# Patient Record
Sex: Male | Born: 1970 | Race: White | Hispanic: No | Marital: Single | Smoking: Current every day smoker
Health system: Southern US, Academic
[De-identification: ages and names within clinical notes are randomized; demographics above are authoritative.]

## PROBLEM LIST (undated history)

## (undated) DIAGNOSIS — I864 Gastric varices: Secondary | ICD-10-CM

## (undated) DIAGNOSIS — I219 Acute myocardial infarction, unspecified: Secondary | ICD-10-CM

## (undated) DIAGNOSIS — K709 Alcoholic liver disease, unspecified: Secondary | ICD-10-CM

## (undated) DIAGNOSIS — K449 Diaphragmatic hernia without obstruction or gangrene: Secondary | ICD-10-CM

## (undated) DIAGNOSIS — F1910 Other psychoactive substance abuse, uncomplicated: Secondary | ICD-10-CM

## (undated) DIAGNOSIS — K7460 Unspecified cirrhosis of liver: Secondary | ICD-10-CM

## (undated) DIAGNOSIS — K279 Peptic ulcer, site unspecified, unspecified as acute or chronic, without hemorrhage or perforation: Secondary | ICD-10-CM

## (undated) DIAGNOSIS — I10 Essential (primary) hypertension: Secondary | ICD-10-CM

## (undated) DIAGNOSIS — I8500 Esophageal varices without bleeding: Secondary | ICD-10-CM

## (undated) HISTORY — PX: SINUS SURGERY: SHX187

## (undated) HISTORY — PX: HX GALL BLADDER SURGERY/CHOLE: SHX55

## (undated) HISTORY — PX: NOSE SURGERY: SHX723

---

## 1995-08-12 ENCOUNTER — Other Ambulatory Visit (HOSPITAL_COMMUNITY): Payer: Self-pay

## 2002-12-16 ENCOUNTER — Emergency Department (HOSPITAL_COMMUNITY): Payer: Self-pay

## 2015-10-21 ENCOUNTER — Encounter (HOSPITAL_COMMUNITY): Payer: Self-pay

## 2015-10-21 NOTE — Ancillary Notes (Addendum)
[facility] Record for: Steven Parrish, Steven Parrish  Created: 10/21/2015 11:14:46 AM  MRN: [id number]  DOB: [birth year]  SSN: [id number]  Sex: Male  Height: 5 Feet 11 Inches - Weight: 199  Maiden Name:   Address:   [address]  [place]: [place], [place] [address]  E-Mail:   Day Phone: [phone] Night Phone:  Other Phone: [phone]  Phone comments:   Call Back time:   Authorized contact: no one  Intake Date: 10/21/2015 11:14:46 AM  PCP: Marcellus Scott   RefMD: Limmie Patricia, Ladonna   Primary Insurance: Medicaid - [place] [employer]  Secondary Insurance:   Insurance Comments: **Offer Mychart**     -------Referral Assignment------  Where was the original referral directed?: Spine Center  Was a specific reviewer requested?: Unassigned Referral  How was the reviewer selected?:   Who requested the specific reviewer?:   How did you hear of our spine program?:   Is this a second opinion?:      -------Web Challenge Info----------  [employer]:   City of Birth:      ---------- 1st Review ----------     Review Date: 10/30/2015 9:35:39 AM  Review Completed by: Anice Paganini  Impression: Neck Pain  Disposition: Other Treatment or Testing  Appt w/ Colleague:   Colleague Name:   Appt How Soon:   Pre Treatment Type: MRI  Pre Treatment Type Details: MRI Type: Cervical   Pre Treatment Other Test:   Appt Type:   Instructions: 45 yo male who notes midback-to-arm pain for 15 years.  MRi thoracic demonstrates old compression fracture consistent with his prolonged duration of symptoms.  This is non-surgical.  However with pain radiating into the arm I wonder if he actually has cervical radiculopathy radiating from periscapular region into the arm - MRI cervical would be useful.  I can re-review after the study.     ---------- 2nd Review ----------     Review Date: 11/07/2015 6:53:30 AM  Review Completed by: Anice Paganini  Impression: Neck Pain  Disposition: Appointment/Treatment with Colleague  Appt w/ Colleague: First Available  Appointment  Colleague Name: Pain Management  Appt How Soon:   Pre Treatment Type:   Pre Treatment Type Details:   Pre Treatment Other Test:   Appt Type:   Instructions: MRI cervical has no surgical pathology.  I recommend he consider injections through the pain clinic for his longstanding midback pain     ---------- Symptoms ----------     Chief complaint: 1/2 pack to 1 pack cigarettes a dayneck pain  Diagnosis from Other MD:      Symptoms: Pain,Numbness and/or tingling,Muscular weakness  Other Symptom Description:   Pain Location: Neck,Mid back,Arm  Other Pain Location Description:   Where is your pain the worst?:   Pain Type: Sharp/Stabbing  Pain Rating: 10  Does the pain radiate to other parts of your body? Yes   Radiate Where: From neck to left arm,Other  Other Description: "patient feels like pain radiates from mid back to left arm"  Does it radiate to the fingers?   Does it radiate below the elbow?   Which specific part of your arm?   Which fingers?   Which part of your arm does pain go to?   How does it radiate to the arm?   Does it radiate to the toes?   Does it radiate below the knee?   Which specific part of your leg?   Which toes?   Which part of your leg does  pain go to?   How does it radiate to the leg?   Additional pain information with no activity pain is about a 6with activity pain is 10neck pain; headaches; left arm pain, thoracic pain - pain is all about the samearm pain is aching pain,  neck and thoracic pain is sharp/stabbing pain     Location of Numbness/tingling: Arm  Other Description:  Does numbness/tingling radiate to other parts of your body?:Yes  Where does the numbess/tingling radiate?:From neck to left arm  Other Description:  Does the numbness/tingling radiate below your elbow?:Yes  Which specific part of your arm?:Left Wrist  Which fingers:  Which pat of your arm does the numbness/tingling go to?:  How does the numbness/tingling radiate to your arm?:  Does the numbness/tingling  radiate below your knee?:  Which specific part of your leg?:  Which toes:  Which part of your leg does the numbness/tingling go to?:  How does the numbness/tingling radiate to your leg?:  Additional Numbness/tingling information:"feel like numbness/tingling radiates all over left arm"  Other Description:      Location of Weakness: Right hand,Left hand  Other Description:   Additional Weakness Information: trouble with gripping items with bil handsno trouble with buttons, tying shoes, opening bottles or turning knobs     The symptoms have been present for: more than 1 year  The symptoms began: 15 years now  Was there a specific event that caused your symptoms?: Aggravation of a previous injury  Additional Narrative Description:   Are you able to perform your daily activities with these symptoms?:   Since what date have you been unable to perform your daily routine?:   The symptoms improve when you: Never improve  Other activities that improve your symptoms:   The symptoms worsen when you: Never worsen  Other activities that worsen your symptoms:      ---------- Work History ----------     Are you able to work?: Does not apply  Reasons for not working: Unemployed  Other Reason for not working:   Do you have Work Restrictions:   If applicable, maximum lifting restriction:   How long have you been unable to work:   Have you ever filed a W/C claim related to a neck or back injury?: no   Occupation:   Other Occupation Information:      ---------- Bowel/Bladder/Incontinence Issues ----------     Since the onset of symptoms, have you experienced any new problems urinating or having bowel movements?: No  Description:   Other Description:   How long have you had these bowel/bladder problems?:      ---------- Allergies ----------     Do you have any medication allergies? Yes  Allergies: Sulfa  Other Details:   Allergic to Latex?: NKA  Allergic to intravenous contrast dyes?: NKA  Allergic to steroids?: NKA     ----------  Treatment/Testing ----------      Taking prescription medication for this problem?: Yes  Are you using any now?: Yes  Medication: Lortab; MD Name: Marcellus Scott; Date Prescribed: ; Dosage: 10 mg; Times/Day: hasn't started just dropped off RX today  Have you received a Medrol dose pack for this problem?: Yes  When did you last take the dosepack?: last year  Were your symptoms improved?: No  Would you describe your relief as:   How long did you experience that amount of relief?:   Other Medrol dosepack information:      --------------- PT ---------------  PT: No  When Received:   Where Received:   Other where received information:   Visits:   Types:   Other Types:   Improved:      If improved, describe level of relief:   If improved, how long did you experience relief:   Other Physical Therapy Treatment Information:      ---------- Chiropractic Services ----------     Chiro: No  When:   Who:   Visits:   Types:   Other Types:   Were Symptoms Improved:   If improved, describe level of relief:   If improved, how long did you experience relief:   Other Chiropractic Treatment Information:      --------------- ESI ---------------     ESI: No  When:   Who:   Visits:   Types:   Improved:   If improved, describe level of relief:   If improved, how long did you experience relief:   Other Injection Treatment Information:      ---------- Diagnostic Tests ----------     MRI [facility] On:10/30/15 [facility]  Area Scanned: Cervical  Do you have any of the following in case an MRI is ordered?:   Other MRI factors:      ---------- Past Medical History ----------     What other doctors/providers have treated you for these spine issues? Marcellus ScottBowling DO, Ladonna Specialty: Family Medicine Date: 10/2015    Ever diagnosed with Spine deformity?:   Prior neck or back surgery (1)?: No  When:   Who:   Area of neck/spine operated on:   Level:   Were symptoms improved?:   If improved, describe level of relief:  If improved, how  long did you experience relief?:   Prior neck or back surgery (2)?:   When:   Who:   Area of neck/spine operated on:   Level:   Were symptoms improved?:   If improved, describe level of relief:  If improved, how long did you experience relief?:   Prior neck or back surgery (3)?:   When:   Who:   Area of neck/spine operated on:   Level:   Were symptoms improved?:   If improved, describe level of relief:  If improved, how long did you experience relief?:   Do you have any of the following assistive devices? None  How long have you required these assistive devices?   Currently being treated for any other medical condition?: Yes  Conditions: Hypertension  Other Conditions:   Type of neurologic disorder:   Cancer Type:   Other Treatment/Medication: Norvasc  What blood thinners are you currently taking?: None  Which physicians are treating you for your other medical conditions?: Bowling DO,Ladonna  Do you smoke?: Yes  Are you pre-menopausal or post-menopausal?:   If recommended, are you willing to consider surgery?: Yes  What is your goal in seeking treatment?: to get pain relief and better quality of life  Other pertinent information/ general comments/ goals for treatment: office note and report on file/ DBraggno hx of cano other studies     ---------- Care Coordinator Information ----------     Care Coordinator: RS  Activity Log: 10/30/2015 10:51:11 AM Misty Stanley[Lisa Herod]:  Attempted to contact patient to discuss MD initial impression and recommendations.  No answer and no machine. Received message "Voicemail not set up yet."  Marcelina MorelLisa Herod, RN9/20/2017 1:16:47 PM Misty Stanley[Lisa Herod]: Phone call to patient. Reviewed patient's medical history.  Patient states no change in symptoms.  Discussed MD initial  impression and recommendations:  MRI cervical would be useful.  I (Dr. Rod Holler) can re-review after the study.   Provided education on 45 yo male who notes midback-to-arm pain for 15 years.  MRI thoracic demonstrates old compression  fracture consistent with his prolonged duration of symptoms.  This is non-surgical.  However with pain radiating into the arm I wonder if he actually has cervical radiculopathy radiating from periscapular region into the arm.  Patient chose to accept recommendations.  States he just completed cervical MRI today at [facility].  Will request images and set for re-review once received.  L. Herod RN9/28/2017 8:57:41 AM Misty Stanley Herod]: Phone call to patient. Reviewed patient's medical history.  Patient states no change in symptoms.  Discussed MD initial impression and recommendations:  I (Dr. Rod Holler) recommend he consider injections through the pain clinic for his longstanding midback pain.  Provided education on MRI cervical has no surgical pathology.  Patient chose to decline recommendations.  States he is not doing any spine injections.   Explained that a letter will be faxed to the PCP/referring physician regarding this conversation. Patient verbalized understanding. Marcelina Morel, RN     Letter/Test Coordinator: Letters  Letter/Test Coordinator Log: 10/21/2015 12:16:53 PM Elnita Maxwell Chidester]: Completed intake.  Transferred patient to Eunice Blase in  pre-reg to update insurance information. CChidester. 11/01/2015 10:53:46 AM [Dawn Bragg]: Looks like images were uploaded to [employer] but no report. I called and spoke with Alexander Bergeron at [facility] and they stated MRI report was not ready yet. Will try again Monday. DBragg  11/07/2015 9:32:21 AM Alvis Lemmings Bragg]: Declined appointment letter faxed to Dr. Suzette Battiest. DBragg     Intake Specialist Comments:      Clinic Staff Comments:      Last Edited byJerilynn Birkenhead on 11/14/2015 2:09:02 PM  Last Review by: Anice Paganini on

## 2020-04-25 ENCOUNTER — Other Ambulatory Visit (HOSPITAL_COMMUNITY): Payer: Self-pay

## 2020-04-25 LAB — EXTERNAL COVID-19 MOLECULAR RESULT: External 2019-n-CoV/SARS-CoV-2: POSITIVE — AB

## 2020-08-17 IMAGING — MR MRI KNEE LT W/O CONTRAST
4 series · 26 of 40 positions shown · IV contrast (gadolinium)
Comparison: None available.

﻿EXAM:  [id number]   MRI KNEE LT W/O CONTRAST
INDICATION: Pain and instability.
TECHNIQUE: Multiplanar multisequential MRI of the left knee joint was performed without gadolinium contrast.

[Series 5: PD fat-sat · axial · left · 4.0mm · 0.37mm/px · z∈[-114,+16]mm · 11 of 30 slices shown (1 of 2)]
[im 1/30]
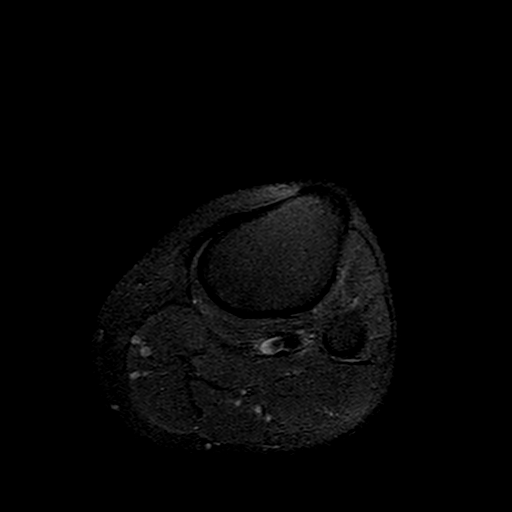
[im 3/30]
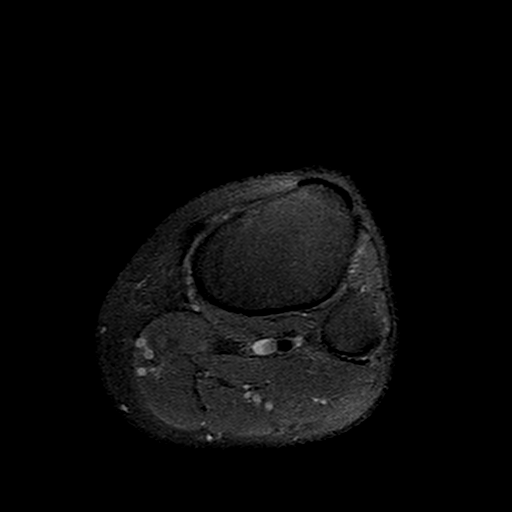
[im 6/30]
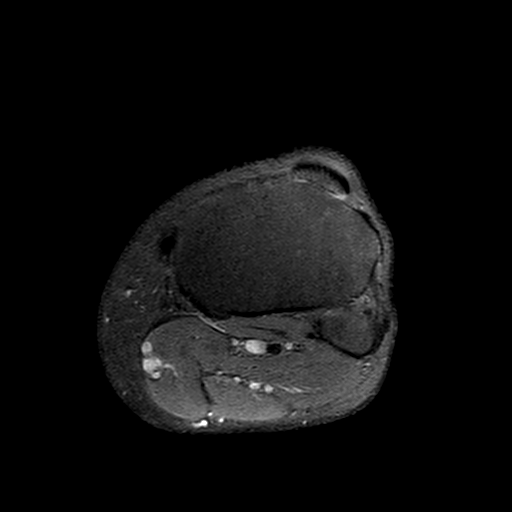
[im 9/30]
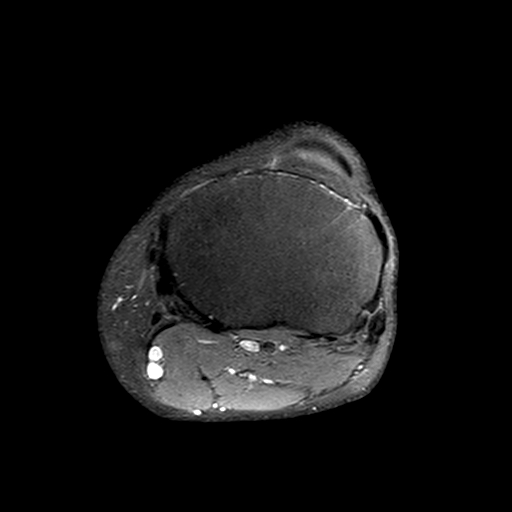
[im 12/30]
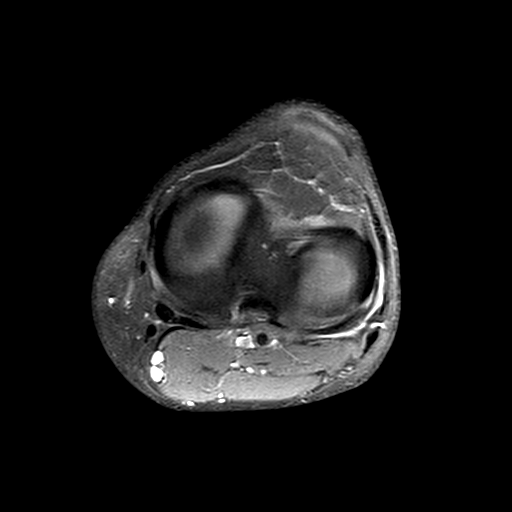
[im 15/30]
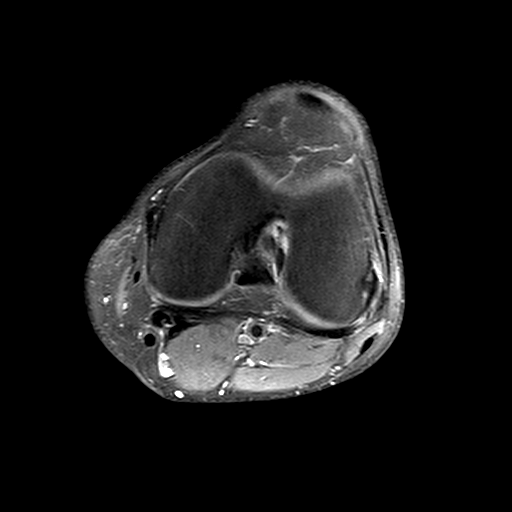
[im 18/30]
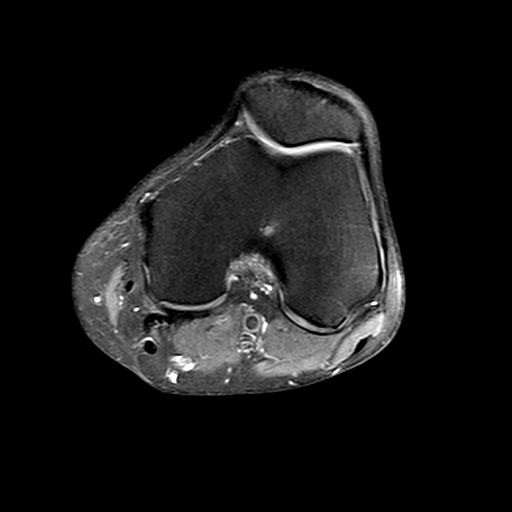
[im 21/30]
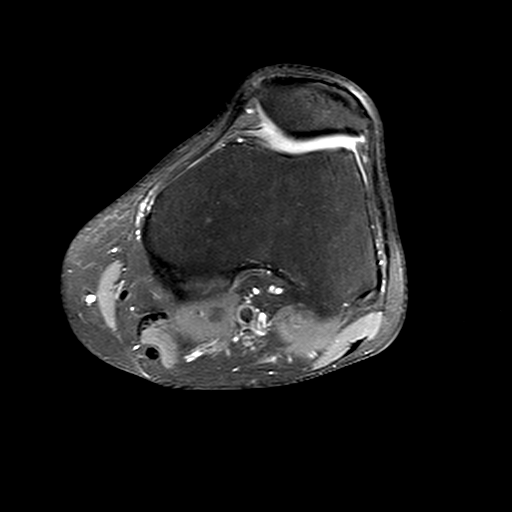
[im 24/30]
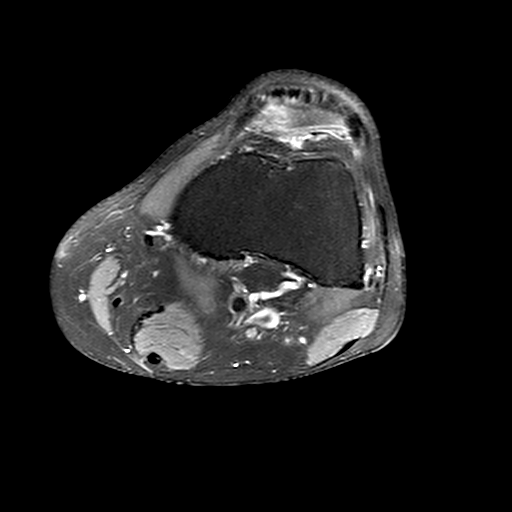
[im 27/30]
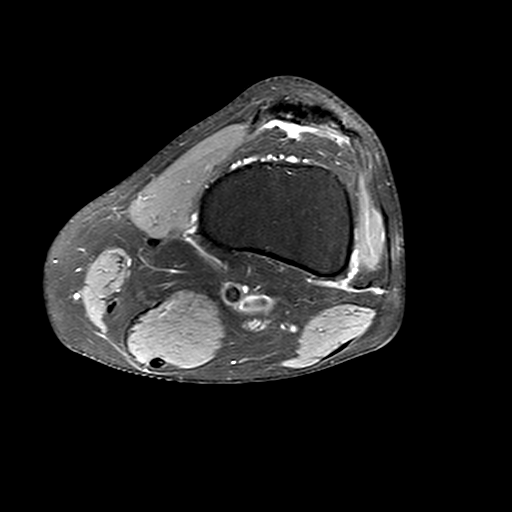
[im 30/30]
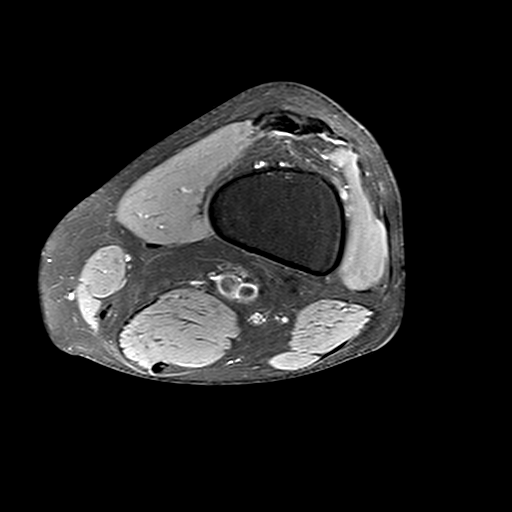

[Series 6: PD fat-sat · sagittal · left · 3.0mm · 0.50mm/px · 8 of 30 slices shown (2 of 2)]
[im 1/30]
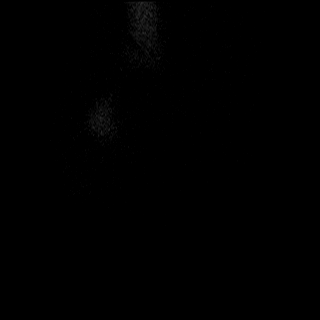
[im 4/30]
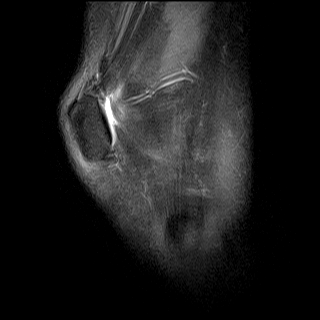
[im 10/30]
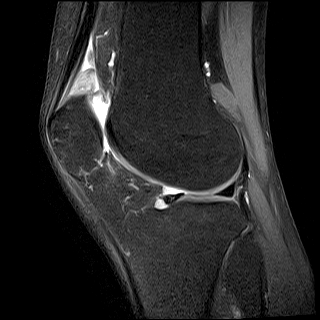
[im 13/30]
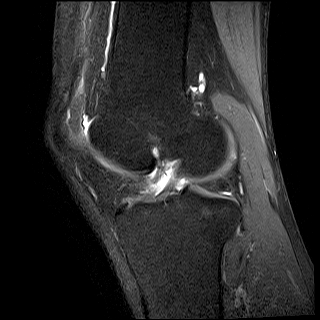
[im 17/30]
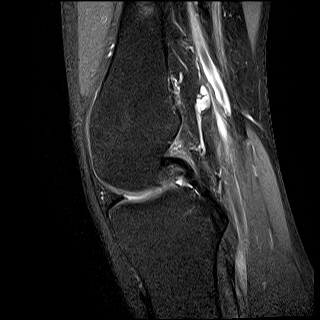
[im 20/30]
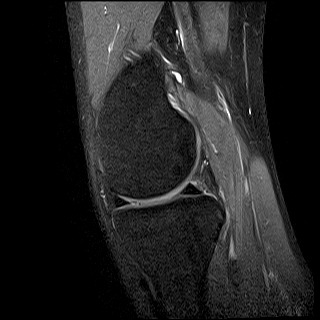
[im 26/30]
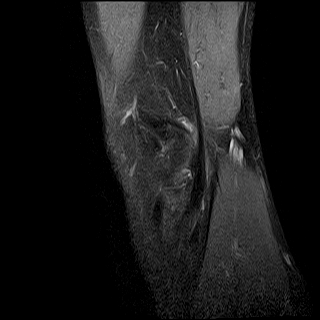
[im 30/30]
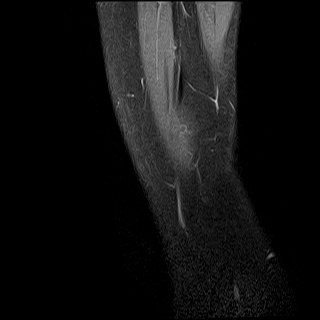

[Series 7: T1 · sagittal · left · 3.0mm · 0.31mm/px · 4 of 30 slices shown]
[im 1/30]
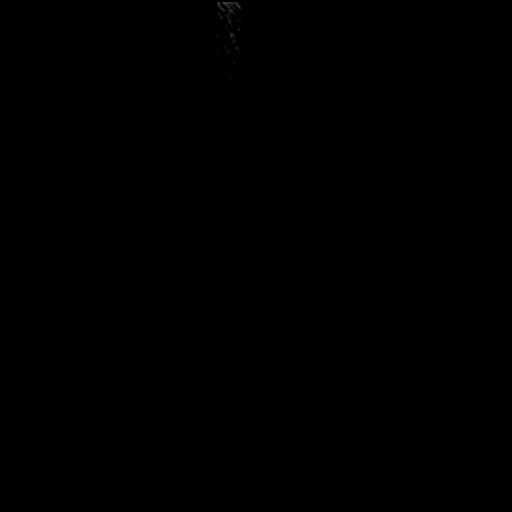
[im 4/30]
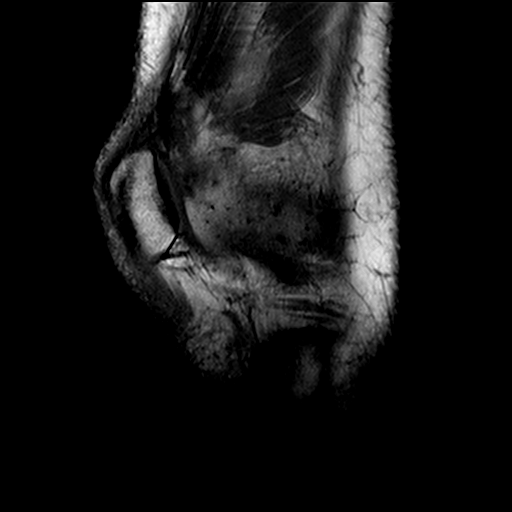
[im 17/30]
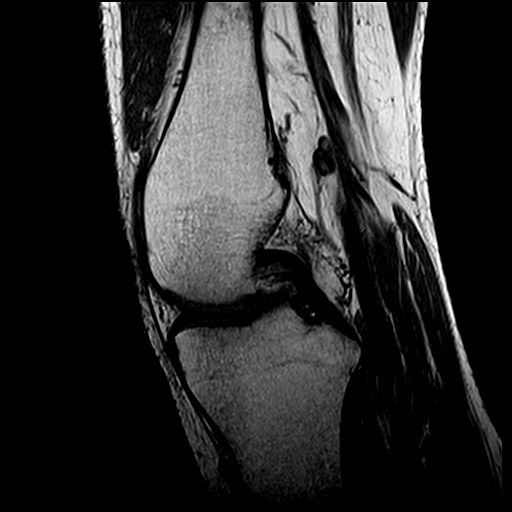
[im 26/30]
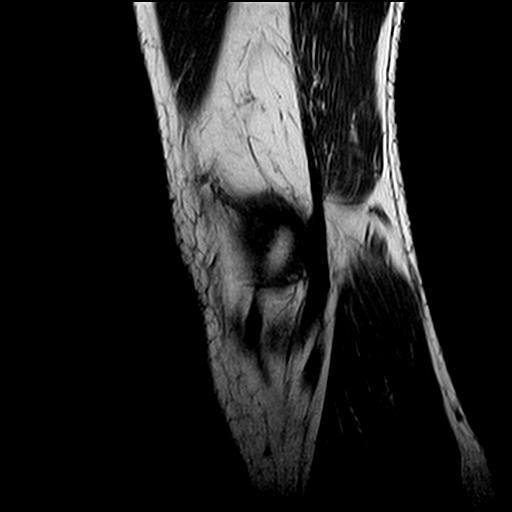

[Series 8: STIR · coronal · left · 3.0mm · 0.50mm/px · 3 of 27 slices shown]
[im 4/27]
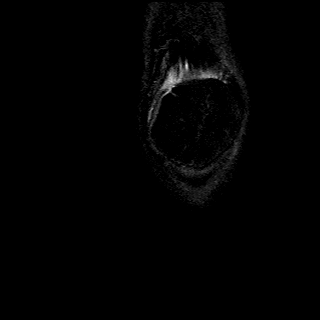
[im 14/27]
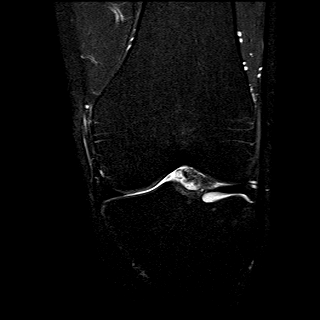
[im 23/27]
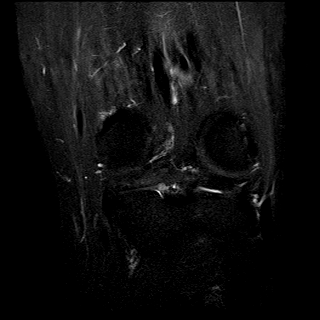

[26 of 40 positions shown; findings below may reference images not displayed]

FINDINGS: There is suggestion of a subtle horizontal tear involving the posterior horn of the medial meniscus. Lateral meniscus, cruciate and collateral ligaments are intact, within normal limits in morphology and signal intensity. Hyaline cartilage of the tibiofemoral and patellofemoral articulations is well maintained. Mild edema is noted within the anterior suprapatellar fat pad. Extensor mechanism is intact. Capsular attachments appear unremarkable. Bone marrow signal intensity is normal. There is no suprapatellar effusion or Baker's cyst.
IMPRESSION: 1. Suggestion of a subtle horizontal tear involving the posterior horn of the medial meniscus. 

2. Mild anterior fat pad edema concerning for fat pad impingement syndrome.

## 2020-08-17 IMAGING — MR MRI KNEE RT W/O CONTRAST
4 of 5 series · 21 of 40 positions shown · IV contrast (gadolinium)
Comparison: None available.

﻿EXAM:  [id number]   MRI KNEE RT W/O CONTRAST
INDICATION: Pain and instability.
TECHNIQUE: Multiplanar multisequential MRI of the right knee joint was performed without gadolinium contrast.

[Series 5: PD fat-sat · axial · right · 4.0mm · 0.37mm/px · z∈[-113,+17]mm · 8 of 30 slices shown (1 of 3)]
[im 1/30]
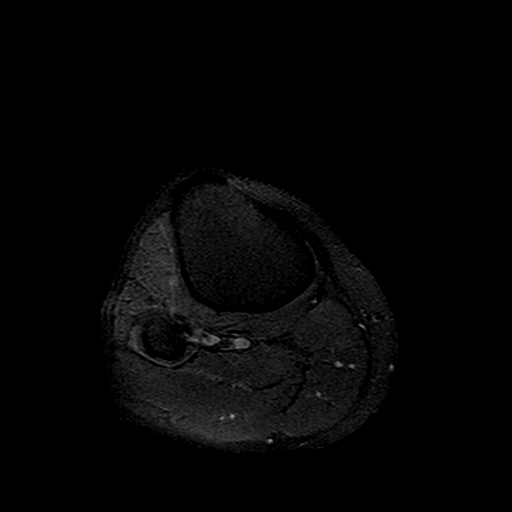
[im 5/30]
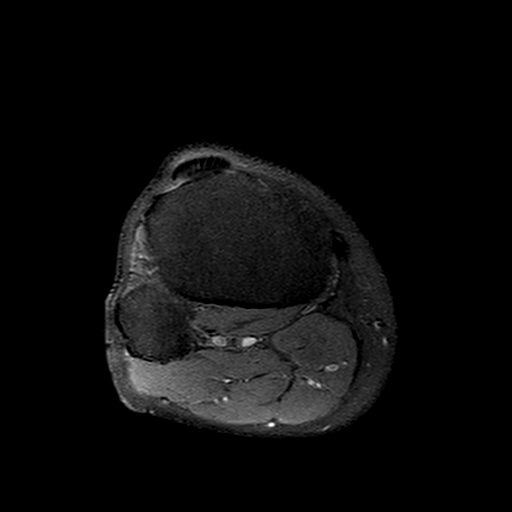
[im 9/30]
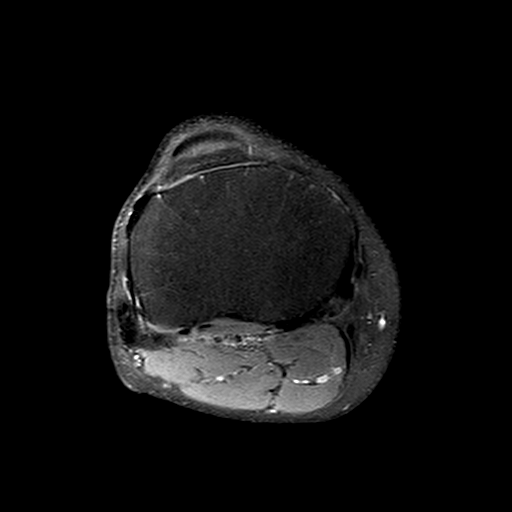
[im 13/30]
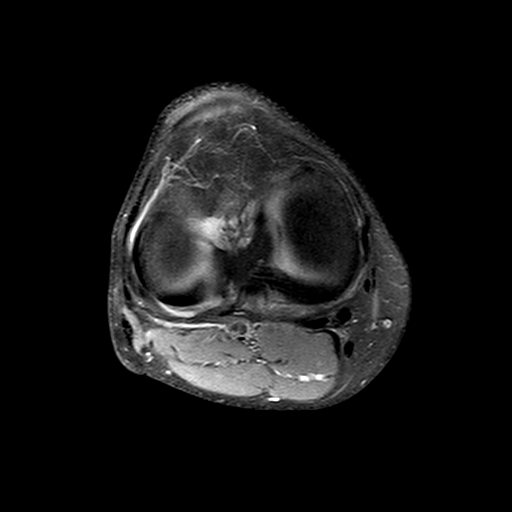
[im 17/30]
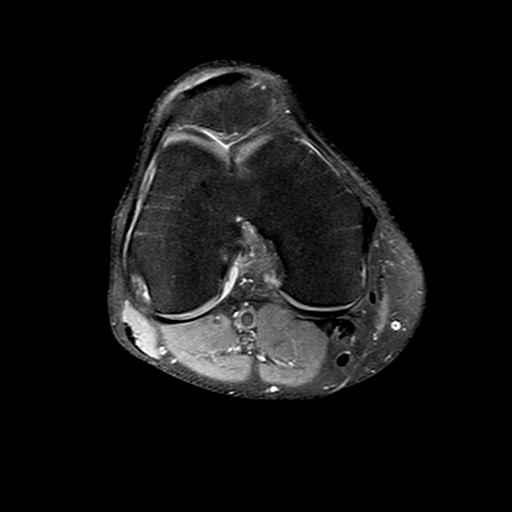
[im 21/30]
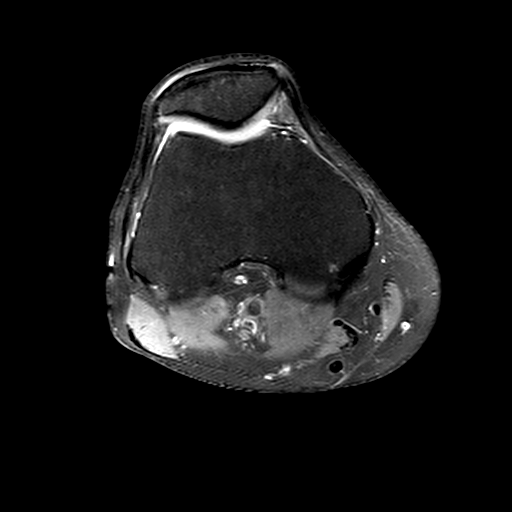
[im 25/30]
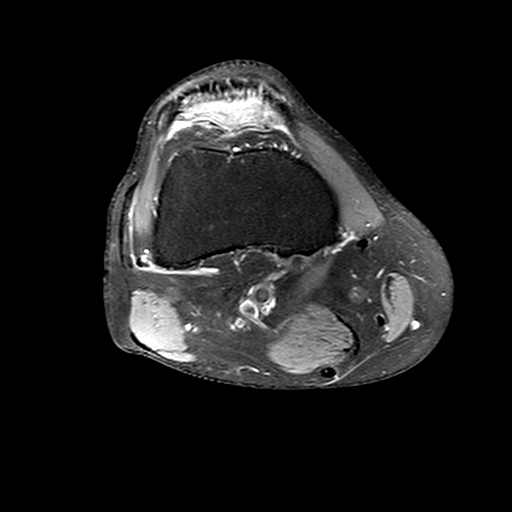
[im 30/30]
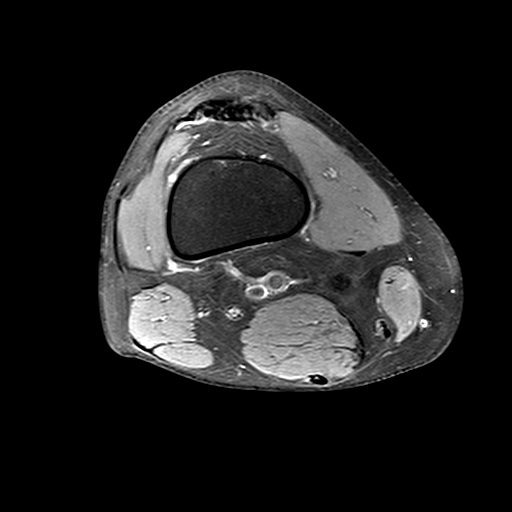

[Series 6: PD fat-sat · sagittal · right · 3.0mm · 0.31mm/px · 7 of 30 slices shown (2 of 3)]
[im 1/30]
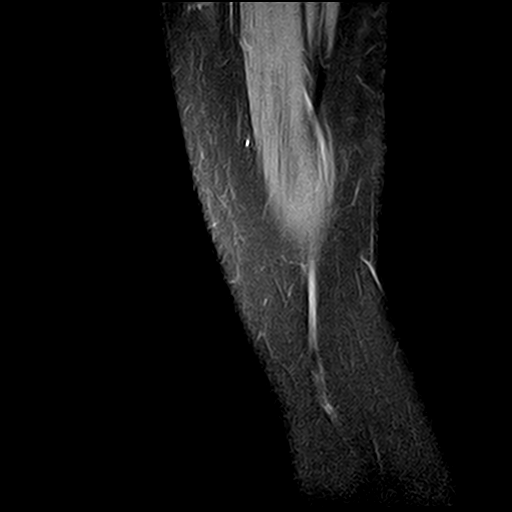
[im 5/30]
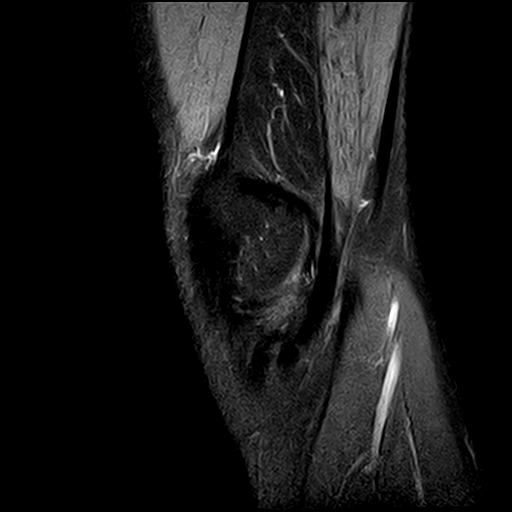
[im 9/30]
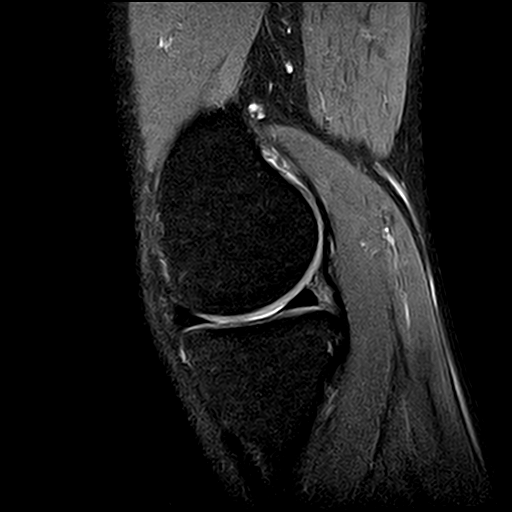
[im 13/30]
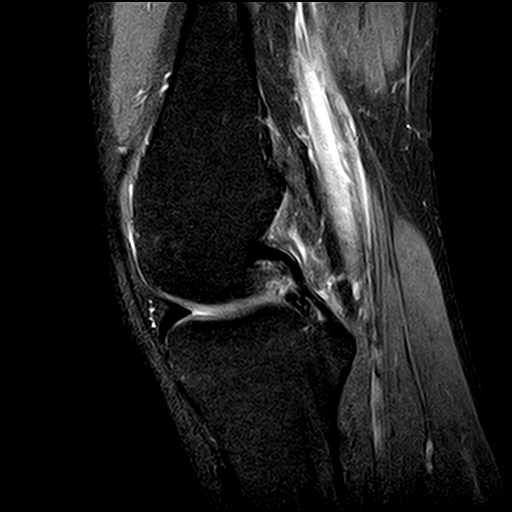
[im 17/30]
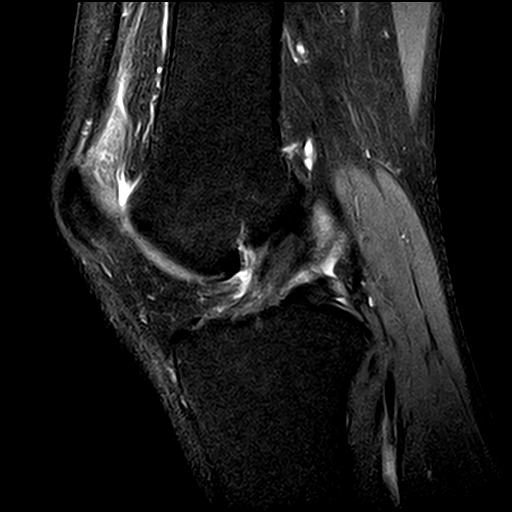
[im 21/30]
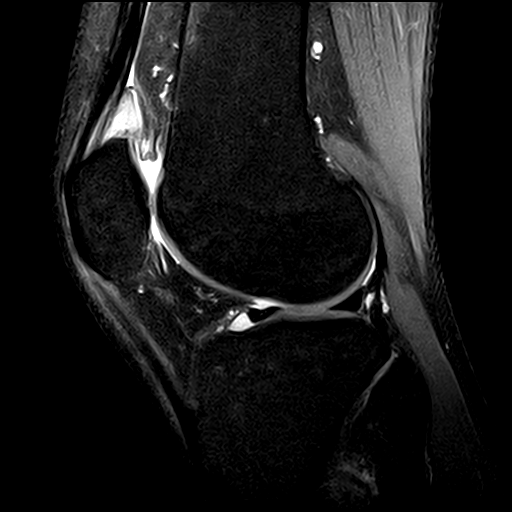
[im 25/30]
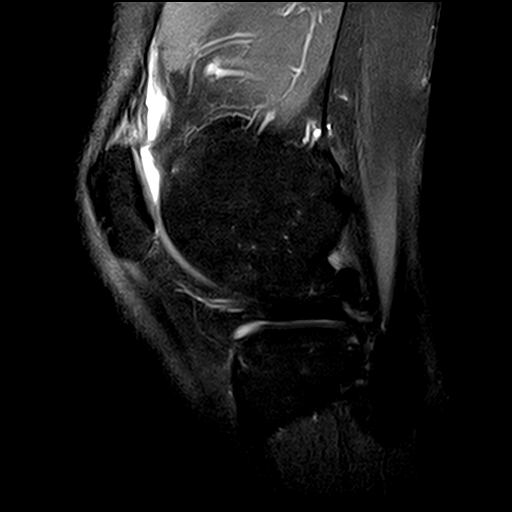

[Series 7: T1 · sagittal · right · 3.0mm · 0.31mm/px · 3 of 30 slices shown]
[im 5/30]
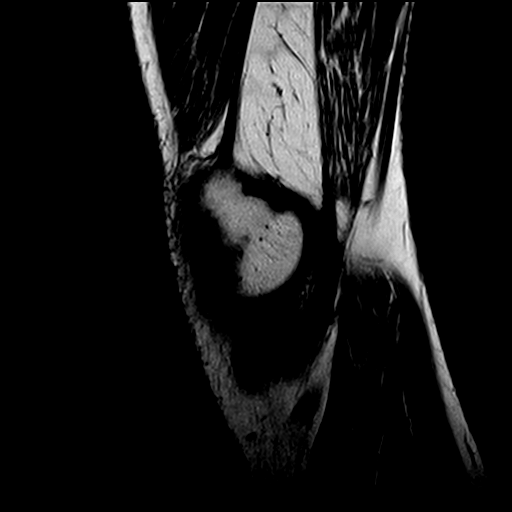
[im 17/30]
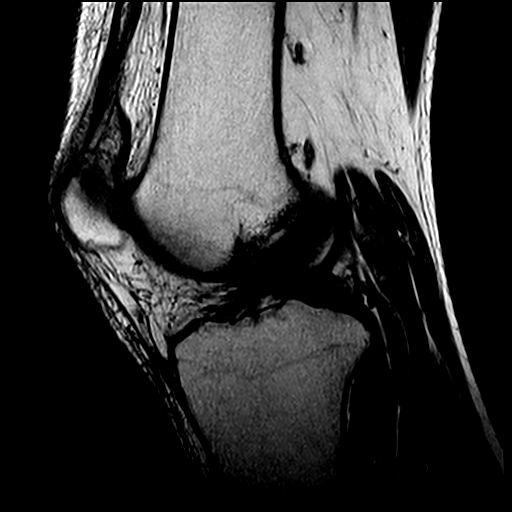
[im 25/30]
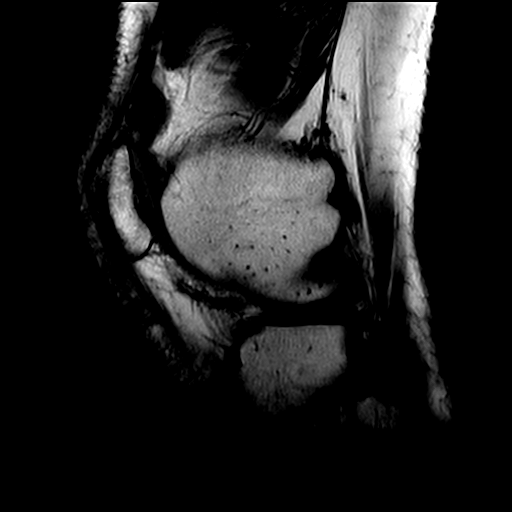

[Series 9: PD fat-sat · coronal · right · 3.0mm · 0.50mm/px · 3 of 27 slices shown (3 of 3)]
[im 4/27]
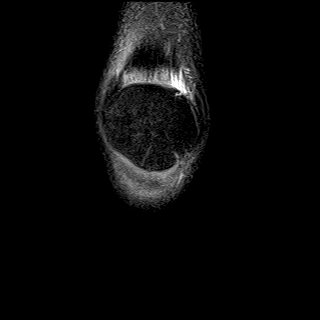
[im 15/27]
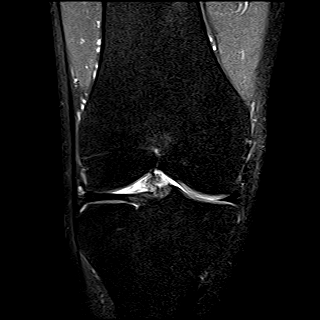
[im 23/27]
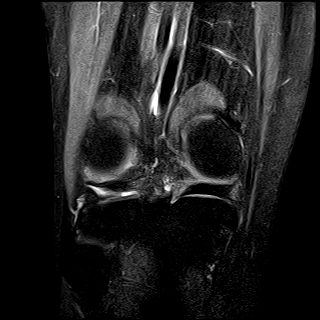

[21 of 40 positions shown; findings below may reference images not displayed]

FINDINGS: There is suggestion of a subtle horizontal tear involving the body of the medial meniscus. Lateral meniscus, cruciate and collateral ligaments are intact, within normal limits in morphology and signal intensity. Hyaline cartilage of the tibiofemoral and patellofemoral articulations is well maintained. Extensor mechanism is intact. Capsular attachments appear unremarkable. There is moderate edema within the anterior suprapatellar fat pad. Bone marrow signal intensity is normal. There is no suprapatellar effusion or Baker's cyst.
IMPRESSION: 1. Suggestion of a subtle horizontal tear involving the body of the medial meniscus. 

2. Moderate edema within the anterior suprapatellar fat pad suggestive of fat pad impingement syndrome.

## 2020-08-17 IMAGING — MR MRI LUMBAR SPINE WITHOUT CONTRAST
5 of 6 series · 32 of 48 positions shown · IV contrast (gadolinium)
Comparison: None available.

﻿EXAM:  [id number]   MRI LUMBAR SPINE WITHOUT CONTRAST
INDICATION: Lower back pain with bilateral lower extremity weakness.
TECHNIQUE: Multiplanar multisequential MRI of the lumbar spine was performed without gadolinium contrast.

[Series 5: T2 · sagittal · 4.0mm · 0.94mm/px · 6 of 15 slices shown (1 of 3)]
[im 1/15]
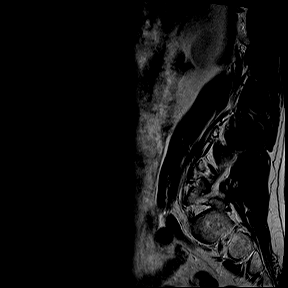
[im 3/15]
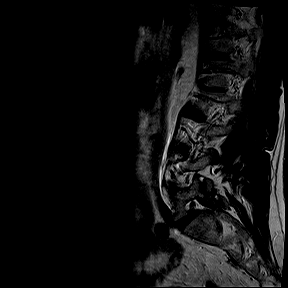
[im 6/15]
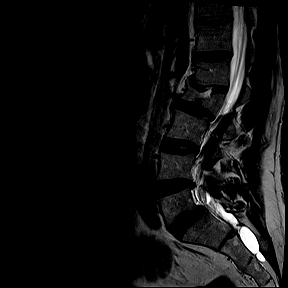
[im 9/15]
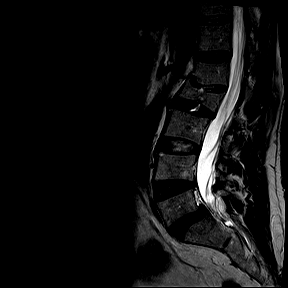
[im 12/15]
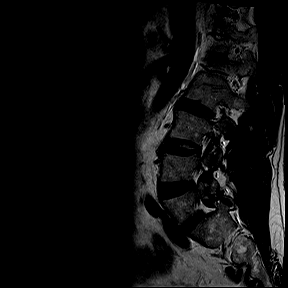
[im 15/15]
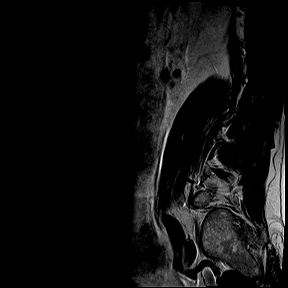

[Series 6: T1 · sagittal · 4.0mm · 0.94mm/px · 7 of 15 slices shown (1 of 2)]
[im 1/15]
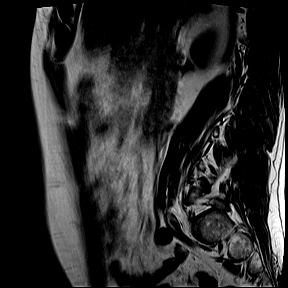
[im 3/15]
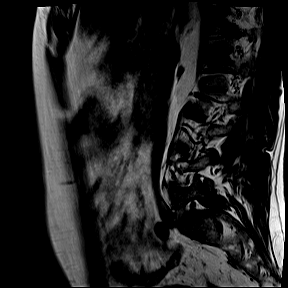
[im 5/15]
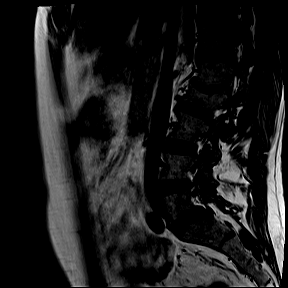
[im 8/15]
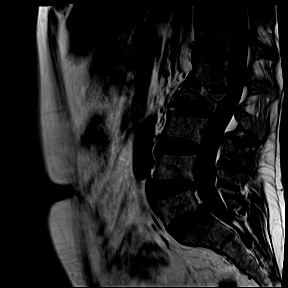
[im 10/15]
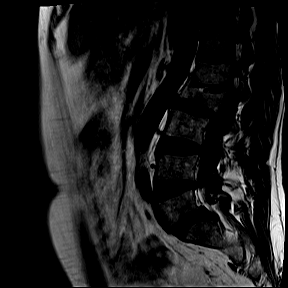
[im 12/15]
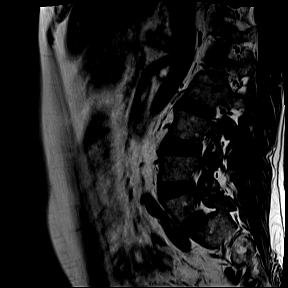
[im 15/15]
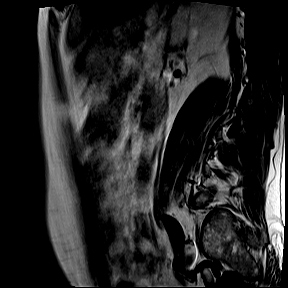

[Series 9: T2 · axial · 4.0mm · 0.52mm/px · z∈[-86,+113]mm · 8 of 23 slices shown (2 of 3)]
[im 1/23]
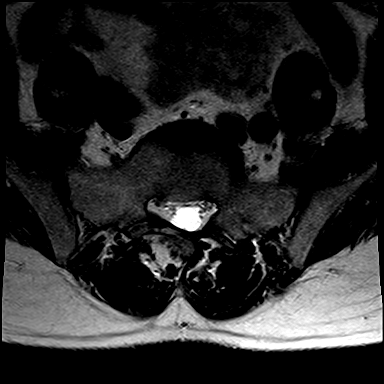
[im 3/23]
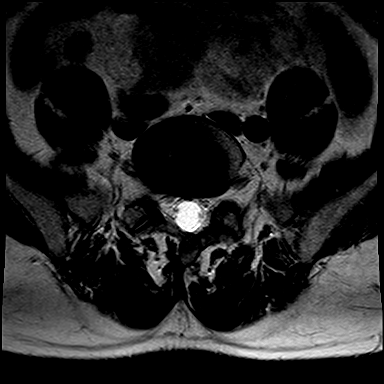
[im 8/23]
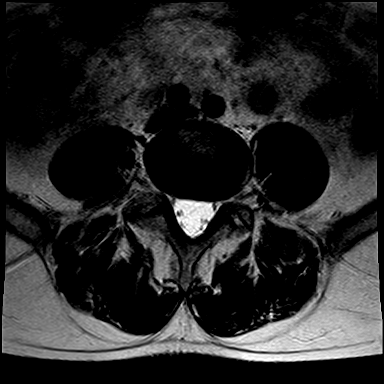
[im 10/23]
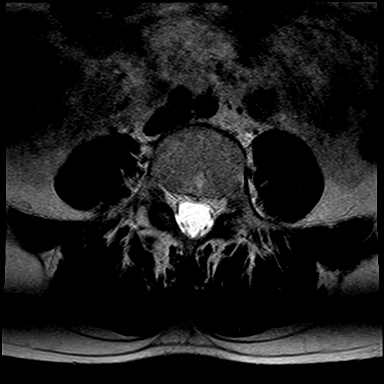
[im 13/23]
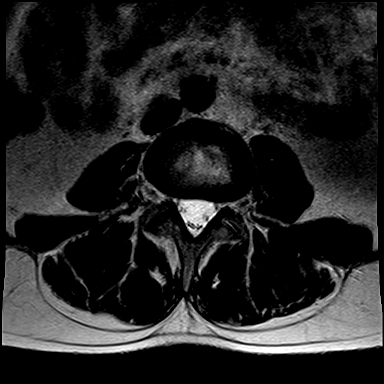
[im 15/23]
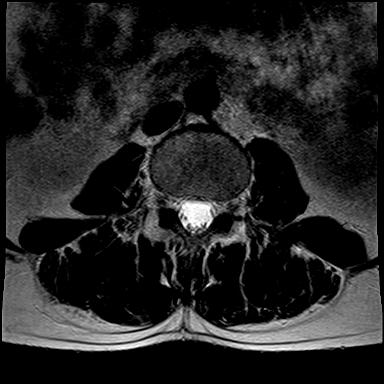
[im 20/23]
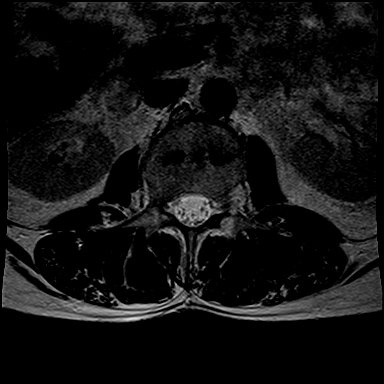
[im 23/23]
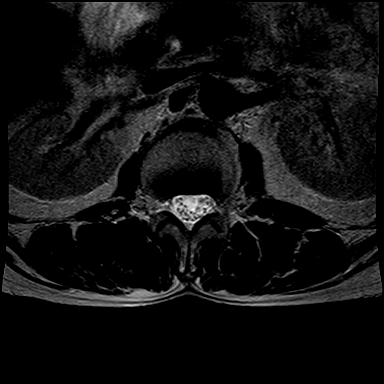

[Series 10: T1 · axial · 4.0mm · 0.52mm/px · z∈[-86,-19]mm · 3 of 23 slices shown (2 of 2)]
[im 1/23]
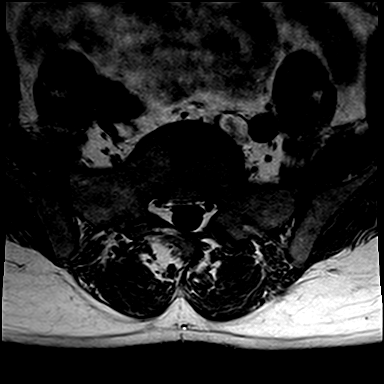
[im 3/23]
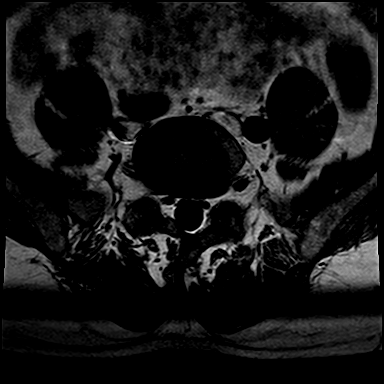
[im 8/23]
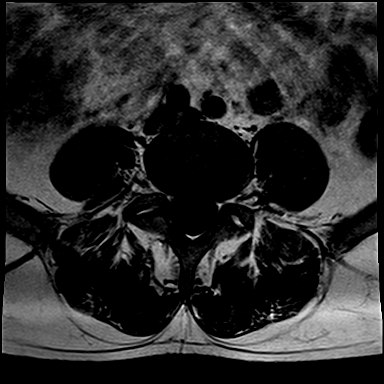

[Series 11: T2 · coronal · 5.0mm · 0.82mm/px · 8 of 18 slices shown (3 of 3)]
[im 1/18]
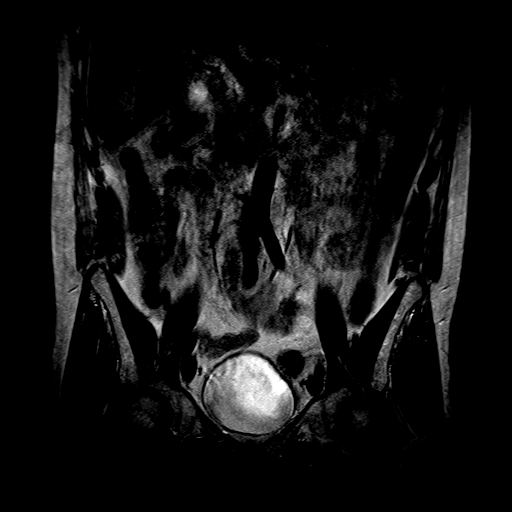
[im 3/18]
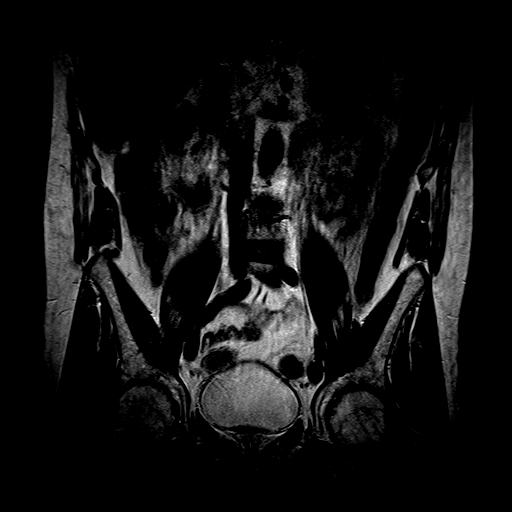
[im 5/18]
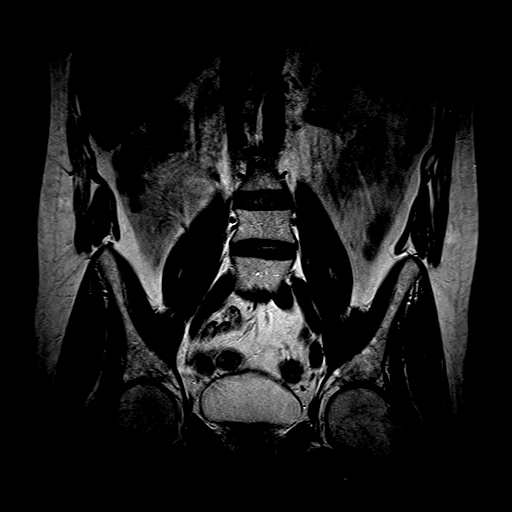
[im 8/18]
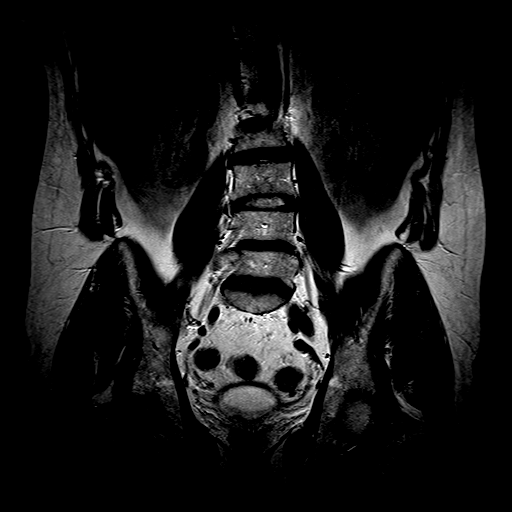
[im 10/18]
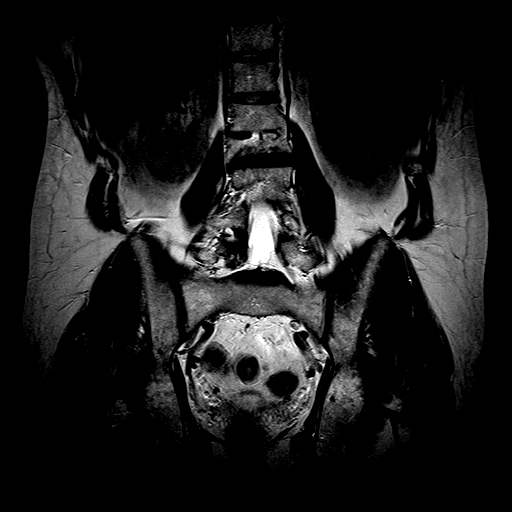
[im 13/18]
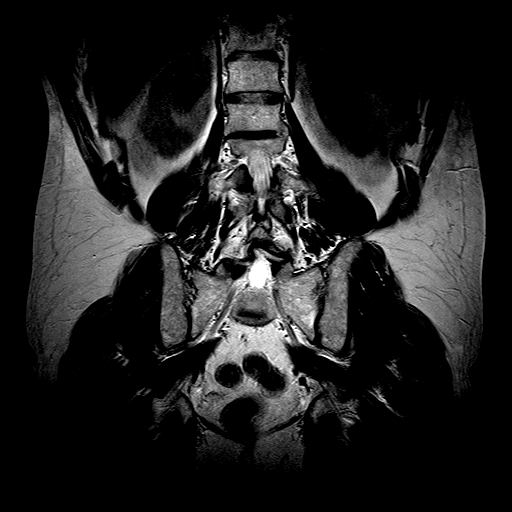
[im 15/18]
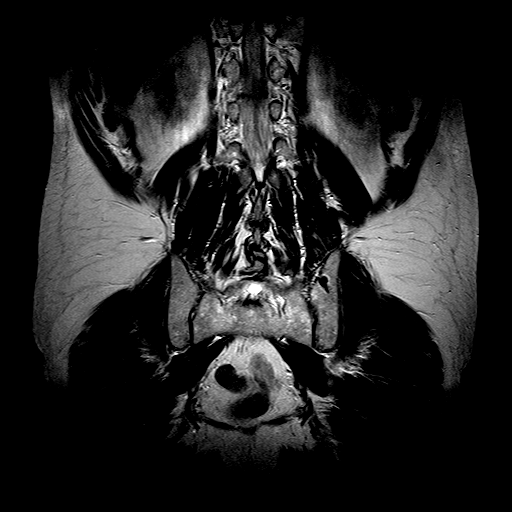
[im 18/18]
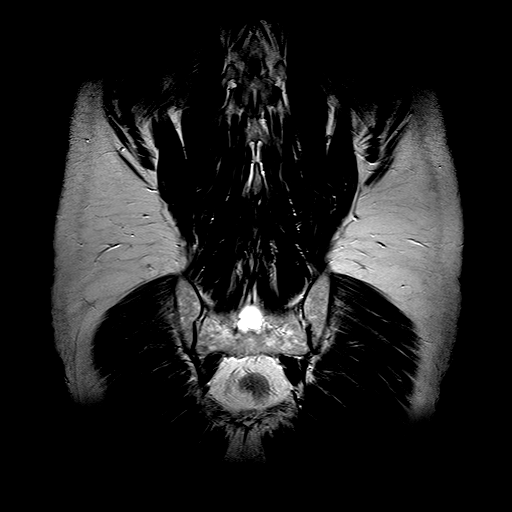

[32 of 48 positions shown; findings below may reference images not displayed]

FINDINGS: There is a moderate chronic L2 superior endplate compression fracture. Bone marrow signal intensity is normal. No acute fracture or subluxation is seen. Distal spinal cord is normal in signal intensity and terminates normally at T12-L1 disc space level.

L1-2, L2-3 and L3-4 levels are unremarkable.

At L4-5 level, there is minimal retrolisthesis of L4 on L5 vertebral body. There is a minimal bulging annulus, minimally effacing the ventral thecal sac. There is moderate bilateral neural foraminal stenosis from facet arthropathy and bulging annulus.

At L5-S1 level, there is minimal anterolisthesis of L5 on S1 vertebral body. There is a minimal bulging annulus without mass effect on the thecal sac. There is mild bilateral neural foraminal stenosis from facet arthropathy and bulging annulus.

Paraspinal soft tissues are unremarkable.
IMPRESSION: 1. Moderate chronic L2 superior endplate compression fracture, minimal retrolisthesis of L4 on L5 vertebral body and minimal anterolisthesis of L5 on S1 vertebral body. 

2. No significant disc herniation or spinal stenosis at any level. 

3. Multilevel neural foraminal stenosis as detailed above.

## 2020-12-13 IMAGING — CR ABDOMEN XRAY 1 VIEW
1 series · 2 of 2 positions shown · non-contrast
Comparison: None available.

﻿EXAM:  [id number]   ABDOMEN XRAY 1 VIEW
INDICATION: Upper abdominal pain.

[Series 1: view not recorded · 0.17mm/px · 2 of 2 slices shown]
[im 1/2]
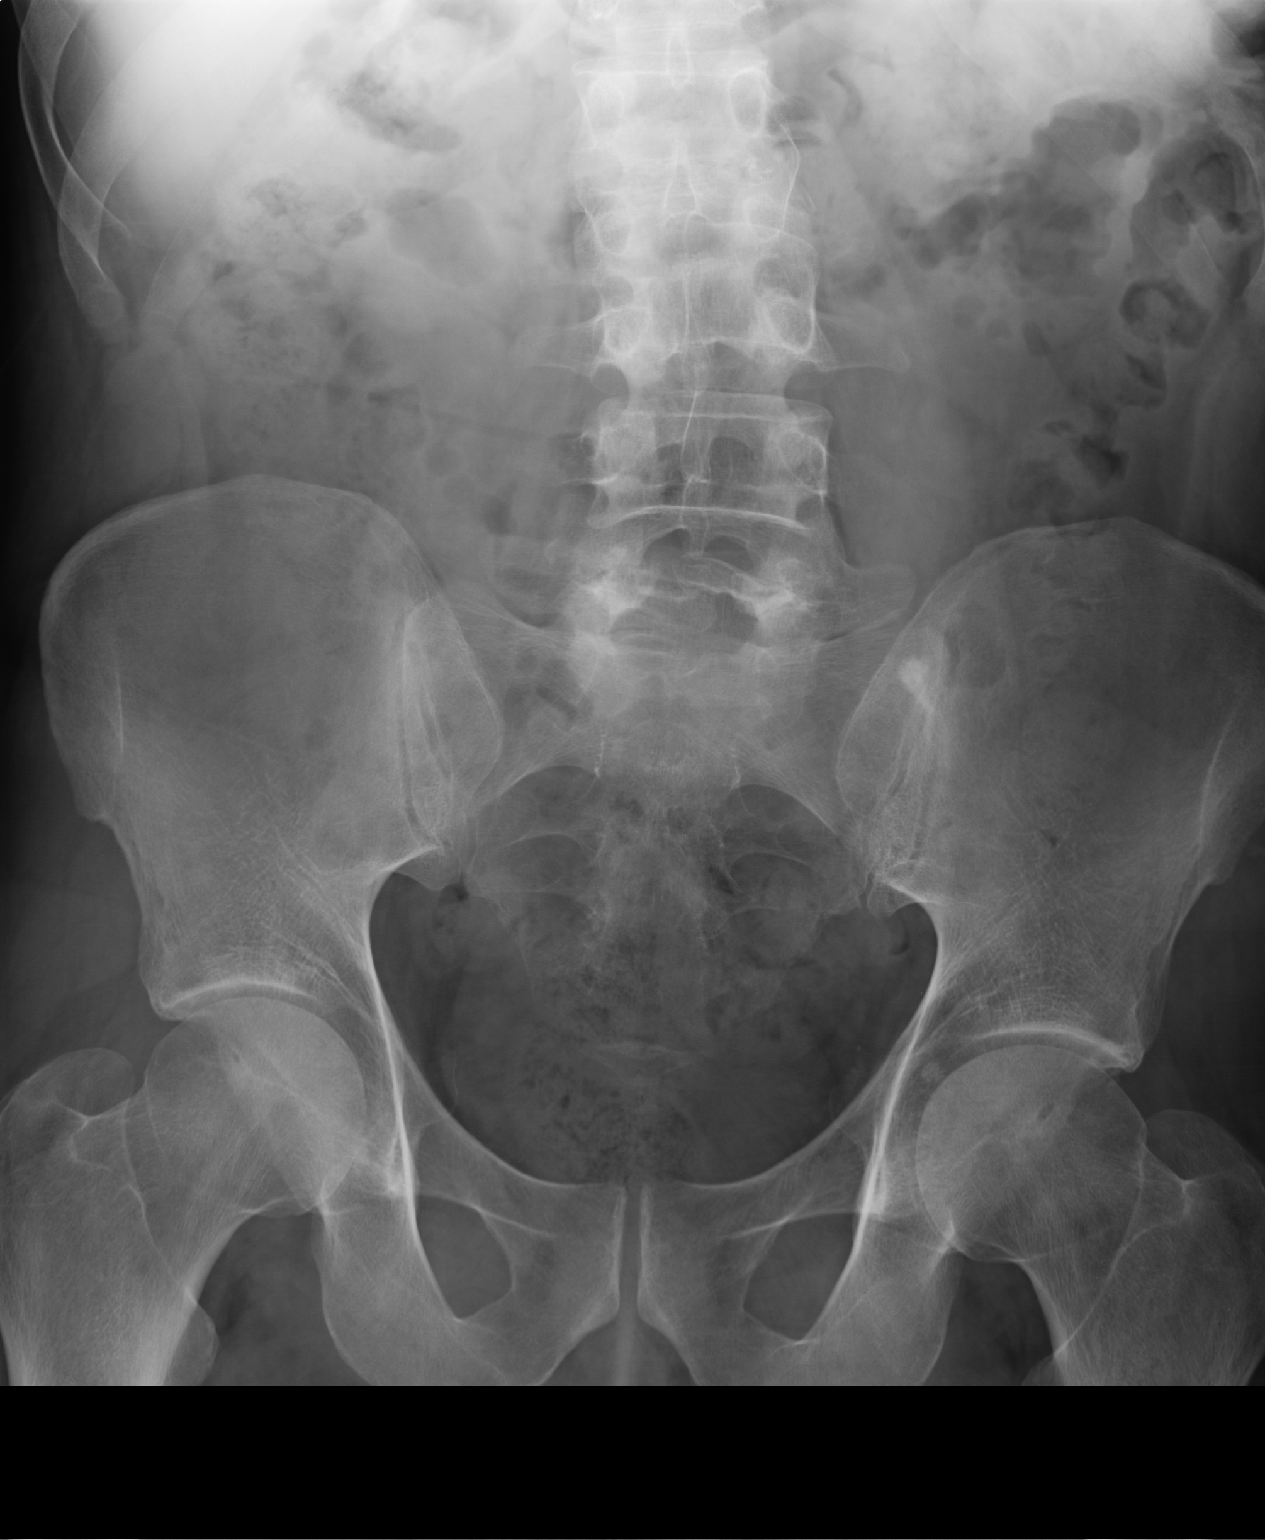
[im 2/2]
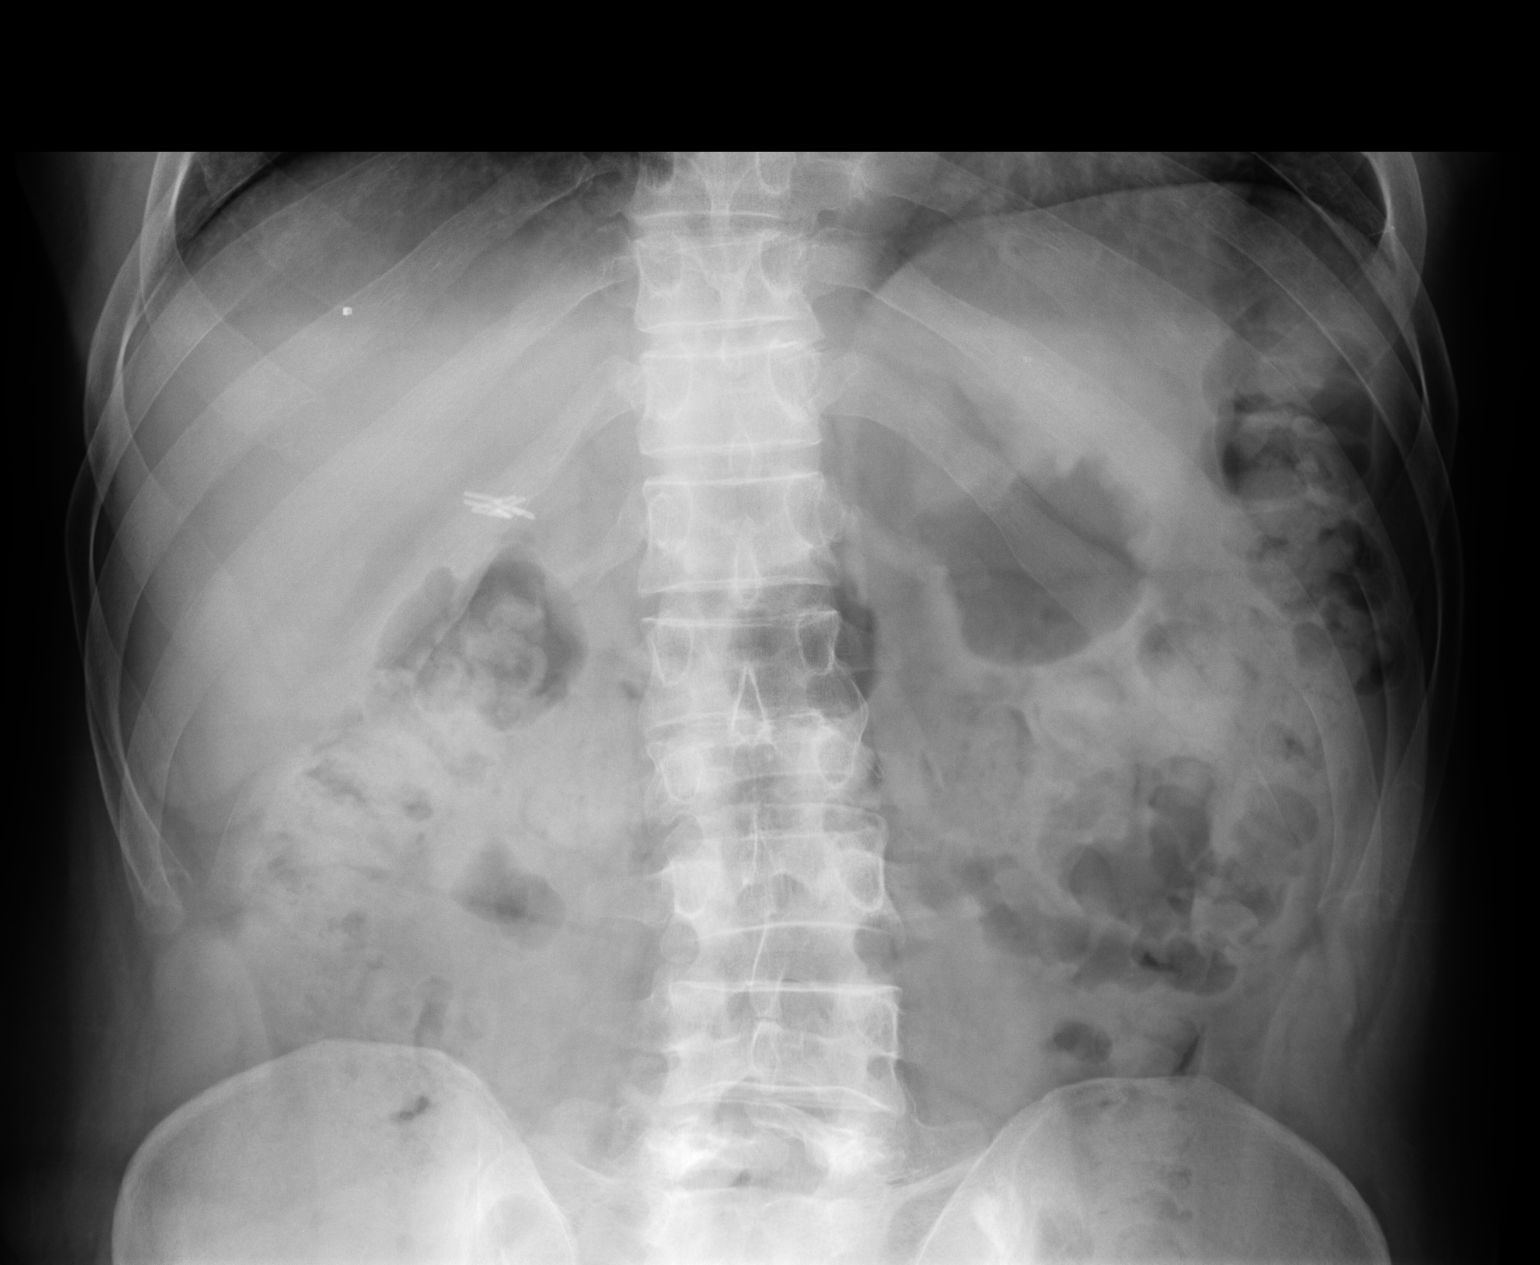

[2 of 2 positions shown; findings below may reference images not displayed]

FINDINGS: Bowel gas pattern is nonobstructive but nonspecific. Cholecystectomy clips are noted. No abnormal calcifications are seen. Moderate amount of stool is seen throughout the colon. Lung bases are clear. Moderate chronic L2 compression fracture is also identified.
IMPRESSION: Nonobstructive but nonspecific bowel gas pattern. Please consider further evaluation with CT scan for persistent or worsening symptoms.

## 2021-03-26 ENCOUNTER — Encounter (HOSPITAL_COMMUNITY): Payer: Self-pay

## 2021-03-26 ENCOUNTER — Inpatient Hospital Stay (HOSPITAL_COMMUNITY)
Admission: AD | Admit: 2021-03-26 | Payer: Self-pay | Source: Other Acute Inpatient Hospital | Admitting: Internal Medicine

## 2021-04-10 ENCOUNTER — Emergency Department
Admission: EM | Admit: 2021-04-10 | Discharge: 2021-04-10 | Disposition: A | Discharge status: Home / Self Care | Payer: MEDICAID | Attending: Family | Admitting: Family

## 2021-04-10 ENCOUNTER — Encounter (HOSPITAL_BASED_OUTPATIENT_CLINIC_OR_DEPARTMENT_OTHER): Payer: Self-pay

## 2021-04-10 ENCOUNTER — Other Ambulatory Visit: Payer: Self-pay

## 2021-04-10 DIAGNOSIS — E876 Hypokalemia: Secondary | ICD-10-CM | POA: Insufficient documentation

## 2021-04-10 DIAGNOSIS — L03114 Cellulitis of left upper limb: Secondary | ICD-10-CM

## 2021-04-10 DIAGNOSIS — R1011 Right upper quadrant pain: Secondary | ICD-10-CM | POA: Insufficient documentation

## 2021-04-10 DIAGNOSIS — Z6827 Body mass index (BMI) 27.0-27.9, adult: Secondary | ICD-10-CM

## 2021-04-10 DIAGNOSIS — K769 Liver disease, unspecified: Secondary | ICD-10-CM | POA: Insufficient documentation

## 2021-04-10 DIAGNOSIS — G8929 Other chronic pain: Secondary | ICD-10-CM | POA: Insufficient documentation

## 2021-04-10 DIAGNOSIS — L03113 Cellulitis of right upper limb: Secondary | ICD-10-CM | POA: Insufficient documentation

## 2021-04-10 HISTORY — DX: Alcoholic liver disease, unspecified (CMS HCC): K70.9

## 2021-04-10 HISTORY — DX: Essential (primary) hypertension: I10

## 2021-04-10 LAB — COMPREHENSIVE METABOLIC PANEL, NON-FASTING
ALBUMIN/GLOBULIN RATIO: 0.8 (ref 0.8–1.4)
ALBUMIN: 3.5 g/dL (ref 3.4–5.0)
ALKALINE PHOSPHATASE: 238 U/L — ABNORMAL HIGH (ref 46–116)
ALT (SGPT): 59 U/L (ref <=78)
ANION GAP: 12 mmol/L (ref 10–20)
AST (SGOT): 36 U/L (ref 15–37)
BILIRUBIN TOTAL: 0.7 mg/dL (ref 0.2–1.0)
BUN/CREA RATIO: 13
BUN: 11 mg/dL (ref 7–18)
CALCIUM, CORRECTED: 8.8 mg/dL
CALCIUM: 8.3 mg/dL — ABNORMAL LOW (ref 8.5–10.1)
CHLORIDE: 100 mmol/L (ref 98–107)
CO2 TOTAL: 25 mmol/L (ref 21–32)
CREATININE: 0.85 mg/dL (ref 0.70–1.30)
ESTIMATED GFR: 107 mL/min/{1.73_m2} (ref >59)
GLOBULIN: 4.4
GLUCOSE: 106 mg/dL (ref 74–106)
OSMOLALITY, CALCULATED: 274 mOsm/kg (ref 270–290)
POTASSIUM: 3.1 mmol/L — ABNORMAL LOW (ref 3.5–5.1)
PROTEIN TOTAL: 7.9 g/dL (ref 6.4–8.2)
SODIUM: 137 mmol/L (ref 136–145)

## 2021-04-10 LAB — CBC WITH DIFF
BASOPHIL #: 0.02 10*3/uL (ref 0.00–2.50)
BASOPHIL %: 0 % (ref 0–3)
EOSINOPHIL #: 0.23 10*3/uL (ref 0.00–2.40)
EOSINOPHIL %: 4 % (ref 0–7)
HCT: 38.1 % — ABNORMAL LOW (ref 42.0–51.0)
HGB: 13.2 g/dL — ABNORMAL LOW (ref 13.5–18.0)
LYMPHOCYTE #: 1.42 10*3/uL — ABNORMAL LOW (ref 2.10–11.00)
LYMPHOCYTE %: 22 % — ABNORMAL LOW (ref 25–45)
MCH: 31.2 pg (ref 27.0–32.0)
MCHC: 34.6 g/dL (ref 32.0–36.0)
MCV: 90.1 fL (ref 78.0–99.0)
MONOCYTE #: 0.67 10*3/uL (ref 0.00–4.10)
MONOCYTE %: 10 % (ref 0–12)
MPV: 7.6 fL (ref 7.4–10.4)
NEUTROPHIL #: 4.08 10*3/uL — ABNORMAL LOW (ref 4.10–29.00)
NEUTROPHIL %: 64 % (ref 40–76)
PLATELETS: 212 10*3/uL (ref 140–440)
RBC: 4.23 10*6/uL (ref 4.20–6.00)
RDW: 17.1 % — ABNORMAL HIGH (ref 11.6–14.8)
WBC: 6.4 10*3/uL (ref 4.0–10.5)

## 2021-04-10 LAB — LIPASE: LIPASE: 102 U/L (ref 73–393)

## 2021-04-10 LAB — GOLD TOP TUBE

## 2021-04-10 MED ORDER — CLINDAMYCIN 900 MG/50 ML IN 0.9% SODIUM CHLORIDE INTRAVENOUS PIGGYBACK
900.0000 mg | INJECTION | Freq: Once | INTRAVENOUS | Status: AC
Start: 2021-04-10 — End: 2021-04-10
  Administered 2021-04-10: 900 mg via INTRAVENOUS
  Administered 2021-04-10: 0 mg via INTRAVENOUS

## 2021-04-10 MED ORDER — CLINDAMYCIN 900 MG/50 ML IN 5 % DEXTROSE INTRAVENOUS PIGGYBACK
900.0000 mg | INJECTION | INTRAVENOUS | Status: DC
Start: 2021-04-10 — End: 2021-04-10
  Filled 2021-04-10: qty 50

## 2021-04-10 MED ORDER — PANTOPRAZOLE 40 MG INTRAVENOUS SOLUTION
40.0000 mg | INTRAVENOUS | Status: AC
Start: 2021-04-10 — End: 2021-04-10
  Administered 2021-04-10: 40 mg via INTRAVENOUS

## 2021-04-10 MED ORDER — PANTOPRAZOLE 40 MG INTRAVENOUS SOLUTION
INTRAVENOUS | Status: AC
Start: 2021-04-10 — End: 2021-04-10
  Filled 2021-04-10: qty 10

## 2021-04-10 MED ORDER — CLINDAMYCIN HCL 300 MG CAPSULE
300.0000 mg | ORAL_CAPSULE | Freq: Four times a day (QID) | ORAL | 0 refills | Status: AC
Start: 2021-04-10 — End: 2021-04-20

## 2021-04-10 MED ORDER — CLINDAMYCIN 900 MG/50 ML IN 0.9% SODIUM CHLORIDE INTRAVENOUS PIGGYBACK
INJECTION | INTRAVENOUS | Status: AC
Start: 2021-04-10 — End: 2021-04-10
  Filled 2021-04-10: qty 50

## 2021-04-10 MED ORDER — FAMOTIDINE 40 MG TABLET
40.0000 mg | ORAL_TABLET | Freq: Two times a day (BID) | ORAL | 0 refills | Status: AC
Start: 2021-04-10 — End: 2021-04-17

## 2021-04-10 MED ORDER — POTASSIUM CHLORIDE 20 MEQ/15 ML ORAL LIQUID
20.0000 meq | Freq: Every morning | ORAL | 0 refills | Status: DC
Start: 2021-04-10 — End: 2021-05-10

## 2021-04-10 NOTE — ED Nurses Note (Signed)
Patient states he shot up dilaudid bilateral hands 3 days ago and then swelling started in the left hand red swollen tender to touch and patient states he could not take the pain so he drank a fifth.  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  Patient also states he had constant vomiting last night but no vomiting today. Patient complaining of lower abdominal pain also.  Will continue to monitor.

## 2021-04-10 NOTE — ED Provider Notes (Signed)
[facility], [facility] Emergency Department  ED Primary Provider Note  History of Present Illness   Chief Complaint   Patient presents with   . Abdominal Pain     Arrival: The patient arrived by Car and is accompanied by  self  History provided by: self  History Limitations: none    Steven Parrish is a 51 y.o. male who had concerns including Abdominal Pain.  . Pt states : i know my liver is shot, i got an appt for an EGD next week. I got cellulitis in my hands and i just want some antibiotics ." hx of ivdu  Review of Systems   Constitutional: No fever, chills or weakness   Skin: No rash or diaphoresis, + swelling bil hands rednessleft hand.   HENT: No headaches, or congestion  Eyes: No vision changes or photophobia   Cardio: No chest pain, palpitations or leg swelling   Respiratory: No cough, wheezing or SOB  GI:  No nausea, vomiting or stool changes  GU:  No dysuria, hematuria, or increased frequency, + ruq pain.  MSK: No muscle aches, joint or back pain  Neuro: No seizures, LOC, numbness, tingling, or focal weakness  Psychiatric: No depression, SI or substance abuse  All other systems reviewed and are negative.  Historical Data   History Reviewed This Encounter: all reviewed    Physical Exam   ED Triage Vitals [04/10/21 1631]   BP (Non-Invasive) (!) 146/89   Heart Rate (!) 131   Respiratory Rate 16   Temperature 36.1 C (97 F)   SpO2 97 %   Weight 90.7 kg (200 lb)   Height 1.803 m (5' 11" )     Constitutional:  51 y.o. male who appears in no distress. Normal color, no cyanosis.   HENT:   Head: Normocephalic and atraumatic.   Mouth/Throat: Oropharynx is clear and moist.   Eyes: EOMI, PERRL   Neck: Trachea midline. Neck supple.  Cardiovascular: RRR, No murmurs, rubs or gallops. Intact distal pulses.  Pulmonary/Chest: BS equal bilaterally. No respiratory distress. No wheezes, rales or chest tenderness.   Abdominal: Bowel sounds present and normal. Abdomen soft,  ruq tenderness, no  rebound and no guarding.  Back: No midline spinal tenderness, no paraspinal tenderness, no CVA tenderness.           Musculoskeletal: No edema, tenderness or deformity.  Skin: warm and dry. No rash,  bil dorsal hands, tender, iv use marks. +erythema, no pallor or cyanosis  Psychiatric: normal mood and affect. Behavior is normal.   Neurological: Patient keenly alert and responsive, easily able to raise eyebrows, facial muscles/expressions symmetric, speaking in fluent sentences, moving all extremities equally and fully, normal gait  Patient Data     Labs Ordered/Reviewed   COMPREHENSIVE METABOLIC PANEL, NON-FASTING - Abnormal; Notable for the following components:       Result Value    POTASSIUM 3.1 (*)     CALCIUM 8.3 (*)     ALKALINE PHOSPHATASE 238 (*)     All other components within normal limits    Narrative:     Estimated Glomerular Filtration Rate (eGFR) is calculated using the CKD-EPI (2021) equation, intended for patients 23 years of age and older. If gender is not documented or "unknown", there will be no eGFR calculation.   CBC WITH DIFF - Abnormal; Notable for the following components:    HGB 13.2 (*)     HCT 38.1 (*)     RDW 17.1 (*)  LYMPHOCYTE % 22 (*)     NEUTROPHIL # 4.08 (*)     LYMPHOCYTE # 1.42 (*)     All other components within normal limits   LIPASE - Normal   CBC/DIFF    Narrative:     The following orders were created for panel order CBC/DIFF.  Procedure                               Abnormality         Status                     ---------                               -----------         ------                     CBC WITH DIFF[[id number]]                Abnormal            Final result                 Please view results for these tests on the individual orders.   EXTRA TUBES    Narrative:     The following orders were created for panel order EXTRA TUBES.  Procedure                               Abnormality         Status                     ---------                                -----------         ------                     GOLD TOP MWUX[[id number]]                                    In process                   Please view results for these tests on the individual orders.   GOLD TOP TUBE     No orders to display     Medical Decision Making        MDM         Medications Administered in the ED   pantoprazole (PROTONIX) injection (40 mg Intravenous Given 04/10/21 1748)   clindamycin (CLEOCIN) 900 mg in NS 50 mL premix IVPB (0 mg Intravenous Stopped 04/10/21 1842)     Following the history, physical exam, and ED workup, the patient was deemed stable and suitable for discharge. The patient/caregiver was advised to return to the ED for any new or worsening symptoms. Discharge medications, and follow-up instructions were discussed with the patient/caregiver in detail, who verbalizes understanding. The patient/caregiver is in agreement and is comfortable with the plan of care.    Disposition: Discharged         Current Discharge Medication  List      START taking these medications.      Details   clindamycin 300 mg Capsule  Commonly known as: CLEOCIN   300 mg, Oral, 4 TIMES DAILY  Qty: 40 Capsule  Refills: 0     famotidine 40 mg Tablet  Commonly known as: PEPCID   40 mg, Oral, 2 TIMES DAILY  Qty: 14 Tablet  Refills: 0     potassium chloride 20 mEq/15 mL Liquid   20 mEq, Oral, EVERY MORNING WITH BREAKFAST  Qty: 40 mL  Refills: 0        CONTINUE these medications - NO CHANGES were made during your visit.      Details   lisinopriL 20 mg Tablet  Commonly known as: PRINIVIL   20 mg, Oral, DAILY  Refills: 0     omeprazole 40 mg Capsule, Delayed Release(E.C.)  Commonly known as: PRILOSEC   40 mg, Oral, DAILY  Refills: 0          Follow up:   No follow-up provider specified.    Clinical Impression   Cellulitis of right hand (Primary)   Cellulitis of hand, left   Chronic RUQ pain   Chronic liver disease   Hypokalemia

## 2021-04-10 NOTE — ED Triage Notes (Signed)
Lower abdominal  Cramping pain, has alcoholic liver disease,  Shot up in bilat arm and has pain in them now

## 2021-04-10 NOTE — ED Nurses Note (Signed)
Patient lying in bed snoring eyes closed.  No complaints.  Will continue to monitor.

## 2021-04-12 ENCOUNTER — Other Ambulatory Visit: Payer: Self-pay

## 2021-04-12 ENCOUNTER — Emergency Department
Admission: EM | Admit: 2021-04-12 | Discharge: 2021-04-12 | Disposition: A | Discharge status: Home / Self Care | Payer: MEDICAID

## 2021-04-12 ENCOUNTER — Encounter (HOSPITAL_BASED_OUTPATIENT_CLINIC_OR_DEPARTMENT_OTHER): Payer: Self-pay

## 2021-04-12 DIAGNOSIS — R519 Headache, unspecified: Secondary | ICD-10-CM

## 2021-04-12 DIAGNOSIS — Z79899 Other long term (current) drug therapy: Secondary | ICD-10-CM

## 2021-04-12 DIAGNOSIS — Z760 Encounter for issue of repeat prescription: Secondary | ICD-10-CM

## 2021-04-12 DIAGNOSIS — Z6827 Body mass index (BMI) 27.0-27.9, adult: Secondary | ICD-10-CM

## 2021-04-12 MED ORDER — LISINOPRIL 20 MG TABLET
20.0000 mg | ORAL_TABLET | Freq: Every day | ORAL | 0 refills | Status: DC
Start: 2021-04-12 — End: 2021-06-27

## 2021-04-12 NOTE — ED Triage Notes (Signed)
Patient needs refill on  Lisinipril 20 mg daily until he can get into see PCP

## 2021-04-12 NOTE — ED Provider Notes (Signed)
[name], [facility] Emergency Department  ED Primary Provider Note  History of Present Illness   Chief Complaint   Patient presents with   . Medication Refill     Patient needs refill on  Lisinipril 20 mg daily until he can get into see PCP     Steven Parrish is a 51 y.o. male who had concerns including Medication Refill.  Arrival: The patient arrived by Car    51 year old male patient presents to the ED. patient states he is out of his blood pressure medication for last 2 days.  Patient states he was here for abdominal pain yesterday.  Patient states he has been taking lisinopril 20 mg once a day.  Patient denies chest pain, shortness of breath, or dizziness.  Patient states he will see his PCP in 2-4 weeks.  No other complaints at this time.  No acute distress.      History provided by:  Patient    Review of Systems   Pertinent positive and negative ROS as per HPI.  Review of Systems   All other systems reviewed and are negative.    Historical Data   History Reviewed This Encounter:     Physical Exam   ED Triage Vitals [04/12/21 1411]   BP (Non-Invasive) 120/77   Heart Rate (!) 110   Respiratory Rate 16   Temperature 36.3 C (97.4 F)   SpO2 93 %   Weight 90.7 kg (200 lb)   Height 1.803 m (5\' 11" )     Physical Exam  Vitals and nursing note reviewed.   Constitutional:       Appearance: Normal appearance.   HENT:      Head: Normocephalic and atraumatic.   Eyes:      Extraocular Movements: Extraocular movements intact.      Pupils: Pupils are equal, round, and reactive to light.   Cardiovascular:      Rate and Rhythm: Normal rate.      Pulses: Normal pulses.      Heart sounds: Normal heart sounds.   Pulmonary:      Effort: Pulmonary effort is normal.      Breath sounds: Normal breath sounds.   Abdominal:      General: Bowel sounds are normal.      Palpations: Abdomen is soft.   Musculoskeletal:         General: Normal range of motion.      Cervical back: Normal range of motion  and neck supple.   Skin:     General: Skin is warm and dry.   Neurological:      General: No focal deficit present.      Mental Status: He is alert and oriented to person, place, and time.   Psychiatric:         Mood and Affect: Mood normal.         Behavior: Behavior normal.       Patient Data   Labs Ordered/Reviewed - No data to display  No orders to display     Medical Decision Making        Medical Decision Making  Patient presents to the ED today and he is out of his blood pressure medication for last 2 days.  Patient states he has been taking lisinopril 20 mg once daily.  Patient states he will see his PCP in 2-4 weeks.  Patient denies any chest pain or shortness of breath.  Patient left before we repeat his  vital signs.     Risk  Prescription drug management.                  Clinical Impression   Headache (Primary)   Medication refill       Disposition: Discharged

## 2021-04-12 NOTE — Discharge Instructions (Signed)
As we have discussed, follow-up with your PCP as needed.  Take Tylenol or ibuprofen every 4-6 hours as needed for pain. If your symptoms get worse or no improvement, visit nearest ED.

## 2021-05-10 ENCOUNTER — Other Ambulatory Visit: Payer: Self-pay

## 2021-05-10 ENCOUNTER — Emergency Department
Admission: EM | Admit: 2021-05-10 | Discharge: 2021-05-10 | Disposition: A | Discharge status: Home / Self Care | Payer: MEDICAID

## 2021-05-10 ENCOUNTER — Emergency Department (HOSPITAL_BASED_OUTPATIENT_CLINIC_OR_DEPARTMENT_OTHER): Payer: MEDICAID

## 2021-05-10 ENCOUNTER — Encounter (HOSPITAL_BASED_OUTPATIENT_CLINIC_OR_DEPARTMENT_OTHER): Payer: Self-pay

## 2021-05-10 DIAGNOSIS — F1110 Opioid abuse, uncomplicated: Secondary | ICD-10-CM | POA: Insufficient documentation

## 2021-05-10 DIAGNOSIS — L03113 Cellulitis of right upper limb: Secondary | ICD-10-CM | POA: Insufficient documentation

## 2021-05-10 DIAGNOSIS — Z6828 Body mass index (BMI) 28.0-28.9, adult: Secondary | ICD-10-CM

## 2021-05-10 DIAGNOSIS — J329 Chronic sinusitis, unspecified: Secondary | ICD-10-CM | POA: Insufficient documentation

## 2021-05-10 DIAGNOSIS — L03119 Cellulitis of unspecified part of limb: Secondary | ICD-10-CM

## 2021-05-10 HISTORY — DX: Other psychoactive substance abuse, uncomplicated (CMS HCC): F19.10

## 2021-05-10 HISTORY — DX: Unspecified cirrhosis of liver (CMS HCC): K74.60

## 2021-05-10 LAB — COMPREHENSIVE METABOLIC PANEL, NON-FASTING
ALBUMIN/GLOBULIN RATIO: 1 (ref 0.8–1.4)
ALBUMIN: 4 g/dL (ref 3.4–5.0)
ALKALINE PHOSPHATASE: 171 U/L — ABNORMAL HIGH (ref 46–116)
ALT (SGPT): 37 U/L (ref <=78)
ANION GAP: 9 mmol/L — ABNORMAL LOW (ref 10–20)
AST (SGOT): 27 U/L (ref 15–37)
BILIRUBIN TOTAL: 0.5 mg/dL (ref 0.2–1.0)
BUN/CREA RATIO: 15
BUN: 15 mg/dL (ref 7–18)
CALCIUM, CORRECTED: 9 mg/dL
CALCIUM: 9 mg/dL (ref 8.5–10.1)
CHLORIDE: 102 mmol/L (ref 98–107)
CO2 TOTAL: 27 mmol/L (ref 21–32)
CREATININE: 0.99 mg/dL (ref 0.70–1.30)
ESTIMATED GFR: 93 mL/min/{1.73_m2} (ref >59)
GLOBULIN: 4.2
GLUCOSE: 91 mg/dL (ref 74–106)
OSMOLALITY, CALCULATED: 276 mOsm/kg (ref 270–290)
POTASSIUM: 3.7 mmol/L (ref 3.5–5.1)
PROTEIN TOTAL: 8.2 g/dL (ref 6.4–8.2)
SODIUM: 138 mmol/L (ref 136–145)

## 2021-05-10 LAB — CBC WITH DIFF
BASOPHIL #: 0.03 10*3/uL (ref 0.00–2.50)
BASOPHIL %: 0 % (ref 0–3)
EOSINOPHIL #: 0.3 10*3/uL (ref 0.00–2.40)
EOSINOPHIL %: 4 % (ref 0–7)
HCT: 37.2 % — ABNORMAL LOW (ref 42.0–51.0)
HGB: 13 g/dL — ABNORMAL LOW (ref 13.5–18.0)
LYMPHOCYTE #: 2.35 10*3/uL (ref 2.10–11.00)
LYMPHOCYTE %: 30 % (ref 25–45)
MCH: 32.4 pg — ABNORMAL HIGH (ref 27.0–32.0)
MCHC: 35.1 g/dL (ref 32.0–36.0)
MCV: 92.6 fL (ref 78.0–99.0)
MONOCYTE #: 0.74 10*3/uL (ref 0.00–4.10)
MONOCYTE %: 9 % (ref 0–12)
MPV: 7.3 fL — ABNORMAL LOW (ref 7.4–10.4)
NEUTROPHIL #: 4.54 10*3/uL (ref 4.10–29.00)
NEUTROPHIL %: 57 % (ref 40–76)
PLATELETS: 211 10*3/uL (ref 140–440)
RBC: 4.02 10*6/uL — ABNORMAL LOW (ref 4.20–6.00)
RDW: 17.4 % — ABNORMAL HIGH (ref 11.6–14.8)
WBC: 8 10*3/uL (ref 4.0–10.5)

## 2021-05-10 MED ORDER — DOXYCYCLINE HYCLATE 100 MG CAPSULE
100.0000 mg | ORAL_CAPSULE | Freq: Two times a day (BID) | ORAL | 0 refills | Status: AC
Start: 2021-05-10 — End: 2021-05-20

## 2021-05-10 NOTE — ED Triage Notes (Signed)
2.5 to 3 weeks sinus pressure and discomfort. Not seen for the sinus to date. Abscess right and left forearm. Believes related to iv drug usage.

## 2021-05-10 NOTE — ED Nurses Note (Signed)
Area of redness around right forearm marked with skin marker. Patient discharged to home. Discharge instructions reviewed with patient. Verbalized understanding. Left ED via ambulatory.

## 2021-05-10 NOTE — Discharge Instructions (Signed)
As we have discussed, follow-up with your PCP as needed.  Take prescription medication as directed.  If your symptoms get worse or no improvement, visit nearest ED.

## 2021-05-10 NOTE — ED Provider Notes (Signed)
[facility], [facility] Emergency Department  ED Primary Provider Note  History of Present Illness   Chief Complaint   Patient presents with   . Sinus Problem   . Abscess     Steven Parrish is a 51 y.o. male who had concerns including Sinus Problem and Abscess.  Arrival: The patient arrived by Car    51 yrs old male pt presents to ED with complaints of sinus pressure and pain for the last 2.5 wks and the right proximal wrist swelling and redness since today. Pt states that he is IVDA and used dilaudid today.  Pt denies chest pain or sob. Denies fever. No other complaints. No acute distress.         Review of Systems   Pertinent positive and negative ROS as per HPI.  Review of Systems   Constitutional: Negative.    HENT: Positive for sinus pressure and sinus pain.    Eyes: Negative.    Respiratory: Negative.    Cardiovascular: Negative.    Gastrointestinal: Negative.    Genitourinary: Negative.    Musculoskeletal:        Right forearm swelling and redness   Skin:        Swelling and redness on the right forearm   Neurological: Negative.    Psychiatric/Behavioral: Negative.      Historical Data   History Reviewed This Encounter: Medical History  Surgical History  Family History  Social History      Physical Exam   ED Triage Vitals [05/10/21 1703]   BP (Non-Invasive) (!) 142/98   Heart Rate (!) 101   Respiratory Rate 20   Temperature 37.1 C (98.7 F)   SpO2 95 %   Weight 93 kg (205 lb)   Height 1.803 m (5' 11" )     Physical Exam  Vitals and nursing note reviewed.   Constitutional:       Appearance: Normal appearance.   HENT:      Head: Normocephalic and atraumatic.        Comments: Tenderness with palpation  Cardiovascular:      Rate and Rhythm: Normal rate and regular rhythm.      Pulses: Normal pulses.      Heart sounds: Normal heart sounds.   Pulmonary:      Effort: Pulmonary effort is normal.      Breath sounds: Normal breath sounds.   Abdominal:      General: Bowel sounds  are normal.      Palpations: Abdomen is soft.   Musculoskeletal:      Right forearm: Swelling and tenderness present.      Left forearm: Swelling present.      Cervical back: Normal range of motion and neck supple.   Skin:     Findings: Erythema present.      Comments: Right forearm swelling and redness due to IVDA; Pt states that he used dilaudid iv today; swelling and redness on the IV site; posterior proximal wrist   Neurological:      General: No focal deficit present.      Mental Status: He is alert and oriented to person, place, and time.   Psychiatric:         Mood and Affect: Mood normal.         Behavior: Behavior normal.       Patient Data     Labs Ordered/Reviewed   COMPREHENSIVE METABOLIC PANEL, NON-FASTING - Abnormal; Notable for the following components:  Result Value    ANION GAP 9 (*)     ALKALINE PHOSPHATASE 171 (*)     All other components within normal limits    Narrative:     Estimated Glomerular Filtration Rate (eGFR) is calculated using the CKD-EPI (2021) equation, intended for patients 102 years of age and older. If gender is not documented or "unknown", there will be no eGFR calculation.   CBC WITH DIFF - Abnormal; Notable for the following components:    RBC 4.02 (*)     HGB 13.0 (*)     HCT 37.2 (*)     MCH 32.4 (*)     RDW 17.4 (*)     MPV 7.3 (*)     All other components within normal limits   CBC/DIFF    Narrative:     The following orders were created for panel order CBC/DIFF.  Procedure                               Abnormality         Status                     ---------                               -----------         ------                     CBC WITH XYIA[[id number]]                Abnormal            Final result                 Please view results for these tests on the individual orders.     XR FOREARM RIGHT   Final Result by Edi, Rad[facility] In (04/01 1818)   Negative for osteomyelitis. No radiopaque foreign body identified             Radiologist location ID: [id number]        Medical Decision Making  Pt complaints of the right proximal wrist pain, swelling and redness due to IVDA. Unremarkable lab results and no acute abnormality on xray. Pt has cellulitis. Pt will have doxycycline. Pt is stable and no acute distress.     Amount and/or Complexity of Data Reviewed  Labs: ordered.  Radiology: ordered.      Risk  Prescription drug management.                  Clinical Impression   Sinusitis, unspecified chronicity, unspecified location (Primary)   Cellulitis of arm       Disposition: Discharged

## 2021-05-19 ENCOUNTER — Emergency Department
Admission: EM | Admit: 2021-05-19 | Discharge: 2021-05-19 | Disposition: A | Discharge status: Home / Self Care | Payer: MEDICAID | Attending: Family | Admitting: Family

## 2021-05-19 ENCOUNTER — Other Ambulatory Visit: Payer: Self-pay

## 2021-05-19 DIAGNOSIS — L02414 Cutaneous abscess of left upper limb: Secondary | ICD-10-CM | POA: Insufficient documentation

## 2021-05-19 DIAGNOSIS — L02413 Cutaneous abscess of right upper limb: Secondary | ICD-10-CM | POA: Insufficient documentation

## 2021-05-19 DIAGNOSIS — Z6827 Body mass index (BMI) 27.0-27.9, adult: Secondary | ICD-10-CM

## 2021-05-19 DIAGNOSIS — L02419 Cutaneous abscess of limb, unspecified: Secondary | ICD-10-CM

## 2021-05-19 LAB — CBC WITH DIFF
BASOPHIL #: 0.04 10*3/uL (ref 0.00–2.50)
BASOPHIL %: 1 % (ref 0–3)
EOSINOPHIL #: 0.23 10*3/uL (ref 0.00–2.40)
EOSINOPHIL %: 4 % (ref 0–7)
HCT: 37.9 % — ABNORMAL LOW (ref 42.0–51.0)
HGB: 13.3 g/dL — ABNORMAL LOW (ref 13.5–18.0)
LYMPHOCYTE #: 2.21 10*3/uL (ref 2.10–11.00)
LYMPHOCYTE %: 34 % (ref 25–45)
MCH: 32 pg (ref 27.0–32.0)
MCHC: 35.2 g/dL (ref 32.0–36.0)
MCV: 91 fL (ref 78.0–99.0)
MONOCYTE #: 0.63 10*3/uL (ref 0.00–4.10)
MONOCYTE %: 10 % (ref 0–12)
MPV: 7.7 fL (ref 7.4–10.4)
NEUTROPHIL #: 3.35 10*3/uL — ABNORMAL LOW (ref 4.10–29.00)
NEUTROPHIL %: 52 % (ref 40–76)
PLATELETS: 228 10*3/uL (ref 140–440)
RBC: 4.16 10*6/uL — ABNORMAL LOW (ref 4.20–6.00)
RDW: 16.7 % — ABNORMAL HIGH (ref 11.6–14.8)
WBC: 6.5 10*3/uL (ref 4.0–10.5)

## 2021-05-19 LAB — COMPREHENSIVE METABOLIC PANEL, NON-FASTING
ALBUMIN/GLOBULIN RATIO: 0.9 (ref 0.8–1.4)
ALBUMIN: 3.8 g/dL (ref 3.4–5.0)
ALKALINE PHOSPHATASE: 120 U/L — ABNORMAL HIGH (ref 46–116)
ALT (SGPT): 26 U/L (ref <=78)
ANION GAP: 9 mmol/L — ABNORMAL LOW (ref 10–20)
AST (SGOT): 21 U/L (ref 15–37)
BILIRUBIN TOTAL: 0.5 mg/dL (ref 0.2–1.0)
BUN/CREA RATIO: 13
BUN: 11 mg/dL (ref 7–18)
CALCIUM, CORRECTED: 9.2 mg/dL
CALCIUM: 9 mg/dL (ref 8.5–10.1)
CHLORIDE: 103 mmol/L (ref 98–107)
CO2 TOTAL: 25 mmol/L (ref 21–32)
CREATININE: 0.85 mg/dL (ref 0.70–1.30)
ESTIMATED GFR: 107 mL/min/{1.73_m2} (ref >59)
GLOBULIN: 4.1
GLUCOSE: 121 mg/dL — ABNORMAL HIGH (ref 74–106)
OSMOLALITY, CALCULATED: 275 mOsm/kg (ref 270–290)
POTASSIUM: 3.6 mmol/L (ref 3.5–5.1)
PROTEIN TOTAL: 7.9 g/dL (ref 6.4–8.2)
SODIUM: 137 mmol/L (ref 136–145)

## 2021-05-19 LAB — LACTIC ACID LEVEL W/ REFLEX FOR LEVEL >2.0: LACTIC ACID: 0.4 mmol/L (ref 0.4–2.0)

## 2021-05-19 MED ORDER — SODIUM CHLORIDE 0.9 % (FLUSH) INJECTION SYRINGE
3.0000 mL | INJECTION | INTRAMUSCULAR | Status: DC | PRN
Start: 2021-05-19 — End: 2021-05-19

## 2021-05-19 MED ORDER — SODIUM CHLORIDE 0.9 % (FLUSH) INJECTION SYRINGE
3.0000 mL | INJECTION | Freq: Three times a day (TID) | INTRAMUSCULAR | Status: DC
Start: 2021-05-19 — End: 2021-05-19

## 2021-05-19 MED ORDER — CLINDAMYCIN HCL 300 MG CAPSULE
300.0000 mg | ORAL_CAPSULE | Freq: Four times a day (QID) | ORAL | 0 refills | Status: DC
Start: 2021-05-19 — End: 2021-06-27

## 2021-05-19 MED ORDER — CLINDAMYCIN 1 % TOPICAL GEL, ONCE DAILY
1.0000 g | Freq: Every day | CUTANEOUS | 0 refills | Status: DC
Start: 2021-05-19 — End: 2021-06-27

## 2021-05-19 MED ORDER — CLINDAMYCIN 900 MG/50 ML IN 0.9% SODIUM CHLORIDE INTRAVENOUS PIGGYBACK
INJECTION | INTRAVENOUS | Status: AC
Start: 2021-05-19 — End: 2021-05-19
  Filled 2021-05-19: qty 50

## 2021-05-19 MED ORDER — CLINDAMYCIN 900 MG/50 ML IN 0.9% SODIUM CHLORIDE INTRAVENOUS PIGGYBACK
900.0000 mg | INJECTION | INTRAVENOUS | Status: AC
Start: 2021-05-19 — End: 2021-05-19
  Administered 2021-05-19: 0 mg via INTRAVENOUS
  Administered 2021-05-19: 900 mg via INTRAVENOUS

## 2021-05-19 NOTE — Discharge Instructions (Signed)
Moist head to area. Return in 2 days for another recheck. Sooner if worse.
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
## 2021-05-19 NOTE — ED Provider Notes (Signed)
[facility], [facility] Emergency Department  ED Primary Provider Note  History of Present Illness   Chief Complaint   Patient presents with   . Abscess     Arrival: The patient arrived by Car    Steven Parrish is a 51 y.o. male who had concerns including Abscess. pt states finishing doxy for abscess bil forearm. Denies fever. Told her may need to return for iv antibiotics if not better. Less red but still swollen.     Review of Systems   Constitutional: No fever, chills or weakness + fatigue.   Skin: No rash or diaphoresis, +  swelling.   HENT: No headaches, or congestion  Eyes: No vision changes or photophobia   Cardio: No chest pain, palpitations or leg swelling   Respiratory: No cough, wheezing or SOB  GI:  No nausea, vomiting or stool changes  GU:  No dysuria, hematuria, or increased frequency  MSK: No muscle aches, joint or back pain  Neuro: No seizures, LOC, numbness, tingling, or focal weakness  Psychiatric: No depression, SI or substance abuse  All other systems reviewed and are negative.    Historical Data    all noted and reviewed.     Physical Exam   ED Triage Vitals [05/19/21 1457]   BP (Non-Invasive) 125/83   Heart Rate 85   Respiratory Rate 16   Temperature 36.6 C (97.8 F)   SpO2 96 %   Weight 88.5 kg (195 lb)   Height 1.803 m (5' 11")       Constitutional:  51 y.o. male who appears in no distress. Normal color, no cyanosis.   HENT:   Head: Normocephalic and atraumatic.   Mouth/Throat: Oropharynx is clear and moist.   Eyes: EOMI, PERRL   Neck: Trachea midline. Neck supple.  Cardiovascular: RRR, No murmurs, rubs or gallops. Intact distal pulses.  Pulmonary/Chest: BS equal bilaterally. No respiratory distress. No wheezes, rales or chest tenderness.   Abdominal: Bowel sounds present and normal. Abdomen soft, no tenderness, no rebound and no guarding.  Back: No midline spinal tenderness, no paraspinal tenderness, no CVA tenderness.           Musculoskeletal: No edema,  tenderness or deformity.  Skin: warm and dry. No rash, mild erythema rt forearm, indurated area bil non fluctuant quarter sized no steaking no crepitus , no  pallor or cyanosis  Psychiatric: normal mood and affect. Behavior is normal.   Neurological: Patient keenly alert and responsive, easily able to raise eyebrows, facial muscles/expressions symmetric, speaking in fluent sentences, moving all extremities equally and fully, normal gait  Patient Data     Labs Ordered/Reviewed   COMPREHENSIVE METABOLIC PANEL, NON-FASTING - Abnormal; Notable for the following components:       Result Value    ANION GAP 9 (*)     GLUCOSE 121 (*)     ALKALINE PHOSPHATASE 120 (*)     All other components within normal limits    Narrative:     Estimated Glomerular Filtration Rate (eGFR) is calculated using the CKD-EPI (2021) equation, intended for patients 54 years of age and older. If gender is not documented or "unknown", there will be no eGFR calculation.   LACTIC ACID LEVEL W/ REFLEX FOR LEVEL >2.0 - Normal   CBC/DIFF    Narrative:     The following orders were created for panel order CBC/DIFF.  Procedure  Abnormality         Status                     ---------                               -----------         ------                     CBC WITH BOFB[[id number]]                                    In process                   Please view results for these tests on the individual orders.   CBC WITH DIFF     No orders to display     Medical Decision Making  diff dx of mrsa, staph strep , abscess, cellulitis.          Medications Administered in the ED   NS flush syringe (has no administration in time range)   NS flush syringe (has no administration in time range)   clindamycin (CLEOCIN) 900 mg in NS 50 mL premix IVPB (900 mg Intravenous New Bag/New Syringe 05/19/21 1519)     Clinical Impression   Abscess of multiple sites of upper arm (Primary)       Disposition: Discharged

## 2021-05-19 NOTE — ED Nurses Note (Signed)
Patient discharged to home with two prescriptions. Discharge instructions reviewed with patient. Verbalized understanding. IV removed with cath intact. Pressure dressing applied. No bleeding noted at site. Patient left ED via ambulatory.

## 2021-05-21 ENCOUNTER — Emergency Department
Admission: EM | Admit: 2021-05-21 | Discharge: 2021-05-21 | Disposition: A | Discharge status: Home / Self Care | Payer: MEDICAID | Attending: Family Medicine | Admitting: Family Medicine

## 2021-05-21 ENCOUNTER — Encounter (HOSPITAL_BASED_OUTPATIENT_CLINIC_OR_DEPARTMENT_OTHER): Payer: Self-pay | Admitting: Family Medicine

## 2021-05-21 ENCOUNTER — Other Ambulatory Visit: Payer: Self-pay

## 2021-05-21 DIAGNOSIS — L03113 Cellulitis of right upper limb: Secondary | ICD-10-CM | POA: Insufficient documentation

## 2021-05-21 DIAGNOSIS — Z6827 Body mass index (BMI) 27.0-27.9, adult: Secondary | ICD-10-CM
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
## 2021-05-21 NOTE — ED Triage Notes (Addendum)
Here two days ago for cellulitis right bilateral wrists.   Was advised to return for recheck.  Swelling increased on right forearm injection sites.   Reports self injecting Dilaudid right wrist 40 mins ago. Had IV Clindamycin two days.  Just filled the oral antibiotics and ointment today.   Needs refill for Lisinopril.

## 2021-06-24 ENCOUNTER — Encounter (HOSPITAL_COMMUNITY): Payer: Self-pay

## 2021-06-24 NOTE — Care Management Notes (Signed)
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 States drinks 3 pints of vodka daily and injects dilaudid IV when he can afford to buy it. Several ED visits over last 2 months for cellulitis of wrists at injection sites.  Has HTN but has ran out of meds recently due to poor follow up.

## 2021-06-25 ENCOUNTER — Inpatient Hospital Stay (HOSPITAL_COMMUNITY): Payer: MEDICAID | Admitting: Student in an Organized Health Care Education/Training Program

## 2021-06-25 ENCOUNTER — Inpatient Hospital Stay
Admission: RE | Admit: 2021-06-25 | Discharge: 2021-06-27 | DRG: 897 | Disposition: A | Discharge status: Home / Self Care | Payer: MEDICAID | Source: Other Acute Inpatient Hospital | Attending: Student in an Organized Health Care Education/Training Program | Admitting: Student in an Organized Health Care Education/Training Program

## 2021-06-25 DIAGNOSIS — K700 Alcoholic fatty liver: Secondary | ICD-10-CM | POA: Diagnosis present

## 2021-06-25 DIAGNOSIS — F1123 Opioid dependence with withdrawal: Secondary | ICD-10-CM | POA: Diagnosis present

## 2021-06-25 DIAGNOSIS — M542 Cervicalgia: Secondary | ICD-10-CM | POA: Diagnosis present

## 2021-06-25 DIAGNOSIS — F4310 Post-traumatic stress disorder, unspecified: Secondary | ICD-10-CM | POA: Diagnosis present

## 2021-06-25 DIAGNOSIS — Z818 Family history of other mental and behavioral disorders: Secondary | ICD-10-CM

## 2021-06-25 DIAGNOSIS — R9431 Abnormal electrocardiogram [ECG] [EKG]: Secondary | ICD-10-CM

## 2021-06-25 DIAGNOSIS — F17210 Nicotine dependence, cigarettes, uncomplicated: Secondary | ICD-10-CM | POA: Diagnosis present

## 2021-06-25 DIAGNOSIS — F10239 Alcohol dependence with withdrawal, unspecified: Principal | ICD-10-CM | POA: Diagnosis present

## 2021-06-25 DIAGNOSIS — F411 Generalized anxiety disorder: Secondary | ICD-10-CM | POA: Diagnosis present

## 2021-06-25 DIAGNOSIS — Z91410 Personal history of adult physical and sexual abuse: Secondary | ICD-10-CM

## 2021-06-25 DIAGNOSIS — M549 Dorsalgia, unspecified: Secondary | ICD-10-CM | POA: Diagnosis present

## 2021-06-25 DIAGNOSIS — F419 Anxiety disorder, unspecified: Secondary | ICD-10-CM

## 2021-06-25 DIAGNOSIS — F32A Depression, unspecified: Secondary | ICD-10-CM | POA: Diagnosis present

## 2021-06-25 DIAGNOSIS — Z87820 Personal history of traumatic brain injury: Secondary | ICD-10-CM

## 2021-06-25 DIAGNOSIS — Z79899 Other long term (current) drug therapy: Secondary | ICD-10-CM

## 2021-06-25 DIAGNOSIS — I8510 Secondary esophageal varices without bleeding: Secondary | ICD-10-CM | POA: Diagnosis present

## 2021-06-25 DIAGNOSIS — J309 Allergic rhinitis, unspecified: Secondary | ICD-10-CM | POA: Diagnosis present

## 2021-06-25 DIAGNOSIS — I10 Essential (primary) hypertension: Secondary | ICD-10-CM | POA: Diagnosis present

## 2021-06-25 DIAGNOSIS — F10939 Alcohol use, unspecified with withdrawal, unspecified (CMS HCC): Secondary | ICD-10-CM

## 2021-06-25 DIAGNOSIS — G8929 Other chronic pain: Secondary | ICD-10-CM | POA: Diagnosis present

## 2021-06-25 DIAGNOSIS — B182 Chronic viral hepatitis C: Secondary | ICD-10-CM | POA: Diagnosis present

## 2021-06-25 DIAGNOSIS — Z6829 Body mass index (BMI) 29.0-29.9, adult: Secondary | ICD-10-CM

## 2021-06-25 DIAGNOSIS — Z62810 Personal history of physical and sexual abuse in childhood: Secondary | ICD-10-CM | POA: Diagnosis present

## 2021-06-25 LAB — BASIC METABOLIC PANEL
ANION GAP: 8 mmol/L (ref 4–13)
BUN/CREA RATIO: 17 (ref 6–22)
BUN: 12 mg/dL (ref 8–25)
CALCIUM: 9 mg/dL (ref 8.5–10.0)
CHLORIDE: 109 mmol/L (ref 96–111)
CO2 TOTAL: 21 mmol/L — ABNORMAL LOW (ref 22–30)
CREATININE: 0.72 mg/dL — ABNORMAL LOW (ref 0.75–1.35)
ESTIMATED GFR: >90 mL/min/BSA (ref >=60)
GLUCOSE: 85 mg/dL (ref 65–125)
POTASSIUM: 4.3 mmol/L (ref 3.5–5.1)
SODIUM: 138 mmol/L (ref 136–145)

## 2021-06-25 LAB — ECG 12-LEAD
Atrial Rate: 84 {beats}/min
Calculated P Axis: 32 degrees
Calculated R Axis: -14 degrees
Calculated T Axis: 1 degrees
PR Interval: 152 ms
QRS Duration: 86 ms
QT Interval: 374 ms
QTC Calculation: 441 ms
Ventricular rate: 84 {beats}/min

## 2021-06-25 LAB — HEPATIC FUNCTION PANEL
ALBUMIN: 3.7 g/dL (ref 3.5–5.0)
ALKALINE PHOSPHATASE: 176 U/L — ABNORMAL HIGH (ref 45–115)
ALT (SGPT): 32 U/L (ref 10–55)
AST (SGOT): 39 U/L (ref 8–45)
BILIRUBIN DIRECT: 0.3 mg/dL (ref 0.1–0.4)
BILIRUBIN TOTAL: 1.2 mg/dL (ref 0.3–1.3)
PROTEIN TOTAL: 7.6 g/dL (ref 6.4–8.3)

## 2021-06-25 LAB — HIV1/HIV2 SCREEN, COMBINED ANTIGEN AND ANTIBODY: HIV SCREEN, COMBINED ANTIGEN & ANTIBODY: NEGATIVE

## 2021-06-25 LAB — HEPATITIS B SURFACE ANTIGEN: HBV SURFACE ANTIGEN QUALITATIVE: NEGATIVE

## 2021-06-25 LAB — HEPATITIS C ANTIBODY SCREEN WITH REFLEX TO HCV PCR: HCV ANTIBODY QUALITATIVE: REACTIVE — AB

## 2021-06-25 LAB — MAGNESIUM: MAGNESIUM: 1.7 mg/dL — ABNORMAL LOW (ref 1.8–2.6)

## 2021-06-25 LAB — PHOSPHORUS: PHOSPHORUS: 2.6 mg/dL (ref 2.4–4.7)

## 2021-06-25 LAB — HEPATITIS A (HAV) IGG ANTIBODY: HAV IGG ANTIBODY: REACTIVE — AB

## 2021-06-25 LAB — THYROID STIMULATING HORMONE WITH FREE T4 REFLEX: TSH: 1.566 u[IU]/mL (ref 0.430–3.550)

## 2021-06-25 MED ORDER — IBUPROFEN 400 MG TABLET
400.0000 mg | ORAL_TABLET | Freq: Four times a day (QID) | ORAL | Status: DC | PRN
Start: 2021-06-25 — End: 2021-06-27

## 2021-06-25 MED ORDER — LORAZEPAM 2 MG TABLET
2.0000 mg | ORAL_TABLET | ORAL | Status: DC | PRN
Start: 2021-06-25 — End: 2021-06-27
  Administered 2021-06-25: 2 mg via ORAL
  Filled 2021-06-25 (×2): qty 1

## 2021-06-25 MED ORDER — LORAZEPAM 0.5 MG TABLET
0.5000 mg | ORAL_TABLET | Freq: Three times a day (TID) | ORAL | Status: DC
Start: 2021-06-29 — End: 2021-06-27

## 2021-06-25 MED ORDER — NICOTINE (POLACRILEX) 2 MG BUCCAL LOZENGE
2.0000 mg | LOZENGE | BUCCAL | Status: DC | PRN
Start: 2021-06-25 — End: 2021-06-27
  Administered 2021-06-26: 2 mg via ORAL
  Filled 2021-06-25: qty 24

## 2021-06-25 MED ORDER — BUPRENORPHINE 2 MG-NALOXONE 0.5 MG SUBLINGUAL FILM
1.0000 | ORAL_FILM | Freq: Every day | SUBLINGUAL | Status: DC | PRN
Start: 2021-06-25 — End: 2021-06-25
  Administered 2021-06-25: 1 via SUBLINGUAL
  Filled 2021-06-25: qty 1

## 2021-06-25 MED ORDER — CLONIDINE HCL 0.1 MG TABLET
0.1000 mg | ORAL_TABLET | ORAL | Status: DC | PRN
Start: 2021-06-25 — End: 2021-06-27
  Administered 2021-06-25: 0 mg via ORAL
  Filled 2021-06-25: qty 1

## 2021-06-25 MED ORDER — NICOTINE (POLACRILEX) 2 MG GUM
2.0000 mg | CHEWING_GUM | BUCCAL | Status: DC | PRN
Start: 2021-06-25 — End: 2021-06-27
  Filled 2021-06-25: qty 1

## 2021-06-25 MED ORDER — BUPRENORPHINE 2 MG-NALOXONE 0.5 MG SUBLINGUAL FILM
1.0000 | ORAL_FILM | Freq: Three times a day (TID) | SUBLINGUAL | Status: DC | PRN
Start: 2021-06-25 — End: 2021-06-26

## 2021-06-25 MED ORDER — LORAZEPAM 1 MG TABLET
1.0000 mg | ORAL_TABLET | ORAL | Status: DC | PRN
Start: 2021-06-25 — End: 2021-06-27

## 2021-06-25 MED ORDER — ONDANSETRON 4 MG DISINTEGRATING TABLET
4.0000 mg | ORAL_TABLET | Freq: Three times a day (TID) | ORAL | Status: DC | PRN
Start: 2021-06-25 — End: 2021-06-27
  Administered 2021-06-25 (×2): 4 mg via ORAL
  Filled 2021-06-25 (×2): qty 1

## 2021-06-25 MED ORDER — THIAMINE MONONITRATE (VITAMIN B1) 100 MG TABLET
100.0000 mg | ORAL_TABLET | Freq: Every day | ORAL | Status: DC
Start: 2021-06-25 — End: 2021-06-27
  Administered 2021-06-25 – 2021-06-27 (×3): 100 mg via ORAL
  Filled 2021-06-25 (×3): qty 1

## 2021-06-25 MED ORDER — MULTIVITAMIN WITH FOLIC ACID 400 MCG TABLET
1.0000 | ORAL_TABLET | Freq: Every day | ORAL | Status: DC
Start: 2021-06-25 — End: 2021-06-27
  Administered 2021-06-25 – 2021-06-27 (×3): 1 via ORAL
  Filled 2021-06-25 (×3): qty 1

## 2021-06-25 MED ORDER — NICOTINE 21 MG/24 HR DAILY TRANSDERMAL PATCH
21.0000 mg | MEDICATED_PATCH | Freq: Every day | TRANSDERMAL | Status: DC
Start: 2021-06-25 — End: 2021-06-27
  Administered 2021-06-25 – 2021-06-27 (×3): 21 mg via TRANSDERMAL
  Filled 2021-06-25 (×3): qty 1

## 2021-06-25 MED ORDER — FOLIC ACID 1 MG TABLET
1.0000 mg | ORAL_TABLET | Freq: Every day | ORAL | Status: DC
Start: 2021-06-25 — End: 2021-06-27
  Administered 2021-06-25 – 2021-06-27 (×3): 1 mg via ORAL
  Filled 2021-06-25 (×3): qty 1

## 2021-06-25 MED ORDER — BUPRENORPHINE 2 MG-NALOXONE 0.5 MG SUBLINGUAL FILM
2.0000 | ORAL_FILM | Freq: Every day | SUBLINGUAL | Status: DC | PRN
Start: 2021-06-26 — End: 2021-06-25

## 2021-06-25 MED ORDER — LORAZEPAM 2 MG TABLET
2.0000 mg | ORAL_TABLET | ORAL | Status: AC
Start: 2021-06-25 — End: 2021-06-25
  Administered 2021-06-25: 2 mg via ORAL

## 2021-06-25 MED ORDER — LOPERAMIDE 2 MG CAPSULE
2.0000 mg | ORAL_CAPSULE | ORAL | Status: DC | PRN
Start: 2021-06-25 — End: 2021-06-27

## 2021-06-25 MED ORDER — LISINOPRIL 5 MG TABLET
30.0000 mg | ORAL_TABLET | Freq: Every day | ORAL | Status: DC
Start: 2021-06-25 — End: 2021-06-27
  Administered 2021-06-25 – 2021-06-27 (×3): 30 mg via ORAL
  Filled 2021-06-25 (×3): qty 2

## 2021-06-25 MED ORDER — CLONIDINE 0.1 MG/24 HR WEEKLY TRANSDERMAL PATCH
0.1000 mg | MEDICATED_PATCH | TRANSDERMAL | Status: DC
Start: 2021-06-25 — End: 2021-06-27
  Administered 2021-06-25: 0 mg via TRANSDERMAL

## 2021-06-25 MED ORDER — LORAZEPAM 1 MG TABLET
1.0000 mg | ORAL_TABLET | Freq: Three times a day (TID) | ORAL | Status: DC
Start: 2021-06-27 — End: 2021-06-27
  Administered 2021-06-27 (×2): 1 mg via ORAL
  Filled 2021-06-25 (×2): qty 1

## 2021-06-25 MED ORDER — LORAZEPAM 2 MG TABLET
2.0000 mg | ORAL_TABLET | Freq: Three times a day (TID) | ORAL | Status: AC
Start: 2021-06-25 — End: 2021-06-26
  Administered 2021-06-25 – 2021-06-26 (×6): 2 mg via ORAL
  Filled 2021-06-25 (×6): qty 1

## 2021-06-25 NOTE — Discharge Summary (Signed)
[facility]NOS[facility]IC UNIT DISCHARGE SUMMARY    PATIENT NAME:Steven Parrish  MRN:  [id number]  DOB:  [birth year]    ADMISSION DATE:  06/25/2021  DISCHARGE DATE: 06/27/2021    ATTENDING PHYSICIAN:  Judeth Porch, MD    PRIMARY CARE PROVIDER: No Pcp     REASON FOR ADMISSION: Alcohol use with withdrawal (CMS HCC) [F10.939]    DISCHARGE DIAGNOSES:   AUD - severe  OUD - severe  TUD - severe  Unspecified depressive disorder  Unspecified anxiety disorder  PTSD    DISCHARGE MEDICATIONS:     Current Discharge Medication List      START taking these medications.      Details   diclofenac sodium 1 % Gel  Commonly known as: VOLTAREN   2 g, Apply Topically, 3 TIMES DAILY PRN  Qty: 1 Each  Refills: 0     folic acid 1 mg Tablet  Commonly known as: FOLVITE   1 mg, Oral, DAILY  Qty: 30 Tablet  Refills: 0     multivitamin with folic acid 400 mcg Tablet  Commonly known as: THERA   1 Tablet, Oral, DAILY  Qty: 30 Tablet  Refills: 0     Suboxone 8-2 mg Film  Generic drug: buprenorphine-naloxone   0.5 Film, Sublingual, 3 TIMES DAILY  Qty: 8 Each  Refills: 0     thiamine mononitrate 100 mg Tablet   100 mg, Oral, DAILY  Qty: 30 Tablet  Refills: 0        CONTINUE these medications which have CHANGED during your visit.      Details   Lisinopril 30 mg Tablet  Commonly known as: PRINIVIL  What changed:    medication strength   how much to take   30 mg, Oral, DAILY  Qty: 30 Tablet  Refills: 0        CONTINUE these medications - NO CHANGES were made during your visit.      Details   acamprosate 333 mg Tablet, Delayed Release (E.C.)  Commonly known as: CAMPRAL   666 mg, Oral, 3 TIMES DAILY  Qty: 180 Tablet  Refills: 0        STOP taking these medications.    clindamycin 300 mg Capsule  Commonly known as: CLEOCIN        The patient is to continue these medications as prescribed until followup with monitoring provider unless otherwise stated above.    REASON FOR HOSPITALIZATION AND HOSPITAL COURSE:    51 y.o. male with a past  psychiatric history of AUD, OUD, TUD, and unspecified depressive/anxiety disorders who presented as a direct admission from the [place] on 06/25/21 for alcohol detoxification and opioid withdrawal. Reported he has been drinking 2-3 pints of liquor and injecting Dilaudid daily for the past 4 months, with use beginning in his 20's.     Patient was admitted to [facility] Unit on 06/25/2021 for alcohol and opioid detoxification. Patient was placed on suicide/elopement precautions and restricted to unit.     Over the course of hospitalization: although he denies a history of alcohol withdrawal related seizure, he does endorse history of seizures when his prescriptions for oxycodone and gabapentin were abruptly discontinued. He was started on an Ativan taper and tolerated well.     Although recommended and given psychoeducation on Suboxone, patient is firmly declining MOUD, stating this medication only worsens symptoms. He is however amenable to limited PRN medications to aid with opioid withdrawal symptoms (including PRN Suboxone  upon request), though has described aversion to "being thrown pills at" and feeling he is a "[place] pig". He does not feel that psychiatric or psychological treatment are efficacious for him, though concedes his prior strategies to maintain sobriety have been unsuccessful and is willing to consider other treatment options after acute detox. Patient deferred re-initiation of Antabuse. Also deferred re-initiation of naltrexone given concomitant opioid use burden.    Ultimately requested AMA discharge with PCP followup, though agreed to take Suboxone and Campral in this setting. AMA risks explained and understood. Has transportation, family support, and [facility]     Significant labs:  -CBC WNL at outside facility, including normothrombopenia  -urine drug screen positive for opioids  -BAL negative  -HCV Ab + (known), PCR pending at time of discharge  -HIV  negative  -HBV negative  -HFT stable  -HAV IGG Ab +  -TSH WNL    Psychotropics on admission  -none    Psychotropic changes during hospitalization  -Initiated Suboxone 4mg  TID + 4mg  daily PRN COWS >8 5/18 for OUD and chronic pain   Tolerated well, conceded modest efficacy  -Restarted previous trial of Campral 666mg  TID    -Declined further therapy as noted.    Psychotropics on discharge  -Suboxone 4mg  TID  -Campral 666mg  TID    On day of discharge, patient verbalized understanding of all medication changes and discharge instructions and denied active suicidal ideation, homicidal ideation, and auditory or visual hallucinations. Patient felt safe for discharge and was instructed to call or return to ED if safety concerns arose. Patient was discharged in stable medical and mental health condition.     Information for outpatient providers/transition of care:    For information related to pended results, call [phone]. If not otherwise specified in the Hospital Course, no studies were pended at the time of the discharge.    -Initially with sedated appearance on the unit though self-limited    -Mania screen negative on admission though reports father with bipolar disorder and fragmented sleep for multiple days; bears monitoring.    Unspecified depressive and anxiety disorders  PTSD  -Has declined further psychiatric and psychological treatment due to perceived inefficacy.     HTN  -Continued home lisinopril 30 mg daily - refilled on discharge.  -Rx had lapsed prior to admission, awaiting therapeutic benefit after re-initiation.  -HTN worsening likely in setting of opioid withdrawal for which patient has declined treatment (though now to accept Suboxone)  -Clonidine also available as above.    Cellulitis (historic)  Reported recent "heart attacks" without intervention  -Several recent ED visits for BUE wrist cellulitis at injection sites. Denies recent indication for treatment. Noninfectious appearing as documented.  Will monitor closely.  -EKG 5/17 with inferior infarct only, NSR, QTc appropriate.  -Maintain low threshold for cardiac/infectious workup if becomes symptomatic due to recent and recurrent cellulitis with risk of endocarditis.    Chronic neck and back pain  -Patient utilizing opioids for this reason, formerly on gabapentin as well  -Continue to encourage buprenorphine for chronic pain management (in addition to his OUD)  -Now plans to accept Suboxone as above.  -Has PRN medication as part of COWS protocol--not utilizing    -Has been offered pharmacotherapy for insomnia which he has declined for similar reasons as above. Counseled to seek ADHD evaluation outpatient, while adherent with Suboxone, and when demonstrating sobriety.    Excerpt from initial interview:  Patient recounts indication for admission including excessive and problematic  alcohol use. Doing this primarily to mitigate pain. Also endorses using "other drugs". Feels alcohol use has induced fatigue and somatic symptoms. He does not report any current cardiac symptoms. He is not able to report ever being told that he had a heart attack, but feels that previous unintentional overdoses have impacted his cardiac function [EKG completed showing old infarct only]. Denies recent cellulitis or treatment for it; feels chronic wounds are improving and has made effort to avoid injection at these sites.    When turned conversation to his mention of other drug use, he again endorses IV Dilaudid use. This is by crushing tablets which he purchases when he can afford them. Uses 24mg  daily or greater when he has the funds. Begins using as soon as he wakes and repeats through the day. Again recounted the difficulty he faced when his prescription access to Roxicodone 30 mg q4h and gabapentin 2000mg  daily were abruptly discontinued, and perceived unfairness of this.     States attending a methadone clinic was the worst decision he ever made, and again discussed  difficulty withdrawing from this medication. When asked why he withdrew, states he was attempting to self-taper while the clinic was recommending titration. States Suboxone was completely inefficacious for pain and that the packaging says "not to be used for treatment of pain". Discussed revision of [facility] and DHS guidelines advocating for this treatment of pain. He was not splitting his Suboxone dose. Asked if Suboxone had Narcan in it, and then stated he was not interested in this treatment despite worsening alcohol and opioid withdrawal symptoms at this time. Clarifies that he does not perceive to have ever had a withdrawal seizure related to alcohol--feels they were due to opioid use (has taken Tramadol historically) and potentially gabapentin. Asked about gabapentin for alcohol withdrawal though appeared receptive counseling on indication for Ativan.    Regarding mood, states he is feeling irritable in this setting. Made a parasuicidal comment relating to how he may feel in the future, but categorically denies current suicidality and contracts to safety on the unit. He again spoke derisively about his perception of psychiatric and psychological care, though independently stated he would attend outpatient care when asked about his long-term goals from this admission. States anyone would feel the way he does given his pain and withdrawal symptoms. Made a comment relating to a recommendation for "giving people schizophrenic medicine for chronic pain in the ", was unable to elaborate on this further.     Metabolic Screening/LAB TESTING:  BP Readings from Last 1 Encounters:   06/27/21 (!) 158/110     Wt Readings from Last 1 Encounters:   06/25/21 95.7 kg (211 lb)     Ht Readings from Last 1 Encounters:   06/25/21 1.803 m (5\' 11" )     Body mass index is 29.43 kg/m.  No results found for: HA1C  No results found for: GLUCOSEFAST  No results found for: CHOLESTEROL, HDL, LDLCHOL, LDLCHOLDIR, TRIG  Hepatic Function    Lab  Results   Component Value Date/Time    ALBUMIN 3.7 06/25/2021 06:54 AM    TOTALPROTEIN 7.6 06/25/2021 06:54 AM    ALKPHOS 176 (H) 06/25/2021 06:54 AM    Lab Results   Component Value Date/Time    AST 39 06/25/2021 06:54 AM    ALT 32 06/25/2021 06:54 AM    BILIRUBINCON 0.3 06/25/2021 06:54 AM        BP (Non-Invasive): (!) 158/110    CONDITION ON DISCHARGE:  Ambulation:  Full  Self-care Ability: Complete  Cognitive Status AOx3   DNR status at discharge: full code    DISCHARGE DISPOSITION: Home with support from family    DISCHARGE INSTRUCTIONS:  CRISIS TEXT LINE  Confidential, Free, 24 hours a day, 7 days a week  Text "START" to [phone],someone will text you back    OUTPATIENT FOLLOW UP:   [facility]  -[facility]one refilled until this date [facility] discussion with [facility], and [facility] [facility].    Copies sent to Care Team       Relationship Specialty Notifications Start End    Pcp, No PCP - General   04/10/21     Katina DungKhan, Waheed, MD PCP - Managed Care/Insurance EXTERNAL  10/21/15     Phone: [phone] Fax: [phone]         [address] [place] [id number]      Referring providers can utilize [email or web address] to access their referred [facility] patient's information or call [phone] to contact this provider.  For information related to inpatient admission and pended results, call [phone]. Available 24 hours a day, 7 days a week.     Kathlene November/Chris Feghali, MD (he/him)  06/27/2021, 10:58  Behavioral Medicine & Psychiatry, PGY-2    I saw and examined the patient on day of discharge. I was present and participated in the development of the treatment plan. I reviewed the resident's note. I agree with the findings and plan of care as documented in the resident's note. Any exceptions/additions are edited/noted.     Judeth Porchorothy van Oppen, MD 06/27/2021 13:44  Attending Physician

## 2021-06-25 NOTE — Progress Notes (Signed)
Multi state BOP including [facility] for past 10 years:                      Multi State Controlled Substance Patient Full Name Report Report Date 06/25/2021   From 06/10/2011 To 06/25/2021 Date of Birth 02-Apr-1970   Prescription Count 23   Last Name Parrish First Name Steven Middle Name                   Patients included in report that appear to match the search criteria.   Last Name First Name Middle Name Gender Address         Prescriber Name Prescriber DEA & Zip Dispenser Name Dispenser DEA & Zip Rx Written Date Rx Dispense Date & Date Sold Rx Number Product Name Strength Qty Days # of Refill Sched Payment Type   LENARD, KAMPF   (Reported to [facility]   [place], [id number] [facility] [id number] [phone] 02/21/2018 02/21/2018 02/21/2018 [id number] SUBOXONE FILM 8 mg/1; 2 mg/1 16 8  0/0 CIII Insurance   Gayland Curry  [place]    Np [id number] [phone] [id number] [phone] 02/19/2018 02/19/2018 02/19/2018 [id number] TRAMADOL HYDROCHLORIDE                      50  MG         12 3 0/0 CIV Medicaid   Jearld Pies    Np [id number] [phone] [id number] [phone] 04/19/2017 04/19/2017 04/19/2017 [id number] TRAMADOL HCL 50 MG 12 3 0/0 CIV Medicaid   Weyman Pedro Edison) [id number] [phone] [facility] [id number] [phone] 07/01/2016 07/01/2016 07/01/2016 [id number] Hydrocodone Bitartrate And Acetaminophen 7.5-325 MG 1 1 0/0 CII Medicaid   Tenenbaum,  Romero   (Reported to Other States)   Atilano Ina [id number] [facility] [id number] [phone] 06/27/2013 06/27/2013 06/27/2013 [id number] OXYCODONE HCL 30 MG TABLET  120 30 0/0 CII    Atilano Ina [id number] [facility] [id number] [phone] 06/27/2013 06/27/2013 06/27/2013 [id number] OXYCODONE HCL 30 MG TABLET  48 30 0/0 CII    Atilano Ina [id number] [facility] [id number] [phone] 05/30/2013 05/30/2013 05/30/2013 [id number] OXYCODONE HCL 30 MG TABLET  120 30 0/0 CII    Atilano Ina [id number] [facility] [id number] [phone] 05/30/2013  05/30/2013 05/30/2013 [id number] OXYCODONE HCL 30 MG TABLET  48 30 0/0 CII    Atilano Ina [id number] [facility] [id number] 04/24/2013 05/02/2013 05/02/2013 [id number] OXYCODONE HCL 30 MG TABLET  120 30 0/0 CII    Atilano Ina [id number] [facility] [id number] 04/24/2013 05/02/2013 05/02/2013 [id number] OXYCODONE HCL 30 MG TABLET  48 30 0/0 CII    Atilano Ina [id number] [id number] [facility] [id number] [phone] 04/04/2013 04/04/2013 04/04/2013 [id number] OXYCODONE HCL 30 MG TABLET  120 20 0/0 CII    Pearson Picou [id number] [facility] [id number] [phone] 04/04/2013 04/04/2013 04/04/2013 [id number] OXYCODONE HCL 30 MG TABLET  48 8 0/0 CII    Chucky Homes [id number] [facility] [id number] 03/07/2013 03/07/2013 03/07/2013 [id number] OXYCODONE HCL 30 MG TABLET  120 30 0/0 CII    Ruthvik Barnaby [id number] [id number] [facility] [id number] [phone] 03/07/2013 03/07/2013 03/07/2013 [id number] OXYCODONE HCL 30 MG TABLET  48 30 0/0 CII    Mychal Decarlo [id number] [id number] [facility] [id number] [phone] 01/27/2013 02/06/2013 02/06/2013 [id number] OXYCODONE HCL 30 MG TABLET  120  30 0/0 CII    Koen Antilla [id number] [facility] [id number] [phone] 01/27/2013 02/06/2013 02/06/2013 [id number] OXYCODONE HCL 30 MG TABLET  20 30 0/0 CII    Elwood Bazinet [id number] [facility] [id number] [phone] 01/10/2013 01/10/2013 01/10/2013 [id number] OXYCODONE HCL 30 MG TABLET  20 4 0/0 CII    Tedric Leeth [id number] [facility] [id number] [phone] 01/10/2013 01/10/2013 01/10/2013 [id number] OXYCODONE HCL 30 MG TABLET  120 20 0/0 CII    Kincade Granberg [id number] [facility] [id number] [phone] 12/13/2012 12/13/2012 12/13/2012 [id number] OXYCODONE HCL 30 MG TABLET  20 4 0/0 CII    Gomer France [id number] [id number] [facility] [id number] [phone] 12/13/2012 12/13/2012 12/13/2012 [id number] OXYCODONE HCL 30 MG TABLET  120 20 0/0 CII    Berwyn Bigley [id number] [facility] [id number] [phone] 11/15/2012 11/15/2012 11/15/2012 [id number] OXYCODONE HCL 30 MG TABLET  140 23 0/0 CII     Wendell Nicoson [id number] [id number] [facility] [id number] [phone] 10/18/2012 10/18/2012 10/18/2012 [id number] OXYCODONE HCL 30 MG TABLET  112 28 0/0 CII    Garion Wempe [id number] [id number] [facility] [id number] [phone] 09/20/2012 09/20/2012 09/20/2012 [id number] OXYCODONE HCL 30 MG TABLET  84 28 0/0 CII

## 2021-06-25 NOTE — Progress Notes (Signed)
[facility]   Dual Diagnosis Unit - Inpatient Progress Note    Identification:  Name:  Steven Parrish  Sex assigned at birth: male  Age: 51 y.o.  DOB:  May 04, 1970  DOS:  06/25/21  MRN:  [id number]    Chief Concern/Presenting Problems: alcohol detoxification, opioid withdrawal    Subjective:    Per staff report: Vital signs were within normal limits. All medications were given as scheduled per [facility]. Regarding PRN medications, none utilized. CIWA was 4. COWS was 6. Nighttime admission. Has been calm and cooperative though concern for lethargy. AM labs drawn--BMP with Mg 1.7 though hypokalemia resolved. HFT stable.    On rounds, patient recounts indication for admission including excessive and problematic alcohol use. Doing this primarily to mitigate pain. Also endorses using "other drugs". Feels alcohol use has induced fatigue and somatic symptoms. He does not report any current cardiac symptoms. He is not able to report ever being told that he had a heart attack, but feels that previous unintentional overdoses have impacted his cardiac function [EKG completed showing old infarct only]. Denies recent cellulitis or treatment for it; feels chronic wounds are improving and has made effort to avoid injection at these sites.    When turned conversation to his mention of other drug use, he again endorses IV Dilaudid use. This is by crushing tablets which he purchases when he can afford them. Uses 24mg  daily or greater when he has the funds. Begins using as soon as he wakes and repeats through the day. Again recounted the difficulty he faced when his prescription access to Roxicodone 30 mg q4h and gabapentin 2000mg  daily were abruptly discontinued, and perceived unfairness of this.     States attending a methadone clinic was the worst decision he ever made, and again discussed difficulty withdrawing from this medication. When asked why he withdrew, states he was attempting to self-taper while the clinic  was recommending titration. States Suboxone was completely inefficacious for pain and that the packaging says "not to be used for treatment of pain". Discussed revision of VA and DoD guidelines advocating for this treatment of chronic pain. He was not splitting his Suboxone dose. Asked if Suboxone had Narcan in it, and then stated he was not interested in this treatment despite worsening alcohol and opioid withdrawal symptoms at this time. Provided education that naloxone is in the formulation as a deterrent to injection but that if taken correctly the body only sees the buprenorphine. Clarifies that he does not perceive to have ever had a withdrawal seizure related to alcohol--feels they were due to opioid use (has taken Tramadol historically) and potentially gabapentin. Asked about gabapentin for alcohol withdrawal though appeared receptive counseling on indication for Ativan.    Regarding mood, states he is feeling irritable in this setting. Made a parasuicidal comment relating to how he may feel in the future, but categorically denies current suicidality and contracts to safety on the unit. He again spoke derisively about his perception of psychiatric and psychological care, though independently stated he would attend outpatient care when asked about his long-term goals from this admission. States anyone would feel the way he does given his pain and withdrawal symptoms. Made a comment relating to a recommendation for "giving people schizophrenic medicine for chronic pain in the ", was unable to elaborate on this further. Denies additional concerns or topics of conversation for today.      Objective:    Vitals:    06/25/21 0245 06/25/21  0700   BP: (!) 142/105 (!) 143/111   Pulse: 88 87   Resp: 18 18   Temp: 36.6 C (97.8 F) 36.1 C (97 F)   SpO2: 97% 95%   Weight: 95.9 kg (211 lb 6.7 oz)    Height: 1.803 m ( )    BMI: 29.55      Mental Status Exam:  Appearance:  casually dressed, dressed appropriately,  well groomed, well-nourished, appears older than actual age, and no apparent distress  Level of Consciousness: alert  Eye Contact:  good   Motor:  no psychomotor slowing or agitation    Speech: rate: slow, rhythm: normal, volume: loud, and quality: garrulous   Behavior:  cooperative and fair social reciprocity--can be defiant when expressing his belief system  Orientation: intact  Attention:  good   Memory:  grossly normal  Knowledge: fair and consistent with education--appears to have researched extensively and holds firm beliefs  Thought Process:  linear  Thought Content:  no paranoia or delusions  Perception:  no hallucinations endorsed and not responding to internal stimuli  Insight:  fair.    Judgement:  fair.    Suicidal Ideation:  none.    Homicidal Ideaton:  None expressed  Mood:  frustrated, tired and complacent  Affect:  congruent to mood, normal range, reactive, slight lability    ROS: Negative. Any positives noted in subjective.      Physical examination:  -Bilateral wrists (historic sites of cellulitis) without erythema, ecchymoses, lesions, or other skin changes. Dorsal surfaces of both appear chronically raised as corroborated by patient. No apparent active infection.    Current Medications:  Current Facility-Administered Medications   Medication Dose Route Frequency   . cloNIDine (CATAPRES) tablet  0.1 mg Oral Q4H PRN   . folic acid (FOLVITE) tablet  1 mg Oral Daily   . ibuprofen (MOTRIN) tablet  400 mg Oral Q6H PRN   . lisinopriL (PRINIVIL) tablet 30 mg  30 mg Oral Daily   . loperamide (IMODIUM) capsule  2 mg Oral Q4H PRN   . LORazepam (ATIVAN) tablet  1 mg Oral Q2H PRN    Or   . LORazepam (ATIVAN) tablet  2 mg Oral Q2H PRN   . LORazepam (ATIVAN) tablet  2 mg Oral Q8HR    Followed by   . [START ON 06/27/2021] LORazepam (ATIVAN) tablet  1 mg Oral Q8HR    Followed by   . [START ON 06/29/2021] LORazepam (ATIVAN) tablet  0.5 mg Oral Q8HR   . multivitamin (THERA) tablet  1 Tablet Oral Daily   . nicotine  (NICODERM CQ) transdermal patch (mg/24 hr)  21 mg Transdermal Daily   . nicotine polacrilex (COMMIT) lozenge  2 mg Oral Q1H PRN   . nicotine polacrilex (NICORETTE) chewing gum  2 mg Oral Q1H PRN   . ondansetron (ZOFRAN ODT) rapid dissolve tablet  4 mg Oral Q8H PRN   . thiamine-vitamin B1 tablet  100 mg Oral Daily     Recent labs:  CBC WNL at outside facility  urine drug screen positive for opioids  Labs drawn at this facility as below    BAL: negative    EKG:  No results found for this or any previous visit (from the past 720 hour(s)).   5/17: NSR, inferior infarct, QTc appropriate.    Assessment:   Naquan Garman is a 51 y.o. male PPH AUD, OUD, TUD, and unspecified depressive/anxiety disorders who presented as a direct admission from the [facility] [place] on  06/25/21 for alcohol detoxification and opioid withdrawal. Reported he has been drinking 2-3 pints of liquor and injecting Dilaudid daily for the past 4 months, with use beginning in his 20's. Although he denies a history of alcohol withdrawal related seizure, he does endorse history of seizures when his prescriptions for oxycodone and gabapentin were abruptly discontinued. He has been started on an Ativan taper and appears to be tolerating well. Although recommended and given psychoeducation on Suboxone, patient is firmly declining MOUD. He is however amenable to PRN medications to aid with opioid withdrawal symptoms (including PRN Suboxone upon request). He does not feel that psychiatric or psychological treatment are efficacious for him, though concedes his prior strategies to maintain sobriety have been unsuccessful and is willing to consider other treatment options after acute detox.    Psychiatric diagnoses: AUD - severe; OUD - severe; TUD - severe; unspecified depressive disorder, unspecified anxiety disorder, PTSD  Medical diagnoses: TBI, HTN, chronic neck and back pain, esophageal varices, HCV, AFLD, allergic rhinitis, hypomagnesemia, IVDU, prior  cellulitis  Medications on Admission: none  Medication Trials: Vistaril, Celexa, Remeron, Suboxone, Zoloft, Elavil, Minipress, Campral (somewhat helpful), methadone, oxycodone, tramadol, gabapentin    Plan:  Alcohol use disorder Alcohol withdrawal  - Continue standard 6-day Ativan taper (last dose 5/23 @ 0001)  - Ativan PRN CIWA - not requiring    - Continue MVI, folate, thiamine  - Discuss MAT options including Campral, naltrexone, and Antabuse    Opioid use disorder - severe  - Symptomatic opioid withdrawal management medications (not utilizing):  - Clonidine transdermal patch 0.1 mg Q7 days  - Clonidine 0.1 mg Q4HR PRN  - Ibuprofen 400 mg Q6HR PRN  - loperamide 2 mg Q4HR PRN   - Ondansetron 4 mg Q8HR PRN    - Long term MOUD: has declined over repeated counseling--continues to plan to use Dilaudid.    -Ordered Suboxone PRN as follows:   5/17: 2mg  daily PRN if COWS >8 and patient requests   5/18: 4mg  daily PRN if tolerated previous PRN well    -Initial and transient concern for sedation; monitoring closely     Tobacco use disorder - severe  - Patient smokes 1PPD  - NRT with 21 mg/24hr patch + PRN lozenges/gum    Unspecified depressive and anxiety disorders  PTSD  -Has declined further psychiatric and psychological treatment due to perceived inefficacy. Contracts to safety on the unit.    HTN  - Continued home lisinopril 30 mg daily  -Rx had lapsed prior to admission, will monitor closely    Cellulitis (historic)  Reported recent "heart attacks" without intervention  -Several recent ED visits for BUE wrist cellulitis at injection sites. Denies recent indication for treatment. Noninfectious appearing as documented. Will monitor closely.  -EKG 5/17 with inferior infarct only, NSR, QTc appropriate.  -Maintaining low threshold for cardiac/infectious workup if becomes symptomatic due to recent and recurrent cellulitis with risk of endocarditis.    Chronic neck and back pain  -Patient utilizing opioids for this reason,  formerly on gabapentin as well  -Continue to encourage buprenorphine for chronic pain management (in addition to his OUD)  -Declines long term MOUD but PRN Suboxone available as above  -Has PRN medication as part of COWS protocol--not utilizing thus far  -PRN Suboxone offered  as above    Infectious Disease Screening Abnormal studies  Esophageal varices, HCV, AFLD  - Admission: HCV Ab positive (known), HIV negative  - HBV negative  - Normothrombopenic  - HFT stable  -  HAV IGG Ab positive    Psychosocial and Discharge Considerations:  - Continue psychosocial activities and therapy  - Therapist to speak with collateral as warranted  - Safety planning will be completed prior to discharge  - Prior veteran with VA insurance (and access to [place] services)  - Patient uncertain of what treatment he desires after acute detox      DMC: Retains  Restrictions: RTU/SP/EP (ok to go off unit to mutual support meetings)  Disposition:  pending psychiatric and medical stabilization.     Kathlene November, MD (he/him)  06/25/2021, 07:40  Behavioral Medicine & Psychiatry, PGY-2    I saw and examined the patient. I was present and participated in the development of the treatment plan. I reviewed the resident's note. I agree with the findings and plan of care as documented in the resident's note. Any exceptions/additions are edited/noted.     Continue to encourage buprenorphine for both OUD and chronic pain. Additionally, it would offer significant harm reduction given that patient reported multiple prior accidental overdoses. Discussed the updated VA and DoD guidelines recommending buprenorphine for chronic pain management over full agonist opioids.     Judeth Porch, MD 06/25/2021 16:28  Attending Physician

## 2021-06-25 NOTE — Care Plan (Signed)
Pt observed spending the majority of the shift sleeping and/or isolative to his room.  Pt met with this Probation officer for a 1:1 session, but he did not attend any unit programming today.  Psychosocial and SAFE-T completed; documentation will be uploaded tomorrow morning.  Pt reports he is giving thought to various f/u options; states he is slightly interested in exploring rehab options further.  However, he says he is "antisocial", leading him to "stay away from the crowds and their problems."  During 1:1, the importance of human connection in recovery was emphasized.  Pt also corroborated previous statements made regarding his substance use.  Pt's mood was depressed/anxious and affect was consistent with mood.  Plan is to continue to encourage pt to attend unit programming until discharge.    Camelia Eng, CT  06/25/2021, 17:57

## 2021-06-25 NOTE — Care Plan (Addendum)
Pt compliant with meds. Mouth check and safety checks complete. Vitals stable. Patient states no needs or concern at this time. Monitoring WD symptoms with PRN medications at this time. Pt agreed to take Suboxone due to symptoms worsening. Denies SI/HI/AVH. Able to make needs known.      BP (Non-Invasive): (!) 162/94 Temperature: 36.2 C (97.2 F) Heart Rate: 79 Respiratory Rate: 16 SpO2: 98 %  Height: 180.3 cm (5\' 11" ) Weight: 95.7 kg (211 lb) Body mass index is 29.43 kg/m.    , RN    Problem: Adult Behavioral Health Plan of Care  Goal: Patient-Specific Goal (Individualization)  Outcome: Ongoing (see interventions/notes)  Flowsheets (Taken 06/25/2021 1347)  Individualized Care Needs: Detox from opiates  Anxieties, Fears or Concerns:  06/27/2021 Very nauseous  . Apprehensive about starting suboxone  Patient-Specific Goals (Include Timeframe): Pt will attend groups once physically able to do so.     Problem: Excessive Substance Use  Goal: Optimized Energy Level (Excessive Substance Use)  Outcome: Ongoing (see interventions/notes)  Goal: Increased Participation and Engagement (Excessive Substance Use)  Outcome: Ongoing (see interventions/notes)  Intervention: Facilitate Participation and Engagement  Recent Flowsheet Documentation  Taken 06/25/2021 0800 by 06/27/2021, RN  Supportive Measures:  . active listening utilized  . verbalization of feelings encouraged

## 2021-06-25 NOTE — Nurses Notes (Signed)
Labs drawn and sent by staff.

## 2021-06-25 NOTE — Nurses Notes (Signed)
Was in dayroom at 1915. B.P. 178/134 pulse 102. Was  Moist on forehead. , and had visible tremor. Advised of  Elevation in vital signs. Ativan 2 mg. Given p.o. for COWS 12 and CIWA 9. Thanked Therapist, sports for concern and took medication. Talked To Mother on phone and did some reading. 2100 Refused any medication for B.P. that is ordered. Remains hypertensive (see flow sheet) ,but denies headache, and states he does not want to be a "[place] Pig". 2200 Remains up in dayroom. 15 minute safety rounds continue, V.M.T. on unit.Blood pressure (!) 183/140, pulse (!) 111, temperature 36.4 C (97.5 F), resp. rate 18, height 1.803 m (5\' 11" ), weight 95.7 kg (211 lb), SpO2 98 %.  [place] Boos, RN

## 2021-06-25 NOTE — Behavioral Health (Signed)
Spoke with [employer] for the [place] to get patient coverage approved through the [employer]. Provided NPI number for provider and hospital as well as Tax ID. Informed if any more questions needed to call back and writer will assist in best way possible.    Neill Loft, DISCHARGE PLANNER

## 2021-06-25 NOTE — Ancillary Notes (Signed)
SAFE-T Protocol with C-SSRS ([place] Risk and Protective Factors) - Recent    Step 1: Identify Risk Factors                                                   C-SSRS Suicidal Ideation Severity Month   1) Wish to be dead  Have you wished you were dead or wished you could go to sleep and not wake up? no   2) Current suicidal thoughts  Have you actually had any thoughts of killing yourself? no     3) Suicidal thoughts w/ Method (w/no specific Plan or Intent or act)  Have you been thinking about how you might do this? no   4) Suicidal Intent without Specific Plan  Have you had these thoughts and had some intention of acting on them? no   5) Intent with Plan  Have you started to work out or worked out the details of how to kill yourself? Do you intend to carry out this plan? no   C-SSRS Suicidal Behavior: "Have you ever done anything, started to do anything, or prepared to do anything to end your life?"    Examples: Collected pills, obtained a gun, gave away valuables, wrote a will or suicide note, took out pills but didn't swallow any, held a gun but changed your mind or it was grabbed from your hand, went to the roof but didn't jump; or actually took pills, tried to shoot yourself, cut yourself, tried to hang yourself, etc.  If "YES" Was it within the past 3 months? Lifetime    no    Past 3 Months    no   Activating Events:     Recent losses or other significant negative event(s) (legal, financial, relationship, etc.)    Treatment History:     Previous psychiatric diagnosis and treatments    Other:     Clinical Status:     Chronic physical pain or other acute medical problem (e.g. CNS disorders), Highly Impulsive Behavior, Substance abuse or dependence, Aggressive behavior towards others, Hx of trauma and abuse       Access to lethal methods: Ask specifically about presence or absence of a firearm in the home or ease of accessing:    {YES/NO:21365}     Step 2: Identify Protective Factors (Protective factors may not  counteract significant acute suicide risk factors)   Internal:     Identifies reasons for living External:     {Responsibility to family or others; living with family and Supportive social network of family or friends     Step 3: Specific questioning about Thoughts, Plans, and Suicidal Intent - (see Step 1 for Ideation Severity and Behavior)   C-SSRS Suicidal Ideation Intensity (with respect to the most severe ideation 1-5 identified above) Month   Frequency  How many times have you had these thoughts?     Duration  When you have the thoughts how long do they last?    Controllability  Could/can you stop thinking about killing yourself or wanting to die if you want to?    Deterrents  Are there things - anyone or anything (e.g., family, religion, pain of death) - that stopped you from wanting to die or acting on thoughts of suicide?    Reasons for Ideation  What sort of reasons did you have for  thinking about wanting to die or killing yourself?  Was it to end the pain or stop the way you were feeling (in other words you couldn't go on living with this pain or how you were feeling) or was it to get attention, revenge or a reaction from others? Or both?    Total Score                    N/A         Step 4: Guidelines to Determine Level of Risk and Develop Interventions to LOWER Risk Level  "The estimation of suicide risk, at the culmination of the suicide assessment, is the quintessential clinical judgment, since no study has identified one specific risk factor or set of risk factors as specifically predictive of suicide or other suicidal behavior."   From The American Psychiatric Association Practice Guidelines for the Assessment and Treatment of Patients with Suicidal Behaviors, page 24.   RISK STRATIFICATION TRIAGE   High Suicide Risk  Suicidal ideation with intent or intent with plan in past month (C-SSRS Suicidal Ideation #4 or #5)  Or  Suicidal behavior within past 3 months (C-SSRS Suicidal Behavior)   Initiate  local psychiatric admission process  Stay with patient until transfer to higher level of care is complete  Follow-up and document outcome of emergency psychiatric evaluation     Moderate Suicide Risk  Suicidal ideation with method, WITHOUT plan, intent or behavior       in past month (C-SSRS Suicidal Ideation #3)  Or  Suicidal behavior more than 3 months ago (C-SSRS Suicidal Behavior Lifetime)  Or  Multiple risk factors and few protective factors Directly address suicide risk, implementing suicide     prevention strategies  Develop Safety Plan     Low Suicide Risk  Wish to die or Suicidal Ideation WITHOUT method, intent, plan or behavior (C-SSRS Suicidal Ideation #1 or #2)   Or  Modifiable risk factors and strong protective factors  Or  No reported history of Suicidal Ideation or Behavior  Discretionary Outpatient Referral     Step 5: Documentation   Risk Level :  Low Suicide Risk     Clinical Note:    Clinical Note:  Pt denies any SI.  He denies hx of SAs, NSSI, or family hx of suicide.      SAFE-T (Suicide Risk Assessment/Intervention)    Risk Factors: {Risk Factors for Suicide :34933}    Protective Factors: sense of responsibility to loved ones, social supports, expressive of needs/emotions, resourceful, stable living environment     Risk Level:   LOW RISK = This is because protective factors that outweigh risk factor OR thoughts of death without plan, intent, or behaviors.    Based on assessment and given the above risk level the following plan of care was developed:     Patient has been admitted for acute inpatient psychiatric treatment  Patient was able to contract for safety.   Safety controlled by monitoring within safe milieu with 15 minutes checks and video monitoring on unit  Daily treatment team, group and individual therapies to be implemented  Patient restricted to the unit (RTU)  Safe Checks performed to validate patients well-being  Behavioral health appointments to be arranged at time of  discharge  Contact with supportive persons in their life was encouraged. Patient identifies father as a person of trust and support.        Camelia Eng, CT  06/25/2021, 10:40

## 2021-06-25 NOTE — Nurses Notes (Signed)
[facility] Management Initial Evaluation    Patient Name: Steven Parrish  Date of Birth: 13-Apr-1970  Sex: male  Date/Time of Admission: 06/25/2021  3:06 AM  Room/Bed: H245/A  Payor: [place] / Plan: [place] / Product Type: Medicaid [facility] /   Primary Care Providers:  Pcp, No              (General)  Dorothe Pea, MD, MD (Managed Care/Insurance)    Pharmacy Info:   [id number] - [place], [place] - [place] [address] AT [facility] [place] [facility] [address]    [address] [place] [address]    Phone: [phone] Fax: [phone]    Hours: Not open 24 hours          Emergency Contact Info:   No emergency contact information on file.    History:   Steven Parrish is a 51 y.o., male, admitted 06/25/2021    Height/Weight: 180.3 cm (5\' 11" ) / 95.9 kg (211 lb 6.7 oz)     LOS: 0 days   Admitting Diagnosis: Alcohol use with withdrawal (CMS [facility]) [F10.939]    Assessment:      06/25/21 0800   Assessment Details   Assessment Type Admission   Date of Care Management Update 06/25/21   Date of Next DCP Update 06/27/21   Readmission   Is this a readmission? No   Insurance Information/Type   Insurance type Other (see comments)  ([place])   Employment/Financial   Patient has Prescription Coverage?  Yes        Name of Insurance Coverage for Medications VA Insurance or Health Plan Medicaid   Financial Concerns unable to pay bills   Living Environment   Lives With parent(s)   Living Arrangements mobile home   Home Safety   Home Assessment: No Problems Identified   Home Accessibility no concerns   Care Management Plan   Discharge Planning Status initial meeting   Discharge plan discussed with: Patient   Discharge Needs Assessment   Outpatient/Agency/Support Group Needs outpatient psychiatric care;outpatient substance abuse treatment   Equipment Currently Used at Home none   Equipment Needed After Discharge none   Discharge Facility/Level of Care Needs  Undetermined at this time   Transportation Available Medicaid transportation;agency transportation;other (see comments)   Referral Information   Admission Type inpatient   Address Verified verified-no changes   Arrived From acute hospital, other   [place] Other - See Comments  [facility]   ADVANCE DIRECTIVES   Does the Patient have an Advance Directive? No, Information Offered and Refused   Does the Patient have a Psychiatric Advance Directive? No   Patient Requests Assistance in Having Advance Directive Notarized. N/A   LAY CAREGIVER    Appointed Lay Caregiver? I Decline         Discharge Plan:  Undetermined at this time      The patient will continue to be evaluated for developing discharge needs.     Case Manager: Lita Mains, Isla Pence PLANNER  Phone: [phone]

## 2021-06-25 NOTE — Care Plan (Signed)
Problem: Adult Behavioral Health Plan of Care  Goal: Plan of Care Review  Outcome: Ongoing (see interventions/notes)  Goal: Patient-Specific Goal (Individualization)  Outcome: Ongoing (see interventions/notes)  Flowsheets (Taken 06/25/2021 0310)  Individualized Care Needs: medications adjusted  Anxieties, Fears or Concerns: length of stay  Patient-Specific Goals (Include Timeframe): get sober  Goal: Adheres to Safety Considerations for Self and Others  Intervention: Develop and Maintain Individualized Safety Plan  Flowsheets  Taken 06/25/2021 0403  Safety Measures:   safety rounds completed   belongings check completed   environmental rounds completed  Taken 06/25/2021 0354  Safety Measures: safety rounds completed  Goal: Absence of New-Onset Illness or Injury  Intervention: Identify and Manage Fall Risk  Recent Flowsheet Documentation  Taken 06/25/2021 0403 by Jennette Dubin, RN  Safety Measures:   safety rounds completed   belongings check completed   environmental rounds completed  Taken 06/25/2021 0354 by Jennette Dubin, RN  Safety Measures: safety rounds completed  Goal: Develops/Participates in Therapeutic Alliance to Support Successful Transition  Intervention: [place] Documentation  Taken 06/25/2021 0313 by Jennette Dubin, RN  Trust Relationship/Rapport: care explained   Admission--142/105--88--Cows=6--Ciwa=25--51 year old male admitted to the [place] from [facility] for detox from alcohol and opioids. Patient used dilaudid 24 mg IV before going to the ER in [place] at 4 pm. Patient drinks 2-3 pints of Vodka most days. Patient has a history of chronic back pain and states "the only thing that helps my pain is opioids." Patient was at the [place] in [place] one time about two years ago for 8-9 days for detox and starting using immediately after discharge. On arrival to the unit patient has been calm and cooperative. Resident on  call notified of patient arrival. Resident assessed patient on the unit and placed orders. Patient given a now dose of ativan 2 mg at 0400. Fifteen minute checks initiated for safety. Vmt on unit all shift. Patient offered food and beverages. Will continue with plan of care.

## 2021-06-25 NOTE — Nurses Notes (Addendum)
COWS 13 at this time. BP elevated. Pt C/O of sweating, irritability, restless legs and nausea. COWS 14 at this time. Refused clonidine, states staff "just throws pills at him". Declines wanting another dose of Suboxone, states it "made things worse". Given Zofran.    Lonia Mad, RN
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
## 2021-06-26 MED ORDER — BUPRENORPHINE 2 MG-NALOXONE 0.5 MG SUBLINGUAL FILM
2.0000 | ORAL_FILM | Freq: Three times a day (TID) | SUBLINGUAL | Status: DC
Start: 2021-06-26 — End: 2021-06-27
  Administered 2021-06-26 (×2): 2 via SUBLINGUAL
  Administered 2021-06-27: 0 via SUBLINGUAL
  Administered 2021-06-27: 2 via SUBLINGUAL
  Filled 2021-06-26 (×3): qty 2

## 2021-06-26 MED ORDER — BUPRENORPHINE 2 MG-NALOXONE 0.5 MG SUBLINGUAL FILM
1.0000 | ORAL_FILM | Freq: Every day | SUBLINGUAL | Status: DC | PRN
Start: 2021-06-26 — End: 2021-06-27

## 2021-06-26 MED ORDER — DICLOFENAC 1 % TOPICAL GEL
2.0000 g | Freq: Three times a day (TID) | CUTANEOUS | Status: DC
Start: 2021-06-26 — End: 2021-06-27
  Administered 2021-06-26: 0 g via TOPICAL
  Administered 2021-06-27 (×2): 2 g via TOPICAL
  Filled 2021-06-26: qty 100

## 2021-06-26 MED ORDER — BUPRENORPHINE 2 MG-NALOXONE 0.5 MG SUBLINGUAL FILM
2.0000 | ORAL_FILM | SUBLINGUAL | Status: AC
Start: 2021-06-26 — End: 2021-06-26
  Administered 2021-06-26: 2 via SUBLINGUAL
  Filled 2021-06-26: qty 2

## 2021-06-26 MED ORDER — BUPRENORPHINE 2 MG-NALOXONE 0.5 MG SUBLINGUAL FILM
2.0000 | ORAL_FILM | Freq: Three times a day (TID) | SUBLINGUAL | Status: DC | PRN
Start: 2021-06-26 — End: 2021-06-26

## 2021-06-26 MED ORDER — ACAMPROSATE 333 MG TABLET,DELAYED RELEASE
666.0000 mg | DELAYED_RELEASE_TABLET | Freq: Three times a day (TID) | ORAL | Status: DC
Start: 2021-06-26 — End: 2021-06-26

## 2021-06-26 MED ORDER — ACAMPROSATE 333 MG TABLET,DELAYED RELEASE
666.0000 mg | DELAYED_RELEASE_TABLET | Freq: Three times a day (TID) | ORAL | Status: DC
Start: 2021-06-26 — End: 2021-06-27
  Administered 2021-06-26 – 2021-06-27 (×4): 666 mg via ORAL
  Filled 2021-06-26 (×4): qty 2

## 2021-06-26 NOTE — Care Plan (Signed)
Problem: Adult Behavioral Health Plan of Care  Goal: Plan of Care Review  Outcome: Ongoing (see interventions/notes)  Goal: Patient-Specific Goal (Individualization)  Outcome: Ongoing (see interventions/notes)  Goal: Adheres to Safety Considerations for Self and Others  Intervention: Develop and Maintain Individualized Safety Plan  Flowsheets  Taken 06/26/2021 0323  Safety Measures:  . safety rounds completed  . environmental rounds completed  Taken 06/26/2021 1610  Safety Measures: safety rounds completed  Taken 06/26/2021 0226  Safety Measures: safety rounds completed  Taken 06/26/2021 0201  Safety Measures: safety rounds completed  Taken 06/26/2021 0143  Safety Measures: safety rounds completed  Taken 06/26/2021 0123  Safety Measures: safety rounds completed  Taken 06/26/2021 0041  Safety Measures: safety rounds completed  Taken 06/26/2021 0028  Safety Measures: safety rounds completed  Goal: Absence of New-Onset Illness or Injury  Intervention: Identify and Manage Fall Risk  Recent Flowsheet Documentation  Taken 06/26/2021 0323 by Carlean Purl, RN  Safety Measures:  . safety rounds completed  . environmental rounds completed  Taken 06/26/2021 0312 by Carlean Purl, RN  Safety Measures: safety rounds completed  Taken 06/26/2021 0226 by Carlean Purl, RN  Safety Measures: safety rounds completed  Taken 06/26/2021 0201 by Carlean Purl, RN  Safety Measures: safety rounds completed  Taken 06/26/2021 0143 by Carlean Purl, RN  Safety Measures: safety rounds completed  Taken 06/26/2021 0123 by Carlean Purl, RN  Safety Measures: safety rounds completed  Taken 06/26/2021 0041 by Carlean Purl, RN  Safety Measures: safety rounds completed  Taken 06/26/2021 0028 by Carlean Purl, RN  Safety Measures: safety rounds completed   Ativan Taper--160/104--100--Cows=7--Ciwa=6--Patient observed resting in bed with  eyes closed by 2300. Patient awaken at 0300 requesting nicotine lozenges and a bottle of water which were given. Medication compliant with scheduled ativan but continues to refuse clonidine. Q fifteen minute checks maintained for safety. Vmt on unit all shift. Patient in room across from the desk.

## 2021-06-26 NOTE — Progress Notes (Signed)
[facility]   Dual Diagnosis Unit - Inpatient Progress Note    Identification:  Name:  Steven Parrish  Sex assigned at birth: male  Age: 51 y.o.  DOB:  1971/02/08  DOS:  06/27/21  MRN:  [id number]    Chief Concern/Presenting Problems: alcohol detoxification, opioid withdrawal    Subjective:    Per staff report: Remains hypertensive and mildly tachycardic though improving significantly, likely associated with Suboxone treatment. All medications were given as scheduled per [facility], including Suboxone. Regarding PRN medications, none utilized sparing a new request for Voltaren (however medication not available; continues not to utilize ibuprofen). CIWA was 9 -> 2. COWS was 10 -> 3.    Engaged in OT evaluation wherein he stated that he hopes to move to another country to "actually receive good medical care". Did not appear motivated for sobriety and noted rigid thought process. Stated he can go 4 days without sleeping and father with reported history of bipolar disorder. Again stated coping skills are futile. Threatened an individual who could potentially be his transportation. Continues to decline clonidine and demonstrate irritability, though also stating he is anxious regarding returning home. Continues to deny suicidality. Did not engage in groups. Was encouraged to consider Sublocade as well. Fragmented sleep overnight.     On rounds, states he is okay. Regarding Suboxone, states it is too early for him to be sure how this medication is for him, but concedes that "so far, it's a positive". Remains requesting discharge and has arranged a ride for this afternoon. Plan of care and followup reviewed. After hearing Adderall discussed in a previous interview, states he has taken this medication 3 times to benefit of depression, anxiety, and focus. Requests evaluation for ADHD. Counseled on importance of conducting this evaluation in the outpatient setting and when demonstrating adherence with Suboxone  and sobriety. This appeared unsatisfactory to him, and he states that he has requested such an evaluation though been denied. Again inquired about medication for insomnia that was not an antidepressant or melatonin. Discussed that may seek the opinion of sleep medicine--initially countered by saying "that's all the same medicine", and was educated on this misconception. Categorically denies suicidal ideation and has been counseled on risks of AMA discharge. Denies additional concerns or topics of conversation for today.    Objective:    Vitals:    06/26/21 1500 06/26/21 1900 06/26/21 2300 06/27/21 0700   BP: (!) 160/120 (!) 143/115 (!) 148/114 (!) 158/110   Pulse: (!) 113 (!) 115 87 100   Resp: 16 16 16 16    Temp: 36.5 C (97.7 F) 36.2 C (97.2 F) 35.6 C (96.1 F) 36.2 C (97.2 F)   SpO2: 96% 97% 98% 97%   Weight:       Height:       BMI:         Mental Status Exam:  Appearance:  casually dressed, dressed appropriately, well groomed, well-nourished, appears older than actual age, and no apparent distress  Level of Consciousness: alert  Eye Contact:  good   Motor:  no psychomotor slowing or agitation    Speech: rate: slow, rhythm: normal, volume: normal, and quality: garrulous   Behavior:  cooperative though poor social reciprocity.  Orientation: intact  Attention:  good   Memory:  grossly normal  Knowledge: fair and consistent with education--appears to have researched extensively and holds firm beliefs  Thought Process:  linear  Thought Content:  no paranoia or delusions  Perception:  no hallucinations endorsed and not responding to internal stimuli  Insight:  poor.    Judgement:  poor.    Suicidal Ideation:  none.    Homicidal Ideaton:  None   Mood:  frustrated, irritable, tired and complacent, dysthymic  Affect:  congruent to mood, normal range, reactive, slight lability, occasional defiance--facial expresses convey he feels he is not being helpd    ROS: Negative. Any positives noted in subjective.       Physical examination:  -Bilateral wrists (historic sites of cellulitis) without erythema, ecchymoses, lesions, or other skin changes. Dorsal surfaces of both appear chronically raised as corroborated by patient. No apparent active infection.    Current Medications:  Current Facility-Administered Medications   Medication Dose Route Frequency   . acamprosate (CAMPRAL) enteric coated tablet  666 mg Oral 3x/day   . buprenorphine-naloxone (SUBOXONE) 2-0.5mg  per sublingual film  2 Film Sublingual 3x/day   . buprenorphine-naloxone (SUBOXONE) 2-0.5mg  per sublingual film  2 Film Sublingual Daily PRN   . cloNIDine (CATAPRES) tablet  0.1 mg Oral Q4H PRN   . cloNIDine (CATAPRES-TTS) transdermal patch (mg/24 hr)  0.1 mg Transdermal Q7 Days   . diclofenac (VOLTAREN) 1% topical gel  2 g Apply Topically 3x/day   . folic acid (FOLVITE) tablet  1 mg Oral Daily   . ibuprofen (MOTRIN) tablet  400 mg Oral Q6H PRN   . lisinopriL (PRINIVIL) tablet 30 mg  30 mg Oral Daily   . loperamide (IMODIUM) capsule  2 mg Oral Q4H PRN   . LORazepam (ATIVAN) tablet  1 mg Oral Q2H PRN    Or   . LORazepam (ATIVAN) tablet  2 mg Oral Q2H PRN   . LORazepam (ATIVAN) tablet  1 mg Oral Q8HR    Followed by   . [START ON 06/29/2021] LORazepam (ATIVAN) tablet  0.5 mg Oral Q8HR   . multivitamin (THERA) tablet  1 Tablet Oral Daily   . nicotine (NICODERM CQ) transdermal patch (mg/24 hr)  21 mg Transdermal Daily   . nicotine polacrilex (COMMIT) lozenge  2 mg Oral Q1H PRN   . nicotine polacrilex (NICORETTE) chewing gum  2 mg Oral Q1H PRN   . ondansetron (ZOFRAN ODT) rapid dissolve tablet  4 mg Oral Q8H PRN   . thiamine-vitamin B1 tablet  100 mg Oral Daily     Recent labs:  CBC WNL at outside facility  urine drug screen positive for opioids  Labs drawn at this facility as below    BAL: negative    EKG:  Recent Results (from the past 720 hour(s))   ECG 12-LEAD    Collection Time: 06/25/21  9:15 AM   Result Value    Ventricular rate 84    Atrial Rate 84    PR Interval  152    QRS Duration 86    QT Interval 374    QTC Calculation 441    Calculated P Axis 32    Calculated R Axis -14    Calculated T Axis 1    Narrative    Poor data quality, interpretation may be adversely affected  Normal sinus rhythm  Possible Inferior infarct  Abnormal ECG  No previous ECGs available  Preliminary by fellow Ludhwani, Dipesh ([id number]) on 06/25/2021 1:37:13 PM  Confirmed by Brayton Layman ([id number]) on 06/25/2021 4:34:26 PM      5/17: NSR, inferior infarct, QTc appropriate.    Assessment:   Kaedan Richert is a 51 y.o. male PPH AUD, OUD, TUD, and unspecified depressive/anxiety  disorders who presented as a direct admission from the [facility] on 06/25/21 for alcohol detoxification and opioid withdrawal. Reported he has been drinking 2-3 pints of liquor and injecting Dilaudid daily for the past 4 months, with use beginning in his 20's. Although he denies a history of alcohol withdrawal related seizure, he does endorse history of seizures when his prescriptions for oxycodone and gabapentin were abruptly discontinued. He has been started on an Ativan taper and appears to be tolerating well. Although recommended and given psychoeducation on Suboxone, patient is firmly declining MOUD, stating this medication only worsens symptoms. He is however amenable to limited PRN medications to aid with opioid withdrawal symptoms (including PRN Suboxone upon request), though has described aversion to "being thrown pills at" and feeling he is a "[place] pig". He does not feel that psychiatric or psychological treatment are efficacious for him, though concedes his prior strategies to maintain sobriety have been unsuccessful and is willing to consider other treatment options after acute detox. Ultimately requested discharge with PCP followup, though agreed to take Suboxone and Campral in this setting.     Psychiatric diagnoses: AUD - severe; OUD - severe; TUD - severe; unspecified depressive disorder, unspecified anxiety  disorder, PTSD  Medical diagnoses: TBI, HTN, chronic neck and back pain, esophageal varices, HCV, AFLD, allergic rhinitis, hypomagnesemia, IVDU, prior cellulitis  Medications on Admission: none  Medication Trials: Vistaril, Celexa, Remeron, Suboxone, Zoloft, Elavil, Minipress, Campral (somewhat helpful), methadone, oxycodone, tramadol, gabapentin    Plan:  Alcohol use disorder Alcohol withdrawal  - Continue standard 6-day Ativan taper (last dose 5/23 @ 0001)  - Ativan PRN CIWA -  x1 5/17 (likely conflated by elevated COWS in patient declining MOUD)    - Continue MVI, folate, thiamine  - Discuss MAT options including Campral, naltrexone, and Antabuse   Re-initiated Campral  TID 5/18   Patient deferred re-initiation of Antabuse   Deferred re-initiation of naltrexone given concomitant opioid use burden    Opioid use disorder - severe  - Symptomatic opioid withdrawal management medications:  - Clonidine transdermal patch 0.1 mg Q7 days - declined  - Clonidine 0.1 mg Q4HR PRN - declined  - Ibuprofen 400 mg Q6HR PRN - not utilizing  - loperamide 2 mg Q4HR PRN - not utilizing   - Ondansetron 4 mg Q8HR PRN - x2 5/17    -Ordered Suboxone  TID PRN COWS > 8 5/17 - declined after one dose, did not respond to further counseling.  -Revised Suboxone to  TID +  daily PRN COWS > 8 5/18 after patient requested Rx for discharge and agreed to take on the unit in this setting.    Did accept Suboxone, tolerated well, concedes modest improvement.    Tobacco use disorder - severe  - Patient smokes 1PPD  - NRT with 21 mg/24hr patch + PRN lozenges/gum    Unspecified depressive and anxiety disorders  PTSD  -Has declined further psychiatric and psychological treatment due to perceived inefficacy. Contracts to safety on the unit.    HTN  -Continued home lisinopril 30 mg daily  -Rx had lapsed prior to admission, awaiting therapeutic benefit after re-initiation.  -HTN worsening likely in setting of opioid withdrawal for which  patient has declined treatment (though now to accept Suboxone)  -Clonidine also available as above.    Cellulitis (historic)  Reported recent "heart attacks" without intervention  -Several recent ED visits for BUE wrist cellulitis at injection sites. Denies recent indication for treatment. Noninfectious appearing as documented. Will  monitor closely.  -EKG 5/17 with inferior infarct only, NSR, QTc appropriate.  -Maintaining low threshold for cardiac/infectious workup if becomes symptomatic due to recent and recurrent cellulitis with risk of endocarditis.    Chronic neck and back pain  -Patient utilizing opioids for this reason, formerly on gabapentin as well  -Continue to encourage buprenorphine for chronic pain management (in addition to his OUD)  -Now plans to accept Suboxone as above.  -Has PRN medication as part of COWS protocol--not utilizing thus far  -Also utilized Voltaren.    Infectious Disease Screening Abnormal studies  Esophageal varices, HCV, AFLD  - Admission: HCV Ab positive (known), HIV negative  - HBV negative  - Normothrombopenic  - HFT stable  - HAV IGG Ab positive    Psychosocial and Discharge Considerations:  - Continue psychosocial activities and therapy  - Safety planning will be completed prior to discharge  - Prior veteran with VA insurance (and access to [facility] services)  - Patient uncertain of what treatment he desires after acute detox; ultimately elected for PCP followup.  -Has been offered pharmacotherapy for insomnia which he has declined for similar reasons as above. Counseled to seek ADHD evaluation outpatient, while adherent with Suboxone, and when demonstrating sobriety.    DMC: Retains  Restrictions: SR  Disposition: Has requested discharge; AMA risks explained and understood.    Has arranged transportation back to [facility] in [place], where he will continue to engage in care.    Suboxone Rx'd through discharge pharmacy.    Kathlene November/Chris Feghali, MD (he/him)  06/27/2021, 10:53  Behavioral  Medicine & Psychiatry, PGY-2    I saw and examined the patient. I was present and participated in the development of the treatment plan. I reviewed the resident's note. I agree with the findings and plan of care as documented in the resident's note. Any exceptions/additions are edited/noted.     Appropriate for discharge against medical advice (from a withdrawal perspective), as requested. No acute psychiatric safety concerns warranting involuntary commitment. Patient voices understanding of risks associated with leaving prior to completion of her taper including but not limited to delirium, seizures, and possible death. Patient given return precautions and has demonstrated help-seeking behavior historically. Categorically denies SI/HI/AVH.     Judeth Porchorothy van Oppen, MD 06/27/2021 13:23  Attending Physician

## 2021-06-26 NOTE — Care Plan (Signed)
[facility]  Occupational Therapy Initial Evaluation    Patient Name: Steven Parrish  Date of Birth: 04-Sep-1970  Height: Height: 180.3 cm (5\' 11" )  Weight: Weight: 95.7 kg (211 lb)  Room/Bed: H253/A  Payor: HE[employer] MEDICAID / Plan: HEALTH PLAN MEDICAID / Product Type: Medicaid [facility] /     Assessment:   Pt participated fairly in OT Evaluation this date. Pt stated his goal is to "win a lot of money, then move to another country so I can actually receive good medical care." OT encouraged pt to consider the probability of this and focus on developing short term goals. Pt then states he will likely "go home, keep doing the same thing, and eventually not wake up". Pt denies desire to be sober from opioids. Demonstrates rigid thinking and is not receptive to education. OT encouraged pt to just think about treatment team's recommendations and go from there. Pt agreeable to this. Patient demonstrates deficits in insight/judgment, readiness to change, daily routines, social support, coping skills, social/leisure participation, and pain management. Pt would benefit from continued individual/group Occupational Therapy services in this setting.     Group Appropriate: Yes      Discharge Needs:      Discharge Disposition: home     Plan:   Current Intervention: cognitive retraining    To provide Occupational therapy services 1x/day, minimum of 1x/week, until discharge.       The risks/benefits of therapy have been discussed with the patient/caregiver and he/she is in agreement with the established plan of care.       Subjective & Objective        06/26/21 1003   Occupational therapy Focus    Therapy Focus  Mental Health   Therapist Pager   OT Assigned/ Pager # 06/28/21 [phone]   Rehab Session   Document Type evaluation   Total OT Minutes: 13   General Information   Patient Profile Reviewed yes   Pertinent History of Current Functional Problem 51 y.o. male PPH AUD, OUD, TUD, and unspecified  depressive/anxiety disorders who presented as a direct admission from the [facility] on 06[facility]25/21 for alcohol detoxification and opioid withdrawal. Reported he has been drinking 2-3 pints of liquor and injecting Dilaudid daily for the past 4 months, with use beginning in his 20's. Although he denies a history of alcohol withdrawal related seizure, he does endorse history of seizures when his prescriptions for oxycodone and gabapentin were abruptly discontinued. He has been started on an Ativan taper and appears to be tolerating well. Although recommended and given psychoeducation on Suboxone, patient is firmly declining MOUD. He is however amenable to PRN medications to aid with opioid withdrawal symptoms (including PRN Suboxone upon request). He does not feel that psychiatric or psychological treatment are efficacious for him, though concedes his prior strategies to maintain sobriety have been unsuccessful and is willing to consider other treatment options after acute detox.   Living Environment   Lives With parent(s)   Living Arrangements mobile home   Living Environment Comment Pt lives with dad and states environment is sometimes supportive; per pt, father is "bipolar", has frequent anger outbursts, and has been demonstrating memory deficits recently.   Daily Routine States daily routine is "getting out and hustling to make money" to pay for dilaudid. Also reports having very poor sleep routine where he goes up to 4 days without sleeping and this occurs approximately once a week   Self-Care   Coping Skills/Leisure  Pt reports only medication-related coping strategies; states all other coping skills do not work and he has "tried everything".   Family/Support System   Family Support System poor support system; dad is minimally supportive   Cognitive Assessment/Interventions   Orientation Status oriented x 4   Evaluation   General  Appearance  casually dressed;no apparent distress;well groomed   Eye contact Good    Motor/MS normal gait   Speech/Verbal Response perservative;pressured   Memory grossly normal   Mood irritable;hopeless   Affect consistent with mood   Perception normal   Thought Process circumstantial   Thought Content persecutory;somatic   Suicidal Ideation other (see comment)  (Pt voices thoughts of "not waking up someday")   Homicidal Ideation none   Level of Consciousness  alert   Judgement poor   Conceptual abstract thinking   Cognitive Assessment  WFL/WNL   Care Plan Goals   OT Rehab Goals Cognition Goal   Cognition Goal   Cognition Goal, Time to Achieve by discharge   Cognition Goal, Activity Type Pt will identify 3 positive coping strategies to incorporate into daily life.  Pt will follow a daily routine by completing at least 3 scheduled activities.   Pt will integrate sleep hygiene into daily routine with 100% consistency.  Pt will demonstrate improved social interaction skills through participation in OT group.   Pt will identify 5+ sensory strategies to implement into daily life and improve emotional regulation.   Planned Therapy Interventions, OT Eval   Planned Therapy Interventions cognitive retraining   Clinical Impression   Therapy Frequency 1x/day;minimum of 1x/week   Predicted Duration of Therapy until discharge   Anticipated Discharge Disposition home   Education/ Handouts OT offered pt sensory modulation handout, however, pt refused any education attempts stating "nothing works"   Evaluation Complexity Justification   Evaluation Complexity Low         Therapist:   Betsey Holiday, OT   Pager [phone]

## 2021-06-26 NOTE — Care Plan (Signed)
Pt observed spending the majority of the shift sleeping and/or isolative to his room.  Pt met with this Probation officer for a 1:1 session, but he did not attend any unit programming today.  He reports wanting to leave ideally either tomorrow or Saturday, though transportation may be an issue.  As of this afternoon, he is amenable to continuing to take Suboxone up to and following discharge.  Writer briefly discussed with him how Sublocade would likely be an efficacious future option for addressing chronic pain and OUD; noted how currently maintaining compliance with Suboxone is important for various reasons.  Pt's mood was depressed/anxious/irritable and affect was consistent with mood.  Plan is to continue to encourage pt to attend unit programming until discharge.    Camelia Eng, CT  06/26/2021, 17:15

## 2021-06-26 NOTE — Care Plan (Addendum)
Pt compliant with meds. Mouth check and safety checks complete. BP elevated. Offered clonidine, refused. Pt very irritable. Pacing halls and states his anxiety about going home. Patient states no needs or concern at this time. Denies SI/HI/AVH. Able to make needs known.      BP (Non-Invasive): (!) 190/114 Temperature: 36.1 C (97 F) Heart Rate: (!) 110 Respiratory Rate: 16 SpO2: 96 %  Height: 180.3 cm (5\' 11" ) Weight: 95.7 kg (211 lb) Body mass index is 29.43 kg/m.    , RN    Problem: Adult Behavioral Health Plan of Care  Goal: Patient-Specific Goal (Individualization)  Outcome: Ongoing (see interventions/notes)  Flowsheets (Taken 06/26/2021 1401)  Individualized Care Needs: Detox from opiates  Anxieties, Fears or Concerns:  . Agreed to take suboxone  . Generally irritable  Patient-Specific Goals (Include Timeframe): Pt will attend groups once physically able to do so.     Problem: Excessive Substance Use  Goal: Optimized Energy Level (Excessive Substance Use)  Outcome: Ongoing (see interventions/notes)  Goal: Improved Behavioral Control (Excessive Substance Use)  Outcome: Ongoing (see interventions/notes)

## 2021-06-26 NOTE — Progress Notes (Signed)
[facility]   Dual Diagnosis Unit - Inpatient Progress Note    Identification:  Name:  Steven Parrish  Sex assigned at birth: male  Age: 51 y.o.  DOB:  1970/03/01  DOS:  06/26/21  MRN:  [id number]    Chief Concern/Presenting Problems: alcohol detoxification, opioid withdrawal    Subjective:    Per staff report: Now with significant hypertension likely due to OUD for which patient has declined MOUD, and while awaiting lisinopril efficacy. All medications were given as scheduled per [facility] sparing scheduled clonidine patch which was declined. Regarding PRN medications, declined further Suboxone after a single 2mg  PRN, again citing that this medication worsens symptoms. Declined PRN clonidine tablet as well (felt that clonidine was consistent with "being thrown pills at" and that he is a "[place] pig". Did accept Zofran and NRT. Not utilizing any ibuprofen for pain. Received Ativan 2mg  x1 from CIWA. CIWA was 9 -> 6. COWS was 14 -> 7.     Continues to deny suicidality or psychotic symptoms. Did not attend groups. In individual therapy session, expressed slight interest in consider for rehabilitation. Describes himself as "antisocial" which leads him to "stay away from the crowds and their problems". This was processed. Called his mother last night and read. Appeared to sleep well overnight sparing an awakening at 0300 for NRT and water.    Per discussion with staff, patient has raised topic of former efficacy and tolerability of Campral and Antabuse. Remained pre-contemplative regarding Suboxone though engaged in discussion of this and potential rehabilitation.    On rounds, states he is "here". When inquired as to why he felt this way, recounted his ongoing withdrawal and chronic pain symptoms. Expressed his frustration regarding previous care, perceived overselling of treatment efficacy, and feeling misheard. He appeared to project these feelings onto the current treatment team. When he asked team  how we would respond to this, re-counseled on importance of Suboxone titration in setting of inefficacy and perceived exacerbation of symptoms at low dose. He continued to challenge this forcefully, and stated that his PRN order would "stay there, because I'm not going to take it". Corroborated above historic efficacy of MAT. Regarding disposition, states "I'm fucked". Continued to discuss use pattern and his perception of limited treatment options, and blamed former president [employer]ack Obama for this. He then requested pharmacotherapy for insomnia but declined all offered options (atypical antidepressants, melatonin) citing similar pattern of exacerbation of symptoms and inefficacy. Discussed that he appeared to hold a firm belief system that was difficult to engage with, though that he also appeared to have developed a level of insight associated with this over his life. Denies additional concerns or topics of conversation for today. Left group rounds after his interview.    -    After rounds, patient discussed with therapist that he is now requesting discharge. Stated he would accept Suboxone in this setting, and [place] [facility] [place]. Declined rehabilitation.    Assessed patient on the unit who corroborated the above. Risks of AMA discharge including lethality were discussed. He understands the nature of his Ativan taper and does not feel that alcohol withdrawal is problematic for him. Informed patient that would not prescribe Suboxone unless taken and monitored on the unit. After consideration, he agreed to this, and to revision of PRN schedule to TID. He states he has refills of Campral at home and plans to take these; as such, agreed to take medication on the unit  as well. Understands that if he takes Suboxone and tolerates it well, will prescribe enough to reach next appointment. Continues to deny interest in rehabilitation or continued admission. Devoid of suicidality.    Objective:     Vitals:    06/25/21 2100 06/25/21 2300 06/26/21 0700 06/26/21 1043   BP: (!) 164/109 (!) 160/124 (!) 162/113 (!) 190/114   Pulse: (!) 109 100 (!) 106 (!) 110   Resp: 17 18 16 16    Temp:  36.6 C (97.8 F) 36.1 C (97 F) 36.1 C (97 F)   SpO2:  98% 96% 96%   Weight:       Height:       BMI:         Mental Status Exam:  Appearance:  casually dressed, dressed appropriately, well groomed, well-nourished, appears older than actual age, and no apparent distress  Level of Consciousness: alert  Eye Contact:  good   Motor:  no psychomotor slowing or agitation    Speech: rate: slow, rhythm: normal, volume: loud, and quality: garrulous   Behavior:  cooperative though poor social reciprocity. Defiant.  Orientation: intact  Attention:  good   Memory:  grossly normal  Knowledge: fair and consistent with education--appears to have researched extensively and holds firm beliefs  Thought Process:  linear  Thought Content:  no paranoia or delusions  Perception:  no hallucinations endorsed and not responding to internal stimuli  Insight:  fair.    Judgement:  poor.    Suicidal Ideation:  none.    Homicidal Ideaton:  None   Mood:  frustrated, irritable, tired and complacent  Affect:  congruent to mood, normal range, reactive, slight lability, defiant.    ROS: Negative. Any positives noted in subjective.      Physical examination:  -Bilateral wrists (historic sites of cellulitis) without erythema, ecchymoses, lesions, or other skin changes. Dorsal surfaces of both appear chronically raised as corroborated by patient. No apparent active infection.    Current Medications:  Current Facility-Administered Medications   Medication Dose Route Frequency   . buprenorphine-naloxone (SUBOXONE) 2-0.5mg  per sublingual film  1 Film Sublingual 3x/day PRN   . cloNIDine (CATAPRES) tablet  0.1 mg Oral Q4H PRN   . cloNIDine (CATAPRES-TTS) transdermal patch (mg/24 hr)  0.1 mg Transdermal Q7 Days   . folic acid (FOLVITE) tablet  1 mg Oral Daily   .  ibuprofen (MOTRIN) tablet  400 mg Oral Q6H PRN   . lisinopriL (PRINIVIL) tablet 30 mg  30 mg Oral Daily   . loperamide (IMODIUM) capsule  2 mg Oral Q4H PRN   . LORazepam (ATIVAN) tablet  1 mg Oral Q2H PRN    Or   . LORazepam (ATIVAN) tablet  2 mg Oral Q2H PRN   . LORazepam (ATIVAN) tablet  2 mg Oral Q8HR    Followed by   . [START ON 06/27/2021] LORazepam (ATIVAN) tablet  1 mg Oral Q8HR    Followed by   . [START ON 06/29/2021] LORazepam (ATIVAN) tablet  0.5 mg Oral Q8HR   . multivitamin (THERA) tablet  1 Tablet Oral Daily   . nicotine (NICODERM CQ) transdermal patch (mg/24 hr)  21 mg Transdermal Daily   . nicotine polacrilex (COMMIT) lozenge  2 mg Oral Q1H PRN   . nicotine polacrilex (NICORETTE) chewing gum  2 mg Oral Q1H PRN   . ondansetron (ZOFRAN ODT) rapid dissolve tablet  4 mg Oral Q8H PRN   . thiamine-vitamin B1 tablet  100 mg Oral Daily  Recent labs:  CBC WNL at outside facility  urine drug screen positive for opioids  Labs drawn at this facility as below    BAL: negative    EKG:  Recent Results (from the past 720 hour(s))   ECG 12-LEAD    Collection Time: 06/25/21  9:15 AM   Result Value    Ventricular rate 84    Atrial Rate 84    PR Interval 152    QRS Duration 86    QT Interval 374    QTC Calculation 441    Calculated P Axis 32    Calculated R Axis -14    Calculated T Axis 1    Narrative    Poor data quality, interpretation may be adversely affected  Normal sinus rhythm  Possible Inferior infarct  Abnormal ECG  No previous ECGs available  Preliminary by fellow Ludhwani, Dipesh ([id number]) on 06/25/2021 1:37:13 PM  Confirmed by Brayton Layman ([id number]) on 06/25/2021 4:34:26 PM      5/17: NSR, inferior infarct, QTc appropriate.    Assessment:   Steven Parrish is a 51 y.o. male PPH AUD, OUD, TUD, and unspecified depressive/anxiety disorders who presented as a direct admission from the [place] on 06/25/21 for alcohol detoxification and opioid withdrawal. Reported he has been drinking 2-3 pints of liquor and  injecting Dilaudid daily for the past 4 months, with use beginning in his 20's. Although he denies a history of alcohol withdrawal related seizure, he does endorse history of seizures when his prescriptions for oxycodone and gabapentin were abruptly discontinued. He has been started on an Ativan taper and appears to be tolerating well. Although recommended and given psychoeducation on Suboxone, patient is firmly declining MOUD, stating this medication only worsens symptoms. He is however amenable to limited PRN medications to aid with opioid withdrawal symptoms (including PRN Suboxone upon request), though has described aversion to "being thrown pills at" and feeling he is a "[place] pig". He does not feel that psychiatric or psychological treatment are efficacious for him, though concedes his prior strategies to maintain sobriety have been unsuccessful and is willing to consider other treatment options after acute detox. Ultimately requested discharge with PCP followup, though agreed to take Suboxone and Campral in this setting.     Psychiatric diagnoses: AUD - severe; OUD - severe; TUD - severe; unspecified depressive disorder, unspecified anxiety disorder, PTSD  Medical diagnoses: TBI, HTN, chronic neck and back pain, esophageal varices, HCV, AFLD, allergic rhinitis, hypomagnesemia, IVDU, prior cellulitis  Medications on Admission: none  Medication Trials: Vistaril, Celexa, Remeron, Suboxone, Zoloft, Elavil, Minipress, Campral (somewhat helpful), methadone, oxycodone, tramadol, gabapentin    Plan:  Alcohol use disorder Alcohol withdrawal  - Continue standard 6-day Ativan taper (last dose 5/23 @ 0001)  - Ativan PRN CIWA - 2mg  x1 5/17 (likely conflated by elevated COWS in patient declining MOUD)    - Continue MVI, folate, thiamine  - Discuss MAT options including Campral, naltrexone, and Antabuse   Re-initiated Campral 666mg  TID 5/18   Patient deferred re-initiation of Antabuse   Deferred re-initiation of  naltrexone given concomitant opioid use burden    Opioid use disorder - severe  - Symptomatic opioid withdrawal management medications:  - Clonidine transdermal patch 0.1 mg Q7 days - declined  - Clonidine 0.1 mg Q4HR PRN - declined  - Ibuprofen 400 mg Q6HR PRN - not utilizing  - loperamide 2 mg Q4HR PRN - not utilizing   - Ondansetron 4 mg Q8HR PRN - x2 5/17    -  Ordered Suboxone  TID PRN COWS > 8 5/17 - declined after one dose, did not respond to further counseling.  -Revised Suboxone to  TID +  daily PRN COWS > 8 5/18 after patient requested Rx for discharge and agreed to take on the unit in this setting.     Tobacco use disorder - severe  - Patient smokes 1PPD  - NRT with 21 mg/24hr patch + PRN lozenges/gum    Unspecified depressive and anxiety disorders  PTSD  -Has declined further psychiatric and psychological treatment due to perceived inefficacy. Contracts to safety on the unit.    HTN  -Continued home lisinopril 30 mg daily  -Rx had lapsed prior to admission, awaiting therapeutic benefit after re-initiation.  -HTN worsening likely in setting of opioid withdrawal for which patient has declined treatment (though now to accept Suboxone)  -Clonidine also available as above.    Cellulitis (historic)  Reported recent "heart attacks" without intervention  -Several recent ED visits for BUE wrist cellulitis at injection sites. Denies recent indication for treatment. Noninfectious appearing as documented. Will monitor closely.  -EKG 5/17 with inferior infarct only, NSR, QTc appropriate.  -Maintaining low threshold for cardiac/infectious workup if becomes symptomatic due to recent and recurrent cellulitis with risk of endocarditis.    Chronic neck and back pain  -Patient utilizing opioids for this reason, formerly on gabapentin as well  -Continue to encourage buprenorphine for chronic pain management (in addition to his OUD)  -Now plans to accept Suboxone as above.  -Has PRN medication as part of COWS  protocol--not utilizing thus far    Infectious Disease Screening Abnormal studies  Esophageal varices, HCV, AFLD  - Admission: HCV Ab positive (known), HIV negative  - HBV negative  - Normothrombopenic  - HFT stable  - HAV IGG Ab positive    Psychosocial and Discharge Considerations:  - Continue psychosocial activities and therapy  - Therapist to speak with collateral as warranted  - Safety planning will be completed prior to discharge  - Prior veteran with VA insurance (and access to [place] services)  - Patient uncertain of what treatment he desires after acute detox; ultimately elected for PCP followup.    DMC: Retains  Restrictions: SR  Disposition: ongoing medication optimization and medical stabilization. Requesting discharge as of 5/18; AMA risks explained and understood. Will begin to arrange transportation back to [place] in [place].     Kathlene November, MD (he/him)  06/26/2021, 07:46  Behavioral Medicine & Psychiatry, PGY-2    I saw and examined the patient. I was present and participated in the development of the treatment plan. I reviewed the resident's note. I agree with the findings and plan of care as documented in the resident's note. Any exceptions/additions are edited/noted.     Pt very resistant to Suboxone for OUD and pain management despite psychoeducation. Respecting patient autonomy while still letting him know what's available.    Judeth Porch, MD 06/26/2021 16:05  Attending Physician

## 2021-06-26 NOTE — Behavioral Health (Signed)
Patient came to the nurses station and said "If that one bitch comes from the [place] to pick me up I am not going with her"  Patient then said she wouldn't make it home if she came to pick him up.  [facility] said we would not tolerate threats like that. Further clarified that he was referring to "Melissa".  Patient said he would find another way home .  Discharge planner awaiting a call back from the [place] transport.

## 2021-06-27 ENCOUNTER — Other Ambulatory Visit: Payer: Self-pay

## 2021-06-27 DIAGNOSIS — F32A Depression, unspecified: Secondary | ICD-10-CM

## 2021-06-27 DIAGNOSIS — F4310 Post-traumatic stress disorder, unspecified: Secondary | ICD-10-CM

## 2021-06-27 LAB — HEPATITIS C VIRUS (HCV) RNA DETECTION AND QUANTIFICATION, PCR, PLASMA: HCV QUANTITATIVE PCR: NOT DETECTED

## 2021-06-27 MED ORDER — FOLIC ACID 1 MG TABLET
1.0000 mg | ORAL_TABLET | Freq: Every day | ORAL | 0 refills | Status: DC
Start: 2021-06-27 — End: 2021-07-12

## 2021-06-27 MED ORDER — BUPRENORPHINE 2 MG-NALOXONE 0.5 MG SUBLINGUAL FILM
2.0000 | ORAL_FILM | Freq: Every day | SUBLINGUAL | Status: DC | PRN
Start: 2021-06-27 — End: 2021-06-27

## 2021-06-27 MED ORDER — BUPRENORPHINE 8 MG-NALOXONE 2 MG SUBLINGUAL FILM
1.0000 | ORAL_FILM | SUBLINGUAL | Status: AC
Start: 2021-06-27 — End: 2021-06-27
  Administered 2021-06-27: 1 via SUBLINGUAL
  Filled 2021-06-27: qty 1

## 2021-06-27 MED ORDER — BUPRENORPHINE 8 MG-NALOXONE 2 MG SUBLINGUAL FILM
0.5000 | ORAL_FILM | Freq: Three times a day (TID) | SUBLINGUAL | 0 refills | Status: AC
Start: 2021-06-27 — End: 2021-07-03
  Filled 2021-06-27: qty 8, 6d supply, fill #0

## 2021-06-27 MED ORDER — MULTIVITAMIN WITH FOLIC ACID 400 MCG TABLET
1.0000 | ORAL_TABLET | Freq: Every day | ORAL | 0 refills | Status: DC
Start: 2021-06-27 — End: 2021-08-02

## 2021-06-27 MED ORDER — ACAMPROSATE 333 MG TABLET,DELAYED RELEASE
666.0000 mg | DELAYED_RELEASE_TABLET | Freq: Three times a day (TID) | ORAL | 0 refills | Status: DC
Start: 2021-06-27 — End: 2021-09-07

## 2021-06-27 MED ORDER — DICLOFENAC 1 % TOPICAL GEL
2.0000 g | Freq: Three times a day (TID) | CUTANEOUS | 0 refills | Status: DC | PRN
Start: 2021-06-27 — End: 2021-11-29

## 2021-06-27 MED ORDER — LISINOPRIL 30 MG TABLET
30.0000 mg | ORAL_TABLET | Freq: Every day | ORAL | 0 refills | Status: DC
Start: 2021-06-27 — End: 2022-10-15

## 2021-06-27 MED ORDER — THIAMINE MONONITRATE (VITAMIN B1) 100 MG TABLET
100.0000 mg | ORAL_TABLET | Freq: Every day | ORAL | 0 refills | Status: DC
Start: 2021-06-27 — End: 2021-08-02

## 2021-06-27 NOTE — Discharge Instructions (Addendum)
Please send patient with online meeting information placed in front of chart.    ADMISSION INFORMATION     Patient Name: Steven Parrish  Date of Birth:  1970-05-17    Admission Date:  06/25/2021  Discharge Date: 06/27/2021    Attending Physician: Judeth Porch, MD  Primary Care Physician: No Pcp     Reason for Admission/Precipitating event: Alcohol use with withdrawal (CMS [facility]) [F10.939]    APPOINTMENTS     MED MANAGEMENT appt 06/30/21 @ 10am [place]  Will need to provide UDS  Dr. Randa Evens had made a referral to their team at the [facility] for Suboxone.   [id number] @ 2:00pm via phone you have appointment      CRISIS RESOURCES      Addiction Resources:  Narcotics Anonymous [facility] [place]): For more information or to talk to a member of Narcotics Anonymous call the HELP LINE 541-610-0660 or [facility] (local) 214-479-5100. Internet: www.mrscna.org or World Service Office: MobileTransition.ch.  Alcoholics Anonymous [facility] [place]): 24 Hour Toll-Free Hotline 339-195-8477. Internet: VIPDealers.com.au.  Also Visit Help4[place].com or call or text 703-198-9711 to get a list of  NA/AA or Smart Recovery Meetings. This source can also provided you with phone numbers to longer term treatment if you desire to go post hospital discharge.    Local Crisis Number:    [place]:1-800-545-HELP / 6142316038  ______________________________________________________________  Help 4 [place] Crisis Line  1 425-365-4534     Phone number for anyone in crisis, including those who are suicidal.      CRISIS TEXT LINE  Confidential, Free, 24 hours a day 7 days a week.  Text "START" to 856-081-9465, someone will text you back.  ________________________________________________________  [facility] Suicide Prevention Lifeline Suicide and Crisis Lifeline- [address] of Mental Health 463-109-2800 http://www.maynard.net/  National Alliance on Mental Illness (431)886-0625 www.nami.Gerre Scull  [facility]ental [facility] [place] (579)876-3006 www.nmha.org  National Suicide Hotline  5042859955 (800-SUICIDE)    VITALS      BP Readings from Last 1 Encounters:   06/27/21 (!) 158/110     Wt Readings from Last 1 Encounters:   06/25/21 95.7 kg (211 lb)     Ht Readings from Last 1 Encounters:   06/25/21 1.803 m (5\' 11" )     Body mass index is 29.43 kg/m.    LAB TESTING      No results found for: HA1C  No results found for: GLUCOSEFAST  No results found for: CHOLESTEROL, HDL, LDLCHOL, LDLCHOLDIR, TRIG  Lab Results   Component Value Date/Time    ALBUMIN 3.7 06/25/2021 06:54 AM    TOTALPROTEIN 7.6 06/25/2021 06:54 AM    ALKPHOS 176 (H) 06/25/2021 06:54 AM      Lab Results   Component Value Date/Time    AST 39 06/25/2021 06:54 AM    ALT 32 06/25/2021 06:54 AM    BILIRUBINCON 0.3 06/25/2021 06:54 AM         Referring providers can utilize [email or web address] to access their referred [facility] patient's information or call [phone] to contact this provider.

## 2021-06-27 NOTE — Nurses Notes (Signed)
Patient discharged home with family.  AVS reviewed with patient/care giver.  A written copy of the AVS and discharge instructions was given to the patient/care giver.  Questions sufficiently answered as needed.  Patient/care giver encouraged to follow up with PCP as indicated.  In the event of an emergency, patient/care giver instructed to call 911 or go to the nearest emergency room.     [place] Delauder, RN

## 2021-06-27 NOTE — Care Plan (Signed)
Problem: Adult Behavioral Health Plan of Care  Goal: Plan of Care Review  Outcome: Ongoing (see interventions/notes)  Goal: Patient-Specific Goal (Individualization)  Outcome: Ongoing (see interventions/notes)  Goal: Adheres to Safety Considerations for Self and Others  Intervention: Develop and Maintain Individualized Safety Plan  Flowsheets  Taken 06/27/2021 0425  Safety Measures:  . safety rounds completed  . environmental rounds completed  Taken 06/27/2021 0341  Safety Measures: safety rounds completed  Taken 06/27/2021 0325  Safety Measures: safety rounds completed  Taken 06/27/2021 4888  Safety Measures: safety rounds completed  Taken 06/27/2021 0230  Safety Measures: safety rounds completed  Taken 06/27/2021 0212  Safety Measures: safety rounds completed  Taken 06/27/2021 0154  Safety Measures: safety rounds completed  Taken 06/27/2021 0125  Safety Measures: safety rounds completed  Taken 06/27/2021 0108  Safety Measures: safety rounds completed  Taken 06/27/2021 0038  Safety Measures: safety rounds completed  Taken 06/27/2021 0025  Safety Measures: safety rounds completed  Goal: Absence of New-Onset Illness or Injury  Intervention: Identify and Manage Fall Risk  Recent Flowsheet Documentation  Taken 06/27/2021 0425 by Carlean Purl, RN  Safety Measures:  . safety rounds completed  . environmental rounds completed  Taken 06/27/2021 0341 by Carlean Purl, RN  Safety Measures: safety rounds completed  Taken 06/27/2021 0325 by Carlean Purl, RN  Safety Measures: safety rounds completed  Taken 06/27/2021 0311 by Carlean Purl, RN  Safety Measures: safety rounds completed  Taken 06/27/2021 0230 by Carlean Purl, RN  Safety Measures: safety rounds completed  Taken 06/27/2021 0212 by Carlean Purl, RN  Safety Measures: safety rounds completed  Taken 06/27/2021 0154 by Carlean Purl, RN  Safety Measures: safety rounds  completed  Taken 06/27/2021 0125 by Carlean Purl, RN  Safety Measures: safety rounds completed  Taken 06/27/2021 0108 by Carlean Purl, RN  Safety Measures: safety rounds completed  Taken 06/27/2021 0038 by Carlean Purl, RN  Safety Measures: safety rounds completed  Taken 06/27/2021 0025 by Carlean Purl, RN  Safety Measures: safety rounds completed   Ativan Taper--148/114--87--Cows=3--Ciwa=2--Patient observed resting in bed with eyes closed by 2330. Patient out in the hall a couple of times through the night. Sleep appeared to be fragmented. Fifteen minute checks maintained for safety. Vmt on unit all shift.

## 2021-07-12 ENCOUNTER — Encounter (HOSPITAL_BASED_OUTPATIENT_CLINIC_OR_DEPARTMENT_OTHER): Payer: Self-pay

## 2021-07-12 ENCOUNTER — Emergency Department
Admission: EM | Admit: 2021-07-12 | Discharge: 2021-07-12 | Disposition: A | Discharge status: Home / Self Care | Payer: MEDICAID | Attending: Family | Admitting: Family

## 2021-07-12 ENCOUNTER — Other Ambulatory Visit: Payer: Self-pay

## 2021-07-12 DIAGNOSIS — K2970 Gastritis, unspecified, without bleeding: Secondary | ICD-10-CM | POA: Insufficient documentation

## 2021-07-12 DIAGNOSIS — F1090 Alcohol use, unspecified, uncomplicated: Secondary | ICD-10-CM | POA: Insufficient documentation

## 2021-07-12 DIAGNOSIS — Z6828 Body mass index (BMI) 28.0-28.9, adult: Secondary | ICD-10-CM

## 2021-07-12 DIAGNOSIS — R1110 Vomiting, unspecified: Secondary | ICD-10-CM

## 2021-07-12 HISTORY — DX: Esophageal varices without bleeding (CMS HCC): I85.00

## 2021-07-12 HISTORY — DX: Gastric varices: I86.4

## 2021-07-12 LAB — COMPREHENSIVE METABOLIC PANEL, NON-FASTING
ALBUMIN/GLOBULIN RATIO: 0.9 (ref 0.8–1.4)
ALBUMIN: 3.9 g/dL (ref 3.4–5.0)
ALKALINE PHOSPHATASE: 259 U/L — ABNORMAL HIGH (ref 46–116)
ALT (SGPT): 91 U/L — ABNORMAL HIGH (ref <=78)
ANION GAP: 12 mmol/L (ref 10–20)
AST (SGOT): 103 U/L — ABNORMAL HIGH (ref 15–37)
BILIRUBIN TOTAL: 0.5 mg/dL (ref 0.2–1.0)
BUN/CREA RATIO: 7
BUN: 11 mg/dL (ref 7–18)
CALCIUM, CORRECTED: 8.8 mg/dL
CALCIUM: 8.7 mg/dL (ref 8.5–10.1)
CHLORIDE: 101 mmol/L (ref 98–107)
CO2 TOTAL: 24 mmol/L (ref 21–32)
CREATININE: 1.47 mg/dL — ABNORMAL HIGH (ref 0.70–1.30)
ESTIMATED GFR: 58 mL/min/{1.73_m2} — ABNORMAL LOW (ref >59)
GLOBULIN: 4.3
GLUCOSE: 117 mg/dL — ABNORMAL HIGH (ref 74–106)
OSMOLALITY, CALCULATED: 274 mOsm/kg (ref 270–290)
POTASSIUM: 3.7 mmol/L (ref 3.5–5.1)
PROTEIN TOTAL: 8.2 g/dL (ref 6.4–8.2)
SODIUM: 137 mmol/L (ref 136–145)

## 2021-07-12 LAB — CBC WITH DIFF
BASOPHIL #: 0.02 10*3/uL (ref 0.00–0.30)
BASOPHIL %: 0 % (ref 0–3)
EOSINOPHIL #: 0.24 10*3/uL (ref 0.00–0.80)
EOSINOPHIL %: 3 % (ref 0–7)
HCT: 40.8 % — ABNORMAL LOW (ref 42.0–51.0)
HGB: 13.3 g/dL — ABNORMAL LOW (ref 13.5–18.0)
LYMPHOCYTE #: 2.67 10*3/uL (ref 1.10–5.00)
LYMPHOCYTE %: 37 % (ref 25–45)
MCH: 30 pg (ref 27.0–32.0)
MCHC: 32.5 g/dL (ref 32.0–36.0)
MCV: 92.2 fL (ref 78.0–99.0)
MONOCYTE #: 0.56 10*3/uL (ref 0.00–1.30)
MONOCYTE %: 8 % (ref 0–12)
MPV: 7.8 fL (ref 7.4–10.4)
NEUTROPHIL #: 3.8 10*3/uL (ref 1.80–8.40)
NEUTROPHIL %: 52 % (ref 40–76)
PLATELETS: 271 10*3/uL (ref 140–440)
RBC: 4.42 10*6/uL (ref 4.20–6.00)
RDW: 17.2 % — ABNORMAL HIGH (ref 11.6–14.8)
WBC: 7.3 10*3/uL (ref 4.0–10.5)

## 2021-07-12 LAB — MAGNESIUM: MAGNESIUM: 1.7 mg/dL — ABNORMAL LOW (ref 1.8–2.4)

## 2021-07-12 LAB — PT/INR
INR: 1.08 (ref 0.88–1.10)
PROTHROMBIN TIME: 11.4 seconds (ref 9.8–12.7)

## 2021-07-12 LAB — ETHANOL, SERUM: ETHANOL: 80 mg/dL — ABNORMAL HIGH (ref <=3)

## 2021-07-12 LAB — PTT (PARTIAL THROMBOPLASTIN TIME): APTT: 35.1 seconds — ABNORMAL HIGH (ref 22.4–31.7)

## 2021-07-12 LAB — LIPASE: LIPASE: 89 U/L (ref 73–393)

## 2021-07-12 MED ORDER — PANTOPRAZOLE 40 MG INTRAVENOUS SOLUTION
80.0000 mg | INTRAVENOUS | Status: AC
Start: 2021-07-12 — End: 2021-07-12
  Administered 2021-07-12: 80 mg via INTRAVENOUS

## 2021-07-12 MED ORDER — MAGNESIUM SULFATE 1 GRAM/100 ML IN DEXTROSE 5 % INTRAVENOUS PIGGYBACK
1.0000 g | INJECTION | Freq: Once | INTRAVENOUS | Status: AC
Start: 2021-07-13 — End: 2021-07-12
  Administered 2021-07-12: 0 g via INTRAVENOUS
  Administered 2021-07-12: 1 g via INTRAVENOUS

## 2021-07-12 MED ORDER — MAGNESIUM SULFATE 1 GRAM/100 ML IN DEXTROSE 5 % INTRAVENOUS PIGGYBACK
INJECTION | INTRAVENOUS | Status: AC
Start: 2021-07-12 — End: 2021-07-12
  Filled 2021-07-12: qty 100

## 2021-07-12 MED ORDER — PANTOPRAZOLE 40 MG INTRAVENOUS SOLUTION
INTRAVENOUS | Status: AC
Start: 2021-07-12 — End: 2021-07-12
  Filled 2021-07-12: qty 20

## 2021-07-12 MED ORDER — ONDANSETRON 4 MG DISINTEGRATING TABLET
4.0000 mg | ORAL_TABLET | Freq: Three times a day (TID) | ORAL | 0 refills | Status: DC | PRN
Start: 2021-07-12 — End: 2021-08-02

## 2021-07-12 MED ORDER — SODIUM CHLORIDE 0.9 % (FLUSH) INJECTION SYRINGE
3.0000 mL | INJECTION | INTRAMUSCULAR | Status: DC | PRN
Start: 2021-07-12 — End: 2021-07-13

## 2021-07-12 MED ORDER — SODIUM CHLORIDE 0.9 % IV BOLUS
1000.0000 mL | INJECTION | Status: AC
Start: 2021-07-12 — End: 2021-07-12
  Administered 2021-07-12: 0 mL via INTRAVENOUS
  Administered 2021-07-12: 1000 mL via INTRAVENOUS

## 2021-07-12 MED ORDER — ONDANSETRON HCL (PF) 4 MG/2 ML INJECTION SOLUTION
4.0000 mg | INTRAMUSCULAR | Status: AC
Start: 2021-07-12 — End: 2021-07-12
  Administered 2021-07-12: 4 mg via INTRAVENOUS

## 2021-07-12 MED ORDER — SODIUM CHLORIDE 0.9 % (FLUSH) INJECTION SYRINGE
3.0000 mL | INJECTION | Freq: Three times a day (TID) | INTRAMUSCULAR | Status: DC
Start: 2021-07-12 — End: 2021-07-13
  Administered 2021-07-12: 3 mL

## 2021-07-12 MED ORDER — FAMOTIDINE 40 MG TABLET
40.0000 mg | ORAL_TABLET | Freq: Two times a day (BID) | ORAL | 0 refills | Status: DC
Start: 2021-07-12 — End: 2021-11-29

## 2021-07-12 NOTE — ED Nurses Note (Signed)
Patient discharged home with family.  AVS reviewed with patient/care giver.  A written copy of the AVS and discharge instructions was given to the patient/care giver. Scripts handed to patient/care giver. Questions sufficiently answered as needed.  Patient/care giver encouraged to follow up with PCP as indicated.  In the event of an emergency, patient/care giver instructed to call 911 or go to the nearest emergency room.

## 2021-07-12 NOTE — ED Triage Notes (Signed)
Pt states he had EGD this past Friday and has been experiencing nausea since

## 2021-07-12 NOTE — Discharge Instructions (Signed)
Avoid alcohol and NSAID like motrin aleve aspirin. Close follow up. Return for any concerns any time.

## 2021-07-12 NOTE — ED Provider Notes (Signed)
[facility], [facility] Emergency Department  ED Primary Provider Note  History of Present Illness   Chief Complaint   Patient presents with   . Nausea     Arrival: The patient arrived by Car    Steven Parrish is a 51 y.o. male who had concerns including Nausea.  Pt states  Nauseated all of the time. Had EGD last week. Has had gi bleed in the past. States no blood in emesis today. Admits to etoh use.     Review of Systems   Constitutional: No fever, chills or weakness   Skin: No rash or diaphoresis  HENT: No headaches, or congestion  Eyes: No vision changes or photophobia   Cardio: No chest pain, palpitations or leg swelling   Respiratory: No cough, wheezing or SOB  GI:  + nausea, vomiting  No  stool changes  GU:  No dysuria, hematuria, or increased frequency  MSK: No muscle aches, joint or back pain  Neuro: No seizures, LOC, numbness, tingling, or focal weakness  Psychiatric: No depression, SI or substance abuse  All other systems reviewed and are negative.    Historical Data   History Reviewed This Encounter:  All noted and reviewed.     Physical Exam   ED Triage Vitals [07/12/21 2147]   BP (Non-Invasive) 111/71   Heart Rate (!) 101   Respiratory Rate 15   Temperature 36.4 C (97.6 F)   SpO2 93 %   Weight 92.5 kg (204 lb)   Height 1.803 m (5' 11" )     }  Constitutional:  51 y.o. male who appears in no distress. Normal color, no cyanosis.   HENT:   Head: Normocephalic and atraumatic.   Mouth/Throat: Oropharynx is clear and moist.   Eyes: EOMI, PERRL   Neck: Trachea midline. Neck supple.  Cardiovascular: RRR, No murmurs, rubs or gallops. Intact distal pulses.  Pulmonary/Chest: BS equal bilaterally. No respiratory distress. No wheezes, rales or chest tenderness.   Abdominal: Bowel sounds present and normal. Abdomen soft, ruq,epigastric tenderness, no rebound and no guarding.  Back: No midline spinal tenderness, no paraspinal tenderness, no CVA tenderness.            Musculoskeletal: No edema, tenderness or deformity.  Skin: warm and dry. No rash, erythema, pallor or cyanosis  Psychiatric: normal mood and affect. Behavior is normal.   Neurological: Patient keenly alert and responsive, easily able to raise eyebrows, facial muscles/expressions symmetric, speaking in fluent sentences, moving all extremities equally and fully, normal gait  Patient Data     Labs Ordered/Reviewed   COMPREHENSIVE METABOLIC PANEL, NON-FASTING - Abnormal; Notable for the following components:       Result Value    CREATININE 1.47 (*)     ESTIMATED GFR 58 (*)     GLUCOSE 117 (*)     ALKALINE PHOSPHATASE 259 (*)     ALT (SGPT) 91 (*)     AST (SGOT) 103 (*)     All other components within normal limits    Narrative:     Estimated Glomerular Filtration Rate (eGFR) is calculated using the CKD-EPI (2021) equation, intended for patients 63 years of age and older. If gender is not documented or "unknown", there will be no eGFR calculation.   MAGNESIUM - Abnormal; Notable for the following components:    MAGNESIUM 1.7 (*)     All other components within normal limits   ETHANOL, SERUM - Abnormal; Notable for the following components:  ETHANOL 80 (*)     All other components within normal limits   PTT (PARTIAL THROMBOPLASTIN TIME) - Abnormal; Notable for the following components:    APTT 35.1 (*)     All other components within normal limits   CBC WITH DIFF - Abnormal; Notable for the following components:    HGB 13.3 (*)     HCT 40.8 (*)     RDW 17.2 (*)     All other components within normal limits   LIPASE - Normal   PT/INR - Normal   CBC/DIFF    Narrative:     The following orders were created for panel order CBC/DIFF.  Procedure                               Abnormality         Status                     ---------                               -----------         ------                     CBC WITH ZYYQ[[id number]]                Abnormal            Final result                 Please view results for these  tests on the individual orders.     No orders to display     Medical Decision Making  dif dx of GERD pud, alcoholic gastritis. Gi bleed, varices.       Medications Administered in the ED   NS flush syringe (3 mL Intracatheter Given 07/12/21 2212)   NS flush syringe (has no administration in time range)   NS bolus infusion 1,000 mL (1,000 mL Intravenous New Bag/New Syringe 07/12/21 2212)   magnesium sulfate 1 G in D5W 100 mL premix IVPB (1 g Intravenous New Bag/New Syringe 07/12/21 2245)   ondansetron (ZOFRAN) 2 mg/mL injection (4 mg Intravenous Given 07/12/21 2212)   pantoprazole (PROTONIX) injection (80 mg Intravenous Given 07/12/21 2212)     Clinical Impression   Gastritis without bleeding, unspecified chronicity, unspecified gastritis type (Primary)   Vomiting, unspecified vomiting type, unspecified whether nausea present   Hypomagnesemia       Disposition: Discharged

## 2021-08-02 ENCOUNTER — Encounter (HOSPITAL_BASED_OUTPATIENT_CLINIC_OR_DEPARTMENT_OTHER): Payer: Self-pay | Admitting: FAMILY PRACTICE

## 2021-08-02 ENCOUNTER — Emergency Department (HOSPITAL_BASED_OUTPATIENT_CLINIC_OR_DEPARTMENT_OTHER): Payer: 59

## 2021-08-02 ENCOUNTER — Other Ambulatory Visit: Payer: Self-pay

## 2021-08-02 ENCOUNTER — Emergency Department
Admission: EM | Admit: 2021-08-02 | Discharge: 2021-08-02 | Discharge status: Against Medical Advice | Payer: 59 | Attending: FAMILY PRACTICE | Admitting: FAMILY PRACTICE

## 2021-08-02 ENCOUNTER — Emergency Department (HOSPITAL_BASED_OUTPATIENT_CLINIC_OR_DEPARTMENT_OTHER): Payer: MEDICAID

## 2021-08-02 DIAGNOSIS — Z5329 Procedure and treatment not carried out because of patient's decision for other reasons: Secondary | ICD-10-CM | POA: Insufficient documentation

## 2021-08-02 DIAGNOSIS — F17210 Nicotine dependence, cigarettes, uncomplicated: Secondary | ICD-10-CM | POA: Insufficient documentation

## 2021-08-02 DIAGNOSIS — F10239 Alcohol dependence with withdrawal, unspecified: Secondary | ICD-10-CM | POA: Insufficient documentation

## 2021-08-02 DIAGNOSIS — R9431 Abnormal electrocardiogram [ECG] [EKG]: Secondary | ICD-10-CM | POA: Insufficient documentation

## 2021-08-02 DIAGNOSIS — Z8781 Personal history of (healed) traumatic fracture: Secondary | ICD-10-CM | POA: Insufficient documentation

## 2021-08-02 DIAGNOSIS — K7460 Unspecified cirrhosis of liver: Secondary | ICD-10-CM | POA: Insufficient documentation

## 2021-08-02 DIAGNOSIS — Z8701 Personal history of pneumonia (recurrent): Secondary | ICD-10-CM | POA: Insufficient documentation

## 2021-08-02 DIAGNOSIS — F1121 Opioid dependence, in remission: Secondary | ICD-10-CM

## 2021-08-02 DIAGNOSIS — M545 Low back pain, unspecified: Secondary | ICD-10-CM | POA: Insufficient documentation

## 2021-08-02 DIAGNOSIS — F1120 Opioid dependence, uncomplicated: Secondary | ICD-10-CM | POA: Insufficient documentation

## 2021-08-02 DIAGNOSIS — R519 Headache, unspecified: Secondary | ICD-10-CM | POA: Insufficient documentation

## 2021-08-02 DIAGNOSIS — J449 Chronic obstructive pulmonary disease, unspecified: Secondary | ICD-10-CM | POA: Insufficient documentation

## 2021-08-02 DIAGNOSIS — G8929 Other chronic pain: Secondary | ICD-10-CM | POA: Insufficient documentation

## 2021-08-02 DIAGNOSIS — J9601 Acute respiratory failure with hypoxia: Secondary | ICD-10-CM | POA: Insufficient documentation

## 2021-08-02 DIAGNOSIS — Z8709 Personal history of other diseases of the respiratory system: Secondary | ICD-10-CM | POA: Insufficient documentation

## 2021-08-02 DIAGNOSIS — Z20822 Contact with and (suspected) exposure to covid-19: Secondary | ICD-10-CM | POA: Insufficient documentation

## 2021-08-02 LAB — PT/INR
INR: 1.12 — ABNORMAL HIGH (ref 0.88–1.10)
PROTHROMBIN TIME: 11.9 seconds (ref 9.8–12.7)

## 2021-08-02 LAB — ARTERIAL BLOOD GAS/LACTATE
%FIO2 (ARTERIAL): 21 %
BASE EXCESS (ARTERIAL): 0.7 mmol/L (ref <=2.0)
BICARBONATE (ARTERIAL): 25.6 mmol/L (ref 20.0–26.0)
CARBOXYHEMOGLOBIN: 12.2 % (ref <=1.5)
LACTATE: 0.5 mmol/L (ref <=4.0)
MET-HEMOGLOBIN: 0.1 % (ref <=2.0)
O2CT: 14.9 %
OXYHEMOGLOBIN: 82.5 % — CL (ref 88.0–100.0)
PCO2 (ARTERIAL): 41 mm/Hg (ref 35–45)
PH (ARTERIAL): 7.41 (ref 7.35–7.45)
PO2 (ARTERIAL): 61 mm/Hg — ABNORMAL LOW (ref 80–100)

## 2021-08-02 LAB — COMPREHENSIVE METABOLIC PANEL, NON-FASTING
ALBUMIN/GLOBULIN RATIO: 0.9 (ref 0.8–1.4)
ALBUMIN: 3.9 g/dL (ref 3.4–5.0)
ALKALINE PHOSPHATASE: 144 U/L — ABNORMAL HIGH (ref 46–116)
ALT (SGPT): 34 U/L (ref <=78)
ANION GAP: 9 mmol/L — ABNORMAL LOW (ref 10–20)
AST (SGOT): 22 U/L (ref 15–37)
BILIRUBIN TOTAL: 0.5 mg/dL (ref 0.2–1.0)
BUN/CREA RATIO: 11
BUN: 9 mg/dL (ref 7–18)
CALCIUM, CORRECTED: 9 mg/dL
CALCIUM: 8.9 mg/dL (ref 8.5–10.1)
CHLORIDE: 102 mmol/L (ref 98–107)
CO2 TOTAL: 27 mmol/L (ref 21–32)
CREATININE: 0.85 mg/dL (ref 0.70–1.30)
ESTIMATED GFR: 107 mL/min/{1.73_m2} (ref >59)
GLOBULIN: 4.3
GLUCOSE: 108 mg/dL — ABNORMAL HIGH (ref 74–106)
OSMOLALITY, CALCULATED: 275 mOsm/kg (ref 270–290)
POTASSIUM: 3.3 mmol/L — ABNORMAL LOW (ref 3.5–5.1)
PROTEIN TOTAL: 8.2 g/dL (ref 6.4–8.2)
SODIUM: 138 mmol/L (ref 136–145)

## 2021-08-02 LAB — CBC WITH DIFF
BASOPHIL #: 0.03 10*3/uL (ref 0.00–0.30)
BASOPHIL %: 0 % (ref 0–3)
EOSINOPHIL #: 0.2 10*3/uL (ref 0.00–0.80)
EOSINOPHIL %: 4 % (ref 0–7)
HCT: 38.4 % — ABNORMAL LOW (ref 42.0–51.0)
HGB: 12.7 g/dL — ABNORMAL LOW (ref 13.5–18.0)
LYMPHOCYTE #: 2.04 10*3/uL (ref 1.10–5.00)
LYMPHOCYTE %: 39 % (ref 25–45)
MCH: 30.1 pg (ref 27.0–32.0)
MCHC: 33 g/dL (ref 32.0–36.0)
MCV: 91.1 fL (ref 78.0–99.0)
MONOCYTE #: 0.66 10*3/uL (ref 0.00–1.30)
MONOCYTE %: 13 % — ABNORMAL HIGH (ref 0–12)
MPV: 8 fL (ref 7.4–10.4)
NEUTROPHIL #: 2.28 10*3/uL (ref 1.80–8.40)
NEUTROPHIL %: 44 % (ref 40–76)
PLATELETS: 249 10*3/uL (ref 140–440)
RBC: 4.21 10*6/uL (ref 4.20–6.00)
RDW: 17.7 % — ABNORMAL HIGH (ref 11.6–14.8)
WBC: 5.2 10*3/uL (ref 4.0–10.5)

## 2021-08-02 LAB — TROPONIN-I: TROPONIN I: <2 ng/L (ref <20)

## 2021-08-02 LAB — ECG 12 LEAD
Atrial Rate: 91 {beats}/min
Calculated R Axis: 50 degrees
Calculated T Axis: -4 degrees
PR Interval: 152 ms
QTC Calculation: 440 ms

## 2021-08-02 LAB — ETHANOL, SERUM/PLASMA: ETHANOL: <3 mg/dL (ref <=3)

## 2021-08-02 LAB — MAGNESIUM: MAGNESIUM: 1.9 mg/dL (ref 1.8–2.4)

## 2021-08-02 LAB — COVID-19, FLU A/B, RSV RAPID BY PCR
INFLUENZA VIRUS TYPE A: NOT DETECTED
INFLUENZA VIRUS TYPE B: NOT DETECTED
RESPIRATORY SYNCTIAL VIRUS (RSV): NOT DETECTED
SARS-CoV-2: NOT DETECTED

## 2021-08-02 LAB — LACTIC ACID LEVEL W/ REFLEX FOR LEVEL >2.0: LACTIC ACID: 0.7 mmol/L (ref 0.4–2.0)

## 2021-08-02 LAB — NT-PROBNP: NT-PROBNP: 305 pg/mL — ABNORMAL HIGH (ref <=125)

## 2021-08-02 LAB — PTT (PARTIAL THROMBOPLASTIN TIME): APTT: 35.3 seconds — ABNORMAL HIGH (ref 22.4–31.7)

## 2021-08-02 MED ORDER — SODIUM CHLORIDE 0.9 % INTRAVENOUS PIGGYBACK
INJECTION | INTRAVENOUS | Status: AC
Start: 2021-08-02 — End: 2021-08-02
  Filled 2021-08-02: qty 50

## 2021-08-02 MED ORDER — IPRATROPIUM 0.5 MG-ALBUTEROL 3 MG (2.5 MG BASE)/3 ML NEBULIZATION SOLN
3.0000 mL | INHALATION_SOLUTION | RESPIRATORY_TRACT | Status: AC
Start: 2021-08-02 — End: 2021-08-02
  Administered 2021-08-02: 3 mL via RESPIRATORY_TRACT

## 2021-08-02 MED ORDER — POTASSIUM BICARBONATE-CITRIC ACID 25 MEQ EFFERVESCENT TABLET
50.0000 meq | EFFERVESCENT_TABLET | ORAL | Status: AC
Start: 2021-08-02 — End: 2021-08-02
  Administered 2021-08-02: 50 meq via ORAL

## 2021-08-02 MED ORDER — METHYLPREDNISOLONE SOD SUCC 125 MG SOLUTION FOR INJECTION WRAPPER
125.0000 mg | INTRAVENOUS | Status: DC
Start: 2021-08-02 — End: 2021-08-02
  Administered 2021-08-02: 0 mg via INTRAVENOUS

## 2021-08-02 MED ORDER — CEFEPIME 2 GRAM SOLUTION FOR INJECTION
INTRAMUSCULAR | Status: AC
Start: 2021-08-02 — End: 2021-08-02
  Filled 2021-08-02: qty 12.5

## 2021-08-02 MED ORDER — POTASSIUM BICARBONATE-CITRIC ACID 25 MEQ EFFERVESCENT TABLET
EFFERVESCENT_TABLET | ORAL | Status: AC
Start: 2021-08-02 — End: 2021-08-02
  Filled 2021-08-02: qty 2

## 2021-08-02 MED ORDER — SODIUM CHLORIDE 0.9 % INTRAVENOUS PIGGYBACK
2.0000 g | Freq: Two times a day (BID) | INTRAVENOUS | Status: DC
Start: 2021-08-02 — End: 2021-08-02
  Administered 2021-08-02: 2 g via INTRAVENOUS
  Administered 2021-08-02: 0 g via INTRAVENOUS

## 2021-08-02 MED ORDER — SODIUM CHLORIDE 0.9 % (FLUSH) INJECTION SYRINGE
3.0000 mL | INJECTION | INTRAMUSCULAR | Status: DC | PRN
Start: 2021-08-02 — End: 2021-08-02

## 2021-08-02 MED ORDER — METHYLPREDNISOLONE SOD SUCC 125 MG SOLUTION FOR INJECTION WRAPPER
INTRAVENOUS | Status: AC
Start: 2021-08-02 — End: 2021-08-02
  Filled 2021-08-02: qty 2

## 2021-08-02 MED ORDER — SODIUM CHLORIDE 0.9 % (FLUSH) INJECTION SYRINGE
3.0000 mL | INJECTION | Freq: Three times a day (TID) | INTRAMUSCULAR | Status: DC
Start: 2021-08-02 — End: 2021-08-02

## 2021-08-02 MED ORDER — IPRATROPIUM 0.5 MG-ALBUTEROL 3 MG (2.5 MG BASE)/3 ML NEBULIZATION SOLN
INHALATION_SOLUTION | RESPIRATORY_TRACT | Status: AC
Start: 2021-08-02 — End: 2021-08-02
  Filled 2021-08-02: qty 3

## 2021-08-02 NOTE — ED Nurses Note (Signed)
Patient medicated as per orders, remains on 2L at this time, will continue to monitor. Denies any current needs.

## 2021-08-02 NOTE — ED Nurses Note (Signed)
Patient instructed to return with any concerns, leaving ambulatory.

## 2021-08-02 NOTE — ED Nurses Note (Signed)
Patient leaving AMA at this time, informed of risks/benefits of leaving AMA. Dr. Cristela Felt and this nurse along with charge nurse in to speak with patient and he insists on leaving AMA. Oxygen has been 95% on RA.

## 2021-08-02 NOTE — ED Provider Notes (Signed)
[facility], [facility] Emergency Department  ED Primary Provider Note  History of Present Illness   Chief Complaint   Patient presents with   . Shortness of Breath     Steven Parrish is a 51 y.o. male who had concerns including Shortness of Breath.  Arrival: The patient arrived by Car    This 51 year old male patient presents emergency department progressive shortness of breath and weakness after leaving the emergency department at the [facility] medical center after receiving antibiotics for pneumonia that he thought was bilateral pneumonia per the staff.  Patient states that he was admitted to hospital fourteen days ago with bilateral lower lobe pneumonia, left 2 days after admission due to not receiving adequate analgesia or medications for his alcohol withdrawal.  He says he drinks 2-1/2 pt of vodka a day, crushes and shoots 24 mg of Dilaudid today when he has it, currently is only using 16 mg a day you can not find affordable Dilaudid on the streets.  He also continues to smoke on average about an active cigarettes a day.  Has a history of cirrhosis, COPD, pneumonia and chronic pain with back fractures twice.  He also complains of nasal congestion and drainage, facial pressure and "chronic sinus issues".  He denies headache but does have facial pain, denies body aches beyond the norm but states his mid and low back pain are severe currently.        Review of Systems   Pertinent positive and negative ROS as per HPI.  Historical Data   History Reviewed This Encounter:  Patient's past medical surgical social history reviewed in the chart, and obtained from the patient reviewed and noted.    Physical Exam   ED Triage Vitals [08/02/21 1440]   BP (Non-Invasive) (!) 144/97   Heart Rate 93   Respiratory Rate 20   Temperature 36.9 C (98.4 F)   SpO2 98 %   Weight 90.7 kg (200 lb)   Height 1.803 m (5' 11" )     Physical Exam   General:  No acute  distress nontoxic, does appear to have some pain, does not appear severe at this time.    Ears: TMs clear without retraction   Nasal:  Erythematous mucosa, swollen turbinates, thick yellow mucus bilaterally.    Oral:  Pink and moist   Oropharynx: Injected with thick yellow-green PND noted without exudate or petechiae   Neck:  No accessory muscle use to breathe, trachea is in midline.  No lymphadenopathy.    Lungs:  Distant symmetrical, appear to be clear.    Heart: Regular rate rhythm S1-S2 without murmur gallop, distant   Abdomen:  Obese, soft, no obvious tenderness guarding or rebound pain.  Normal bowel sounds   Extremities:  Moving extremities symmetrically   Skin:  No suspicious rashes or lesions  Neurological:  No gross focal motor sensory deficits.      Patient Data     Labs Ordered/Reviewed   ARTERIAL BLOOD GAS/LACTATE - Abnormal; Notable for the following components:       Result Value    CARBOXYHEMOGLOBIN 12.2 (*)     PO2 (ARTERIAL) 61 (*)     OXYHEMOGLOBIN 82.5 (*)     All other components within normal limits   NT-PROBNP - Abnormal; Notable for the following components:    NT-PROBNP 305 (*)     All other components within normal limits   COMPREHENSIVE METABOLIC PANEL, NON-FASTING - Abnormal; Notable for  the following components:    POTASSIUM 3.3 (*)     ANION GAP 9 (*)     GLUCOSE 108 (*)     ALKALINE PHOSPHATASE 144 (*)     All other components within normal limits    Narrative:     Estimated Glomerular Filtration Rate (eGFR) is calculated using the CKD-EPI (2021) equation, intended for patients 37 years of age and older. If gender is not documented or "unknown", there will be no eGFR calculation.   PT/INR - Abnormal; Notable for the following components:    INR 1.12 (*)     All other components within normal limits   PTT (PARTIAL THROMBOPLASTIN TIME) - Abnormal; Notable for the following components:    APTT 35.3 (*)     All other components within normal limits   CBC WITH DIFF - Abnormal; Notable for  the following components:    HGB 12.7 (*)     HCT 38.4 (*)     RDW 17.7 (*)     MONOCYTE % 13 (*)     All other components within normal limits   LACTIC ACID LEVEL W/ REFLEX FOR LEVEL >2.0 - Normal   COVID-19, FLU A/B, RSV RAPID BY PCR - Normal    Narrative:     Results are for the simultaneous qualitative identification of SARS-CoV-2 (formerly 2019-nCoV), Influenza A, Influenza B, and RSV RNA. These etiologic agents are generally detectable in nasopharyngeal and nasal swabs during the ACUTE PHASE of infection. Hence, this test is intended to be performed on respiratory specimens collected from individuals with signs and symptoms of upper respiratory tract infection who meet Centers for Disease Control and Prevention (CDC) clinical and/or epidemiological criteria for Coronavirus Disease 2019 (COVID-19) testing. CDC COVID-19 criteria for testing on human specimens is available at [facility] webpage information for Healthcare Professionals: Coronavirus Disease 2019 (COVID-19) ([email or web address]).     False-negative results may occur if the virus has genomic mutations, insertions, deletions, or rearrangements or if performed very early in the course of illness. Otherwise, negative results indicate virus specific RNA targets are not detected, however negative results do not preclude SARS-CoV-2 infection/COVID-19, Influenza, or Respiratory syncytial virus infection. Results should not be used as the sole basis for patient management decisions. Negative results must be combined with clinical observations, patient history, and epidemiological information. If upper respiratory tract infection is still suspected based on exposure history together with other clinical findings, re-testing should be considered.    Disclaimer:   This assay has been authorized by FDA under an Emergency Use Authorization for use in laboratories certified under the Clinical Laboratory Improvement Amendments of 1988  (CLIA), 42 U.S.C. [phone], to perform high complexity tests. The impacts of vaccines, antiviral therapeutics, antibiotics, chemotherapeutic or immunosuppressant drugs have not been evaluated.     Test methodology:   Cepheid Xpert Xpress SARS-CoV-2/Flu/RSV Assay real-time polymerase chain reaction (RT-PCR) test on the GeneXpert Dx and Xpert Xpress systems.   TROPONIN-I - Normal    Narrative:     Values received on females ranging between 12-15 ng/L MUST include the next serial troponin to review changes in the delta differences as the reference range for the Access II chemistry analyzer is lower than the established reference range.     ETHANOL, SERUM - Normal   MAGNESIUM - Normal   CBC/DIFF    Narrative:     The following orders were created for panel order CBC/DIFF.  Procedure  Abnormality         Status                     ---------                               -----------         ------                     CBC WITH DIFF[[id number]]                Abnormal            Final result                 Please view results for these tests on the individual orders.   URINE DRUG SCREEN     XR AP MOBILE CHEST   Final Result by Edi, Radresults In (06/24 1516)   NO ACUTE FINDINGS.         Radiologist location ID: [place] Decision Making        Medical Decision Making  High complexity due presentation, comorbidities, and substance addiction it is abuse    Amount and/or Complexity of Data Reviewed  Labs: ordered.  Radiology: ordered.  ECG/medicine tests: ordered and independent interpretation performed.      Risk  Prescription drug management.        ED Course as of 08/02/21 1737   Sat Aug 02, 2021   1730 The patient is signing out AMA, even after discussing the need for supplemental oxygen, due to the low oxygen saturations on the ABG and also advised him that he would drop the oxygen levels even more with sleep which can be dangerous, and he can have a heart attack, heart  dysrhythmia or even die.  He still says that he cannot go through narcotic withdrawal and will not be admitted to the hospital.  [place] papers to be signed.     1734 Patient has already removed his oxygen, and monitor, so we can not get a remember oxygen level.         Medications Administered in the ED   NS flush syringe (has no administration in time range)   NS flush syringe (has no administration in time range)   methylPREDNISolone sod succ (SOLU-MEDROL) 125 mg/2 mL injection (0 mg Intravenous Not Given 08/02/21 1600)   cefepime (MAXIPIME) 2 g in NS 50 mL IVPB minibag (2 g Intravenous New Bag/New Syringe 08/02/21 1628)   ipratropium-albuterol 0.5 mg-3 mg(2.5 mg base)/3 mL Solution for Nebulization (3 mL Nebulization Given 08/02/21 1500)   potassium bicarbonate-citric acid (EFFER-K) effervescent tablet (50 mEq Oral Given 08/02/21 1623)     Clinical Impression   Acute hypoxemic respiratory failure (CMS HCC) (Primary)   History of narcotic addiction (CMS HCC)   Alcohol dependence with withdrawal with complication (CMS HCC)   Chronic low back pain without sciatica, unspecified back pain laterality       Disposition: AMA    .Marland KitchenBretta Bang, DO

## 2021-08-02 NOTE — ED Nurses Note (Signed)
Patient placed on 2L via NC per Dr. Cristela Felt order after ABG.

## 2021-08-02 NOTE — ED Triage Notes (Signed)
Seen 2 weeks ago at [facility] told he had beginning stages of double pneumonia. He signed out after only antibiotic treatment. Left without antibiotic. Symptoms are continuing. Feeling getting worse 10 days but did not go back to [place]. Symptoms fatigue and weakness

## 2021-08-02 NOTE — ED Nurses Note (Signed)
Patient reports recent double pneumonia dx, was treated with abx, but feels as if the pneumonia is still present. Reports some shortness of breath, weakness, and fatigue. Breath sounds decreased bilaterally, clear. Patient RR even and unlabored. Cough with minimal sputum.

## 2021-08-04 LAB — ECG 12 LEAD
Calculated P Axis: 40 degrees
QRS Duration: 84 ms
QT Interval: 358 ms
Ventricular rate: 91 {beats}/min

## 2021-09-07 ENCOUNTER — Other Ambulatory Visit: Payer: Self-pay

## 2021-09-07 ENCOUNTER — Emergency Department
Admission: EM | Admit: 2021-09-07 | Discharge: 2021-09-07 | Disposition: A | Discharge status: Home / Self Care | Payer: 59 | Attending: Family Medicine | Admitting: Family Medicine

## 2021-09-07 ENCOUNTER — Encounter (HOSPITAL_BASED_OUTPATIENT_CLINIC_OR_DEPARTMENT_OTHER): Payer: Self-pay

## 2021-09-07 ENCOUNTER — Emergency Department (HOSPITAL_BASED_OUTPATIENT_CLINIC_OR_DEPARTMENT_OTHER): Payer: 59

## 2021-09-07 DIAGNOSIS — K047 Periapical abscess without sinus: Secondary | ICD-10-CM | POA: Insufficient documentation

## 2021-09-07 DIAGNOSIS — Z87891 Personal history of nicotine dependence: Secondary | ICD-10-CM | POA: Insufficient documentation

## 2021-09-07 DIAGNOSIS — Z20822 Contact with and (suspected) exposure to covid-19: Secondary | ICD-10-CM | POA: Insufficient documentation

## 2021-09-07 DIAGNOSIS — R509 Fever, unspecified: Secondary | ICD-10-CM | POA: Insufficient documentation

## 2021-09-07 DIAGNOSIS — Z8701 Personal history of pneumonia (recurrent): Secondary | ICD-10-CM | POA: Insufficient documentation

## 2021-09-07 HISTORY — DX: Peptic ulcer, site unspecified, unspecified as acute or chronic, without hemorrhage or perforation: K27.9

## 2021-09-07 HISTORY — DX: Diaphragmatic hernia without obstruction or gangrene: K44.9

## 2021-09-07 LAB — CBC WITH DIFF
BASOPHIL #: 0.02 10*3/uL (ref 0.00–0.30)
BASOPHIL %: 0 % (ref 0–3)
EOSINOPHIL #: 0.06 10*3/uL (ref 0.00–0.80)
EOSINOPHIL %: 1 % (ref 0–7)
HCT: 40.3 % — ABNORMAL LOW (ref 42.0–51.0)
HGB: 13.7 g/dL (ref 13.5–18.0)
LYMPHOCYTE #: 0.91 10*3/uL — ABNORMAL LOW (ref 1.10–5.00)
LYMPHOCYTE %: 13 % — ABNORMAL LOW (ref 25–45)
MCH: 31.3 pg (ref 27.0–32.0)
MCHC: 34 g/dL (ref 32.0–36.0)
MCV: 92.3 fL (ref 78.0–99.0)
MONOCYTE #: 0.82 10*3/uL (ref 0.00–1.30)
MONOCYTE %: 12 % (ref 0–12)
MPV: 7.7 fL (ref 7.4–10.4)
NEUTROPHIL #: 5.14 10*3/uL (ref 1.80–8.40)
NEUTROPHIL %: 74 % (ref 40–76)
PLATELETS: 209 10*3/uL (ref 140–440)
RBC: 4.37 10*6/uL (ref 4.20–6.00)
RDW: 17.4 % — ABNORMAL HIGH (ref 11.6–14.8)
WBC: 6.9 10*3/uL (ref 4.0–10.5)

## 2021-09-07 LAB — COMPREHENSIVE METABOLIC PANEL, NON-FASTING
ALBUMIN/GLOBULIN RATIO: 1 (ref 0.8–1.4)
ALBUMIN: 4.2 g/dL (ref 3.4–5.0)
ALKALINE PHOSPHATASE: 143 U/L — ABNORMAL HIGH (ref 46–116)
ALT (SGPT): 32 U/L (ref <=78)
ANION GAP: 11 mmol/L (ref 10–20)
AST (SGOT): 30 U/L (ref 15–37)
BILIRUBIN TOTAL: 0.9 mg/dL (ref 0.2–1.0)
BUN/CREA RATIO: 9
BUN: 9 mg/dL (ref 7–18)
CALCIUM, CORRECTED: 9.2 mg/dL
CALCIUM: 9.4 mg/dL (ref 8.5–10.1)
CHLORIDE: 99 mmol/L (ref 98–107)
CO2 TOTAL: 24 mmol/L (ref 21–32)
CREATININE: 0.95 mg/dL (ref 0.70–1.30)
ESTIMATED GFR: 98 mL/min/{1.73_m2} (ref >59)
GLOBULIN: 4.4
GLUCOSE: 115 mg/dL — ABNORMAL HIGH (ref 74–106)
OSMOLALITY, CALCULATED: 268 mOsm/kg — ABNORMAL LOW (ref 270–290)
POTASSIUM: 3.7 mmol/L (ref 3.5–5.1)
PROTEIN TOTAL: 8.6 g/dL — ABNORMAL HIGH (ref 6.4–8.2)
SODIUM: 134 mmol/L — ABNORMAL LOW (ref 136–145)

## 2021-09-07 LAB — COVID-19, FLU A/B, RSV RAPID BY PCR
INFLUENZA VIRUS TYPE A: NOT DETECTED
INFLUENZA VIRUS TYPE B: NOT DETECTED
RESPIRATORY SYNCTIAL VIRUS (RSV): NOT DETECTED
SARS-CoV-2: NOT DETECTED

## 2021-09-07 LAB — LACTIC ACID LEVEL W/ REFLEX FOR LEVEL >2.0: LACTIC ACID: 0.6 mmol/L (ref 0.4–2.0)

## 2021-09-07 MED ORDER — IBUPROFEN 800 MG TABLET
ORAL_TABLET | ORAL | Status: AC
Start: 2021-09-07 — End: 2021-09-07
  Filled 2021-09-07: qty 1

## 2021-09-07 MED ORDER — IBUPROFEN 800 MG TABLET
800.0000 mg | ORAL_TABLET | ORAL | Status: AC
Start: 2021-09-07 — End: 2021-09-07
  Administered 2021-09-07: 800 mg via ORAL

## 2021-09-07 MED ORDER — CLINDAMYCIN HCL 150 MG CAPSULE
450.0000 mg | ORAL_CAPSULE | Freq: Three times a day (TID) | ORAL | 0 refills | Status: AC
Start: 2021-09-07 — End: 2021-09-14

## 2021-09-07 MED ORDER — CLINDAMYCIN HCL 150 MG CAPSULE
ORAL_CAPSULE | ORAL | Status: AC
Start: 2021-09-07 — End: 2021-09-07
  Filled 2021-09-07: qty 2

## 2021-09-07 MED ORDER — CLINDAMYCIN HCL 150 MG CAPSULE
300.0000 mg | ORAL_CAPSULE | ORAL | Status: AC
Start: 2021-09-07 — End: 2021-09-07
  Administered 2021-09-07: 300 mg via ORAL

## 2021-09-07 NOTE — Discharge Instructions (Signed)
Please stop the penicillin and start the clindamycin.

## 2021-09-07 NOTE — ED Triage Notes (Signed)
Patient reports yesterday began to feel bad. Patient alternates from hot and cold/clammy, headaches, muscle pain all over. Patient currently on day 3 of penicillin for tooth abscess.

## 2021-09-07 NOTE — ED Nurses Note (Signed)
Patient discharged home with family.  AVS reviewed with patient/care giver.  A written copy of the AVS and discharge instructions was given to the patient/care giver. Scripts handed to patient/care giver. Questions sufficiently answered as needed.  Patient/care giver encouraged to follow up with PCP as indicated.  In the event of an emergency, patient/care giver instructed to call 911 or go to the nearest emergency room.

## 2021-09-07 NOTE — ED Provider Notes (Signed)
[facility], [facility] Emergency Department  ED Primary Provider Note  History of Present Illness   Chief Complaint   Patient presents with    Flu Like Symptoms     Steven Parrish is a 51 y.o. male who had concerns including Flu Like Symptoms.  Arrival: The patient arrived by Car    51 year old male patient presents with a chief complaint 2 day history of body aches, diaphoresis, dizziness and fever.  Patient denies any known sick contacts.  Does report that he has 2 abscessed teeth and was currently placed on penicillin 3 days ago.  Does report a history of smoking states he did have a recent pneumonia.    Review of Systems   Pertinent positive and negative ROS as per HPI.  Historical Data   History Reviewed This Encounter: Medical History  Surgical History  Family History  Social History    Physical Exam   ED Triage Vitals [09/07/21 1830]   BP (Non-Invasive) (!) 136/99   Heart Rate (!) 129   Respiratory Rate 16   Temperature (!) 38.2 C (100.8 F)   SpO2 93 %   Weight 90.7 kg (200 lb)   Height 1.803 m (5' 11" )     Physical Exam  Const: No acute distress, active  HENT:  Normocephalic, left tympanic membrane normal, right tympanic membrane normal, external nose normal nares normal, face is symmetrical, Throat/ posterior oral pharynx normal, mouth pink and moist  EYES:  Pupils perrl  Neck:  Normal visual inspection, full range of motion, no lymphadenopathy  Chest: Normal visual inspection, no tenderness  Resp:  Normal respiratory effort, audible wheezes and no cough  Cardio:  In regular rate and rhythm, S1 noted  GI:  Normal visual inspection soft to palpation  Musculoskeletal:  No CVA tenderness, normal range of motion  Derm:  No rashes or lesions noted, turgor normal.  No obvious wound noted  Neuro:  Patient awake oriented x3 moves all extremities  Psych:  Mental status is normal, speech and movement normal, mood appropriate for age and situation  Exam Notes:   Patient Data      Labs Ordered/Reviewed   COMPREHENSIVE METABOLIC PANEL, NON-FASTING - Abnormal; Notable for the following components:       Result Value    SODIUM 134 (*)     GLUCOSE 115 (*)     ALKALINE PHOSPHATASE 143 (*)     PROTEIN TOTAL 8.6 (*)     OSMOLALITY, CALCULATED 268 (*)     All other components within normal limits    Narrative:     Estimated Glomerular Filtration Rate (eGFR) is calculated using the CKD-EPI (2021) equation, intended for patients 83 years of age and older. If gender is not documented or "unknown", there will be no eGFR calculation.   CBC WITH DIFF - Abnormal; Notable for the following components:    HCT 40.3 (*)     RDW 17.4 (*)     LYMPHOCYTE % 13 (*)     LYMPHOCYTE # 0.91 (*)     All other components within normal limits   COVID-19, FLU A/B, RSV RAPID BY PCR - Normal    Narrative:     Results are for the simultaneous qualitative identification of SARS-CoV-2 (formerly 2019-nCoV), Influenza A, Influenza B, and RSV RNA. These etiologic agents are generally detectable in nasopharyngeal and nasal swabs during the ACUTE PHASE of infection. Hence, this test is intended to be performed on respiratory specimens collected from  individuals with signs and symptoms of upper respiratory tract infection who meet Centers for Disease Control and Prevention (CDC) clinical and/or epidemiological criteria for Coronavirus Disease 2019 (COVID-19) testing. CDC COVID-19 criteria for testing on human specimens is available at [facility] webpage information for Healthcare Professionals: Coronavirus Disease 2019 (COVID-19) ([email or web address]).     False-negative results may occur if the virus has genomic mutations, insertions, deletions, or rearrangements or if performed very early in the course of illness. Otherwise, negative results indicate virus specific RNA targets are not detected, however negative results do not preclude SARS-CoV-2 infection/COVID-19, Influenza, or Respiratory  syncytial virus infection. Results should not be used as the sole basis for patient management decisions. Negative results must be combined with clinical observations, patient history, and epidemiological information. If upper respiratory tract infection is still suspected based on exposure history together with other clinical findings, re-testing should be considered.    Disclaimer:   This assay has been authorized by FDA under an Emergency Use Authorization for use in laboratories certified under the Clinical Laboratory Improvement Amendments of 1988 (CLIA), 42 U.S.C. [phone], to perform high complexity tests. The impacts of vaccines, antiviral therapeutics, antibiotics, chemotherapeutic or immunosuppressant drugs have not been evaluated.     Test methodology:   Cepheid Xpert Xpress SARS-CoV-2/Flu/RSV Assay real-time polymerase chain reaction (RT-PCR) test on the GeneXpert Dx and Xpert Xpress systems.   LACTIC ACID LEVEL W/ REFLEX FOR LEVEL >2.0 - Normal   ADULT ROUTINE BLOOD CULTURE, SET OF 2 BOTTLES (BACTERIA AND YEAST)   ADULT ROUTINE BLOOD CULTURE, SET OF 2 BOTTLES (BACTERIA AND YEAST)   CBC/DIFF    Narrative:     The following orders were created for panel order CBC/DIFF.  Procedure                               Abnormality         Status                     ---------                               -----------         ------                     CBC WITH DIFF[[id number]]                Abnormal            Final result                 Please view results for these tests on the individual orders.     XR CHEST PA AND LATERAL   Final Result by Edi, Radresults In (07/30 1957)   NO ACUTE FINDINGS.         Radiologist location ID: [place] Decision Making        Medical Decision Making  Labs and chest x-ray reviewed no acute findings I do feel this patient fevers coming from his current dental abscess since he still has swollen erythemic gums.  Will change him from penicillin to  clindamycin.    Amount and/or Complexity of Data Reviewed  Labs: ordered.  Radiology: ordered.    Risk  Prescription drug management.      ED Course as  of 09/07/21 2047   Nancy Fetter Sep 07, 2021   1959 Narrative & Impression  Bobo Riva     RADIOLOGIST: Satira Anis, MD     XR CHEST PA AND LATERAL performed on 09/07/2021 7:20 PM     CLINICAL HISTORY: fever, wheeze.  COUGH, CHILLS, BODY ACHES, AND FEVER 2DAYS; HX SMOKER     TECHNIQUE: Frontal and lateral views of the chest.     COMPARISON:  June 2023, June 2021     FINDINGS:     The heart size is normal.  The mediastinal contour is unremarkable.  The lungs are clear.   The bones are unremarkable.        IMPRESSION:  NO ACUTE FINDINGS.                Medications Administered in the ED   clindamycin (CLEOCIN) capsule (has no administration in time range)   ibuprofen (MOTRIN) tablet (800 mg Oral Given 09/07/21 1913)     Clinical Impression   Fever, unspecified fever cause (Primary)   Dental abscess       Disposition: Discharged

## 2021-09-12 LAB — ADULT ROUTINE BLOOD CULTURE, SET OF 2 BOTTLES (BACTERIA AND YEAST)
BLOOD CULTURE, ROUTINE: NO GROWTH
BLOOD CULTURE, ROUTINE: NO GROWTH

## 2021-09-28 ENCOUNTER — Emergency Department: Admission: EM | Admit: 2021-09-28 | Discharge: 2021-09-28 | Discharge status: Against Medical Advice | Payer: 59

## 2021-09-28 DIAGNOSIS — Z5321 Procedure and treatment not carried out due to patient leaving prior to being seen by health care provider: Secondary | ICD-10-CM | POA: Insufficient documentation

## 2021-09-28 NOTE — ED Nurses Note (Signed)
Called -not in the waiting room or outside.

## 2021-11-17 ENCOUNTER — Other Ambulatory Visit: Payer: Self-pay

## 2021-11-29 ENCOUNTER — Emergency Department (HOSPITAL_BASED_OUTPATIENT_CLINIC_OR_DEPARTMENT_OTHER): Payer: 59

## 2021-11-29 ENCOUNTER — Other Ambulatory Visit: Payer: Self-pay

## 2021-11-29 ENCOUNTER — Emergency Department
Admission: EM | Admit: 2021-11-29 | Discharge: 2021-11-29 | Disposition: A | Discharge status: Home / Self Care | Payer: 59 | Attending: Emergency Medicine | Admitting: Emergency Medicine

## 2021-11-29 ENCOUNTER — Encounter (HOSPITAL_BASED_OUTPATIENT_CLINIC_OR_DEPARTMENT_OTHER): Payer: Self-pay | Admitting: Emergency Medicine

## 2021-11-29 DIAGNOSIS — Y909 Presence of alcohol in blood, level not specified: Secondary | ICD-10-CM | POA: Insufficient documentation

## 2021-11-29 DIAGNOSIS — K709 Alcoholic liver disease, unspecified: Secondary | ICD-10-CM | POA: Insufficient documentation

## 2021-11-29 DIAGNOSIS — J069 Acute upper respiratory infection, unspecified: Secondary | ICD-10-CM | POA: Insufficient documentation

## 2021-11-29 DIAGNOSIS — Z1152 Encounter for screening for COVID-19: Secondary | ICD-10-CM | POA: Insufficient documentation

## 2021-11-29 DIAGNOSIS — J40 Bronchitis, not specified as acute or chronic: Secondary | ICD-10-CM | POA: Insufficient documentation

## 2021-11-29 DIAGNOSIS — F17200 Nicotine dependence, unspecified, uncomplicated: Secondary | ICD-10-CM

## 2021-11-29 DIAGNOSIS — F1910 Other psychoactive substance abuse, uncomplicated: Secondary | ICD-10-CM | POA: Insufficient documentation

## 2021-11-29 DIAGNOSIS — F1010 Alcohol abuse, uncomplicated: Secondary | ICD-10-CM | POA: Insufficient documentation

## 2021-11-29 DIAGNOSIS — I864 Gastric varices: Secondary | ICD-10-CM | POA: Insufficient documentation

## 2021-11-29 DIAGNOSIS — K279 Peptic ulcer, site unspecified, unspecified as acute or chronic, without hemorrhage or perforation: Secondary | ICD-10-CM | POA: Insufficient documentation

## 2021-11-29 DIAGNOSIS — I10 Essential (primary) hypertension: Secondary | ICD-10-CM | POA: Insufficient documentation

## 2021-11-29 DIAGNOSIS — I8510 Secondary esophageal varices without bleeding: Secondary | ICD-10-CM | POA: Insufficient documentation

## 2021-11-29 DIAGNOSIS — K7460 Unspecified cirrhosis of liver: Secondary | ICD-10-CM | POA: Insufficient documentation

## 2021-11-29 LAB — COVID-19, FLU A/B, RSV RAPID BY PCR
INFLUENZA VIRUS TYPE A: NOT DETECTED
INFLUENZA VIRUS TYPE B: NOT DETECTED
RESPIRATORY SYNCTIAL VIRUS (RSV): NOT DETECTED
SARS-CoV-2: NOT DETECTED

## 2021-11-29 MED ORDER — AZITHROMYCIN 500 MG TABLET
500.0000 mg | ORAL_TABLET | ORAL | 0 refills | Status: DC
Start: 2021-11-29 — End: 2022-01-28

## 2021-11-29 MED ORDER — AZITHROMYCIN 250 MG TABLET
ORAL_TABLET | ORAL | Status: AC
Start: 2021-11-29 — End: 2021-11-29
  Filled 2021-11-29: qty 2

## 2021-11-29 MED ORDER — BENZONATATE 100 MG CAPSULE
100.0000 mg | ORAL_CAPSULE | ORAL | Status: AC
Start: 2021-11-29 — End: 2021-11-29
  Administered 2021-11-29: 100 mg via ORAL

## 2021-11-29 MED ORDER — AZITHROMYCIN 250 MG TABLET
500.0000 mg | ORAL_TABLET | ORAL | Status: AC
Start: 2021-11-29 — End: 2021-11-29
  Administered 2021-11-29: 500 mg via ORAL

## 2021-11-29 MED ORDER — BENZONATATE 100 MG CAPSULE
100.0000 mg | ORAL_CAPSULE | Freq: Three times a day (TID) | ORAL | 0 refills | Status: DC | PRN
Start: 2021-11-29 — End: 2022-01-28

## 2021-11-29 MED ORDER — BENZONATATE 100 MG CAPSULE
ORAL_CAPSULE | ORAL | Status: AC
Start: 2021-11-29 — End: 2021-11-29
  Filled 2021-11-29: qty 1

## 2021-11-29 NOTE — ED Provider Notes (Signed)
[facility], [facility] Emergency Department  ED Primary Provider Note  History of Present Illness   Chief Complaint   Patient presents with    Congestion     Steven Parrish is a 51 y.o. male who had concerns including Congestion.  Arrival: The patient arrived by Car complaining shortness of breath with exertion over the past 3-4 days.  Patient states he started with a cough a week ago nonproductive and now has become greenish-yellow.  Patient denies any fever chills with it.  Patient states he is having nasal congestion with postnasal drip.  Patient denies any sore throat.  Patient denies any chest pain.  Patient does smoke daily.  Patient also states he has been having myalgias and arthralgias over the past week.  Patient also has a history of hypertension and alcoholic liver disease.  Patient denies any drinking of alcohol presently.  Patient does admit to using drugs and has a long history of IV drug abuse.  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    HPI  Review of Systems   Review of Systems   Constitutional:  Positive for activity change and appetite change. Negative for chills and fever.   HENT:  Positive for congestion, postnasal drip and rhinorrhea. Negative for ear pain and sore throat.    Eyes:  Negative for pain and visual disturbance.   Respiratory:  Positive for cough and shortness of breath.    Cardiovascular:  Negative for chest pain and palpitations.   Gastrointestinal:  Negative for abdominal pain and vomiting.   Genitourinary:  Negative for dysuria and hematuria.   Musculoskeletal:  Positive for arthralgias and myalgias. Negative for back pain.   Skin:  Negative for color change and rash.   Neurological:  Negative for seizures and syncope.   All other systems reviewed and are negative.     Historical Data   History Reviewed This Encounter: Medical History  Surgical History  Family History  Social History    Physical  Exam   ED Triage Vitals [11/29/21 1020]   BP (Non-Invasive) (!) 134/93   Heart Rate 95   Respiratory Rate 18   Temperature 36.6 C (97.9 F)   SpO2 95 %   Weight 90.7 kg (200 lb)   Height 1.803 m (5\' 11" )     Physical Exam  Vitals and nursing note reviewed.   Constitutional:       General: He is not in acute distress.     Appearance: He is well-developed and normal weight.   HENT:      Head: Normocephalic and atraumatic.      Right Ear: External ear normal.      Left Ear: External ear normal.      Nose: Congestion and rhinorrhea present.      Mouth/Throat:      Mouth: Mucous membranes are moist.   Eyes:      Extraocular Movements: Extraocular movements intact.      Conjunctiva/sclera: Conjunctivae normal.      Pupils: Pupils are equal, round, and reactive to light.   Cardiovascular:      Rate and Rhythm: Normal rate and regular rhythm.      Pulses: Normal pulses.      Heart sounds: Normal heart sounds. No murmur heard.  Pulmonary:      Effort: Pulmonary effort is normal. No respiratory distress.      Breath sounds: Normal breath sounds.   Abdominal:  General: Bowel sounds are normal.      Palpations: Abdomen is soft.      Tenderness: There is no abdominal tenderness.   Musculoskeletal:         General: No swelling. Normal range of motion.      Cervical back: Normal range of motion and neck supple.   Skin:     General: Skin is warm and dry.      Capillary Refill: Capillary refill takes less than 2 seconds.   Neurological:      General: No focal deficit present.      Mental Status: He is alert and oriented to person, place, and time.   Psychiatric:         Mood and Affect: Mood normal.         Behavior: Behavior normal.         Thought Content: Thought content normal.         Judgment: Judgment normal.       Patient Data     Labs Ordered/Reviewed   COVID-19, FLU A/B, RSV RAPID BY PCR - Normal    Narrative:     Results are for the simultaneous qualitative identification of SARS-CoV-2 (formerly 2019-nCoV), Influenza  A, Influenza B, and RSV RNA. These etiologic agents are generally detectable in nasopharyngeal and nasal swabs during the ACUTE PHASE of infection. Hence, this test is intended to be performed on respiratory specimens collected from individuals with signs and symptoms of upper respiratory tract infection who meet Centers for Disease Control and Prevention (CDC) clinical and/or epidemiological criteria for Coronavirus Disease 2019 (COVID-19) testing. CDC COVID-19 criteria for testing on human specimens is available at [facility] webpage information for Healthcare Professionals: Coronavirus Disease 2019 (COVID-19) ([employer]).     False-negative results may occur if the virus has genomic mutations, insertions, deletions, or rearrangements or if performed very early in the course of illness. Otherwise, negative results indicate virus specific RNA targets are not detected, however negative results do not preclude SARS-CoV-2 infection/COVID-19, Influenza, or Respiratory syncytial virus infection. Results should not be used as the sole basis for patient management decisions. Negative results must be combined with clinical observations, patient history, and epidemiological information. If upper respiratory tract infection is still suspected based on exposure history together with other clinical findings, re-testing should be considered.    Disclaimer:   This assay has been authorized by FDA under an Emergency Use Authorization for use in laboratories certified under the Clinical Laboratory Improvement Amendments of 1988 (CLIA), 42 U.S.C. [phone], to perform high complexity tests. The impacts of vaccines, antiviral therapeutics, antibiotics, chemotherapeutic or immunosuppressant drugs have not been evaluated.     Test methodology:   Cepheid Xpert Xpress SARS-CoV-2/Flu/RSV Assay real-time polymerase chain reaction (RT-PCR) test on the GeneXpert Dx and Xpert Xpress systems.     XR AP  MOBILE CHEST   Final Result by Edi, [facility] In (10/21 1057)   NO ACUTE FINDINGS.         Radiologist location ID: [place] Decision Making        Medical Decision Making  Patient is 51 year old white male complaining developing cough nonproductive a week ago which is changed to greenish-yellow sputum.  Patient is also having nasal congestion with postnasal drip.  He denies any sore throat.  No chest pain.  Patient does smoke daily.  Patient is complaining of myalgias and arthralgias for the past several days.  Patient denies any  fever chills.  Patient has long history of IVDA abuse as well as alcohol abuse.  He denies using alcohol but still using drugs.  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  Patient denies any black or bloody stools.  No vomiting of blood.  Patient will have chest x-ray done as well as for Plex.  Patient then be treated accordingly for upper respiratory tract infection.  Patient will more than likely be discharged home to follow up with his PMD in the next 3-4 days.    Amount and/or Complexity of Data Reviewed  Radiology: ordered.    Risk  Prescription drug management.             Medications Administered in the ED   azithromycin (ZITHROMAX) tablet (has no administration in time range)   benzonatate (TESSALON) capsule (has no administration in time range)     Clinical Impression   Upper respiratory tract infection, unspecified type (Primary)   Bronchitis       Disposition: Discharged               Clinical Impression   Upper respiratory tract infection, unspecified type (Primary)   Bronchitis       Current Discharge Medication List        START taking these medications    Details   azithromycin (ZITHROMAX) 500 mg Oral Tablet Take 1 Tablet (500 mg total) by mouth Every 24 hours  Qty: 6 Tablet, Refills: 0      benzonatate (TESSALON) 100 mg Oral Capsule Take 1 Capsule (100 mg total) by mouth Every 8 hours as needed for Cough  Qty: 12  Capsule, Refills: 0

## 2021-11-29 NOTE — ED Nurses Note (Signed)
Patient reports that last week he was outside later in the afternoon " I was dressed appropriately but I've noticed some congestion afterward ". Reports that he has some shortness of breath hx asthma and smoker. Reports that the cough has productive green sputum for the last two days, congestion worsening. Increased nasal drainage.

## 2021-11-29 NOTE — ED Triage Notes (Signed)
Patient states started about 7 days ago congestion and is coughing up light green mucous and chest feels like it is getting tighter. Patient states he also has drainage down the back of his throat

## 2021-11-29 NOTE — ED Nurses Note (Signed)
Patient given written and verbal d/c instructions. All questions/concerns addressed. Informed to return with any concerns. Verbalizes understanding of d/c instructions. Informed of x2 prescriptions at present. Patient leaving ambulatory at this time.

## 2021-12-21 ENCOUNTER — Other Ambulatory Visit: Payer: Self-pay

## 2021-12-21 ENCOUNTER — Encounter (HOSPITAL_BASED_OUTPATIENT_CLINIC_OR_DEPARTMENT_OTHER): Payer: Self-pay

## 2021-12-21 ENCOUNTER — Emergency Department
Admission: EM | Admit: 2021-12-21 | Discharge: 2021-12-21 | Disposition: A | Discharge status: Home / Self Care | Payer: MEDICAID | Attending: Family | Admitting: Family

## 2021-12-21 DIAGNOSIS — K029 Dental caries, unspecified: Secondary | ICD-10-CM | POA: Insufficient documentation

## 2021-12-21 DIAGNOSIS — K0889 Other specified disorders of teeth and supporting structures: Secondary | ICD-10-CM | POA: Insufficient documentation

## 2021-12-21 MED ORDER — CEFTRIAXONE 1 GRAM SOLUTION FOR INJECTION
INTRAMUSCULAR | Status: AC
Start: 2021-12-21 — End: 2021-12-21
  Filled 2021-12-21: qty 10

## 2021-12-21 MED ORDER — KETOROLAC 10 MG TABLET
10.0000 mg | ORAL_TABLET | Freq: Four times a day (QID) | ORAL | 0 refills | Status: DC | PRN
Start: 2021-12-21 — End: 2022-01-28

## 2021-12-21 MED ORDER — LIDOCAINE HCL 10 MG/ML (1 %) INJECTION SOLUTION
1.0000 g | Freq: Once | INTRAMUSCULAR | Status: AC
Start: 2021-12-21 — End: 2021-12-21
  Administered 2021-12-21: 1 g via INTRAMUSCULAR

## 2021-12-21 MED ORDER — CLINDAMYCIN HCL 300 MG CAPSULE
300.0000 mg | ORAL_CAPSULE | Freq: Four times a day (QID) | ORAL | 0 refills | Status: DC
Start: 2021-12-21 — End: 2022-01-28

## 2021-12-21 NOTE — ED Provider Notes (Signed)
[facility], [facility] Emergency Department  ED Primary Provider Note  History of Present Illness   Chief Complaint   Patient presents with    Toothache     Arrival: The patient arrived by Car  Steven Parrish is a 51 y.o. male who had concerns including Toothache. Rt upper dental pain denies fever nor diff swallowing.     Review of Systems   Constitutional: No fever, chills or weakness   Skin: No rash or diaphoresis  HENT: No headaches, or congestion + dental pain.   Eyes: No vision changes or photophobia   Cardio: No chest pain, palpitations or leg swelling   Respiratory: No cough, wheezing or SOB  GI:  No nausea, vomiting or stool changes  GU:  No dysuria, hematuria, or increased frequency  MSK: No muscle aches, joint or back pain  Neuro: No seizures, LOC, numbness, tingling, or focal weakness  Psychiatric: No depression, SI or substance abuse  All other systems reviewed and are negative.    Historical Data   History Reviewed This Encounter: all noted and reviewed    Physical Exam   ED Triage Vitals [12/21/21 1949]   BP (Non-Invasive) (!) 133/95   Heart Rate (!) 115   Respiratory Rate 18   Temperature 36.3 C (97.3 F)   SpO2 97 %   Weight 86.2 kg (190 lb)   Height 1.803 m (5\' 11" )       Constitutional:  51 y.o. male who appears in no distress. Normal color, no cyanosis.   HENT:   Head: Normocephalic and atraumatic.   Mouth/Throat: Oropharynx is clear and moist. Multiple dental caries. No facial swelling airway patent. No mandibular swelling.   Eyes: EOMI, PERRL   Neck: Trachea midline. Neck supple.  Cardiovascular: RRR, No murmurs, rubs or gallops. Intact distal pulses.  Pulmonary/Chest: BS equal bilaterally. No respiratory distress. No wheezes, rales or chest tenderness.   Abdominal: Bowel sounds present and normal. Abdomen soft, no tenderness, no rebound and no guarding.  Back: No midline spinal tenderness, no paraspinal tenderness, no CVA tenderness.            Musculoskeletal: No edema, tenderness or deformity.  Skin: warm and dry. No rash, erythema, pallor or cyanosis  Psychiatric: normal mood and affect. Behavior is normal.   Neurological: Patient keenly alert and responsive, easily able to raise eyebrows, facial muscles/expressions symmetric, speaking in fluent sentences, moving all extremities equally and fully, normal gait  Patient Data   Labs Ordered/Reviewed - No data to display  No orders to display     Medical Decision Making   Diff dx dental caries. Dental abscess. Consideration of ludwigs angina - ruled out no on exam. dif swallowing full ROM of mouth no edema.       Medications Administered in the ED   cefTRIAXone (ROCEPHIN) 1 g in lidocaine 2.86 mL (tot vol) IM injection (1 g IntraMUSCULAR Given 12/21/21 2020)     Clinical Impression   Dental caries (Primary)   Dentalgia       Disposition: Discharged

## 2021-12-21 NOTE — ED Triage Notes (Signed)
Pt c/o several teeth that are abscessed.

## 2021-12-22 ENCOUNTER — Telehealth (HOSPITAL_BASED_OUTPATIENT_CLINIC_OR_DEPARTMENT_OTHER): Payer: Self-pay

## 2021-12-22 NOTE — ED Nurses Note (Signed)
Follow-up 1: Requesting call back

## 2021-12-23 ENCOUNTER — Telehealth (HOSPITAL_BASED_OUTPATIENT_CLINIC_OR_DEPARTMENT_OTHER): Payer: Self-pay

## 2021-12-23 NOTE — ED Nurses Note (Signed)
follow-up 2: Left Message

## 2021-12-24 ENCOUNTER — Telehealth (HOSPITAL_BASED_OUTPATIENT_CLINIC_OR_DEPARTMENT_OTHER): Payer: Self-pay

## 2021-12-24 NOTE — ED Nurses Note (Signed)
Follow-up 3: Left message, No further follow-up at this time

## 2022-01-07 ENCOUNTER — Emergency Department
Admission: EM | Admit: 2022-01-07 | Discharge: 2022-01-07 | Discharge status: Against Medical Advice | Payer: 59 | Attending: Emergency Medicine | Admitting: Emergency Medicine

## 2022-01-07 ENCOUNTER — Encounter (HOSPITAL_BASED_OUTPATIENT_CLINIC_OR_DEPARTMENT_OTHER): Payer: Self-pay | Admitting: Emergency Medicine

## 2022-01-07 ENCOUNTER — Other Ambulatory Visit: Payer: Self-pay

## 2022-01-07 DIAGNOSIS — J069 Acute upper respiratory infection, unspecified: Secondary | ICD-10-CM | POA: Insufficient documentation

## 2022-01-07 DIAGNOSIS — Z5329 Procedure and treatment not carried out because of patient's decision for other reasons: Secondary | ICD-10-CM | POA: Insufficient documentation

## 2022-01-07 DIAGNOSIS — Z20822 Contact with and (suspected) exposure to covid-19: Secondary | ICD-10-CM

## 2022-01-07 DIAGNOSIS — Z1152 Encounter for screening for COVID-19: Secondary | ICD-10-CM | POA: Insufficient documentation

## 2022-01-07 NOTE — ED Triage Notes (Signed)
Patient was exposed to [place] a few days ago and is concerned about having it. Also would like a week supply of prednisone for joint inflammation. Denies any symptoms.

## 2022-01-07 NOTE — ED Provider Notes (Signed)
[facility], [facility] Emergency Department  ED Primary Provider Note  History of Present Illness   Chief Complaint   Patient presents with    Exposure To Coronavirus     Steven Parrish is a 51 y.o. male who had concerns including Exposure To Coronavirus.  Arrival: The patient arrived by Car complaining of being in contact with someone for several days who tested positive for COVID.  Patient wants to be tested today.  Patient has aches and pains all over but he states he always has house.  He denies any fever chills.  Denies any sore throat or earache.  No history of coughing.  No chest pain or shortness breath.  Patient states always having nasal congestion from sinusitis.  Patient denies any headache.    HPI  Review of Systems   Review of Systems   Constitutional:  Negative for chills and fever.   HENT:  Positive for congestion, postnasal drip and rhinorrhea. Negative for ear pain and sore throat.    Eyes:  Negative for pain and visual disturbance.   Respiratory:  Negative for cough and shortness of breath.    Cardiovascular:  Negative for chest pain and palpitations.   Gastrointestinal:  Negative for abdominal pain and vomiting.   Genitourinary:  Negative for dysuria and hematuria.   Musculoskeletal:  Positive for arthralgias and myalgias. Negative for back pain.   Skin:  Negative for color change and rash.   Neurological:  Negative for seizures and syncope.   All other systems reviewed and are negative.     Historical Data   History Reviewed This Encounter: Medical History  Surgical History  Family History  Social History    Physical Exam   ED Triage Vitals [01/07/22 0839]   BP (Non-Invasive) (!) 144/96   Heart Rate 95   Respiratory Rate 19   Temperature 36.7 C (98 F)   SpO2 99 %   Weight    Height      Physical Exam  Vitals and nursing note reviewed.   Constitutional:       General: He is not in acute distress.     Appearance: He is well-developed and normal weight.    HENT:      Head: Normocephalic and atraumatic.      Right Ear: External ear normal.      Left Ear: External ear normal.      Nose: Congestion and rhinorrhea present.      Mouth/Throat:      Mouth: Mucous membranes are moist.   Eyes:      Extraocular Movements: Extraocular movements intact.      Conjunctiva/sclera: Conjunctivae normal.      Pupils: Pupils are equal, round, and reactive to light.   Cardiovascular:      Rate and Rhythm: Normal rate and regular rhythm.      Pulses: Normal pulses.      Heart sounds: Normal heart sounds. No murmur heard.  Pulmonary:      Effort: Pulmonary effort is normal. No respiratory distress.      Breath sounds: Normal breath sounds.   Abdominal:      General: Bowel sounds are normal.      Palpations: Abdomen is soft.      Tenderness: There is no abdominal tenderness.   Musculoskeletal:         General: No swelling. Normal range of motion.      Cervical back: Normal range of motion and neck supple.  Skin:     General: Skin is warm and dry.      Capillary Refill: Capillary refill takes less than 2 seconds.   Neurological:      General: No focal deficit present.      Mental Status: He is alert and oriented to person, place, and time.   Psychiatric:         Mood and Affect: Mood normal.         Behavior: Behavior normal.         Thought Content: Thought content normal.         Judgment: Judgment normal.       Patient Data   Labs Ordered/Reviewed - No data to display  No orders to display     Medical Decision Making        Medical Decision Making  Patient is 51 year old white complaining having exposure to someone for several days who now tested positive for COVID.  Patient denies any symptoms of COVID other than aches and pains all over.  He states he is had those for years.  He denies any sore throat or earache.  No headache.  Denies any chest pain or shortness of breath.  Patient denies any nausea vomiting or diarrhea.  Patient wants for Plex done.  Once his for Plex comes back he  will be treated appropriately and discharged home.  Patient will follow up with his PMD in the next 3-4 days.                Clinical Impression   Upper respiratory tract infection, unspecified type (Primary)   Exposure to COVID-19 virus       Disposition: Eloped               Clinical Impression   Upper respiratory tract infection, unspecified type (Primary)   Exposure to COVID-19 virus       Discharge Medication List as of 01/07/2022 11:08 AM

## 2022-01-07 NOTE — ED Nurses Note (Signed)
No patient in room checked bathrooms , waiting room  pt eloped

## 2022-01-28 ENCOUNTER — Encounter (HOSPITAL_BASED_OUTPATIENT_CLINIC_OR_DEPARTMENT_OTHER): Payer: Self-pay

## 2022-01-28 ENCOUNTER — Emergency Department
Admission: EM | Admit: 2022-01-28 | Discharge: 2022-01-28 | Discharge status: Against Medical Advice | Payer: 59 | Attending: FAMILY PRACTICE | Admitting: FAMILY PRACTICE

## 2022-01-28 ENCOUNTER — Other Ambulatory Visit: Payer: Self-pay

## 2022-01-28 ENCOUNTER — Emergency Department (HOSPITAL_BASED_OUTPATIENT_CLINIC_OR_DEPARTMENT_OTHER): Payer: 59

## 2022-01-28 DIAGNOSIS — L03012 Cellulitis of left finger: Secondary | ICD-10-CM | POA: Insufficient documentation

## 2022-01-28 DIAGNOSIS — R112 Nausea with vomiting, unspecified: Secondary | ICD-10-CM | POA: Insufficient documentation

## 2022-01-28 DIAGNOSIS — R9431 Abnormal electrocardiogram [ECG] [EKG]: Secondary | ICD-10-CM | POA: Insufficient documentation

## 2022-01-28 DIAGNOSIS — R197 Diarrhea, unspecified: Secondary | ICD-10-CM | POA: Insufficient documentation

## 2022-01-28 DIAGNOSIS — K7030 Alcoholic cirrhosis of liver without ascites: Secondary | ICD-10-CM | POA: Insufficient documentation

## 2022-01-28 DIAGNOSIS — F1011 Alcohol abuse, in remission: Secondary | ICD-10-CM | POA: Insufficient documentation

## 2022-01-28 DIAGNOSIS — F1991 Other psychoactive substance use, unspecified, in remission: Secondary | ICD-10-CM | POA: Insufficient documentation

## 2022-01-28 DIAGNOSIS — K219 Gastro-esophageal reflux disease without esophagitis: Secondary | ICD-10-CM | POA: Insufficient documentation

## 2022-01-28 DIAGNOSIS — Z5329 Procedure and treatment not carried out because of patient's decision for other reasons: Secondary | ICD-10-CM | POA: Insufficient documentation

## 2022-01-28 DIAGNOSIS — Z8616 Personal history of COVID-19: Secondary | ICD-10-CM | POA: Insufficient documentation

## 2022-01-28 LAB — POC BLOOD GLUCOSE (RESULTS): GLUCOSE, POC: 95 mg/dl (ref 50–500)

## 2022-01-28 LAB — TROPONIN-I: TROPONIN I: <2 ng/L (ref <20)

## 2022-01-28 LAB — LIPASE: LIPASE: 25 U/L (ref 11–82)

## 2022-01-28 LAB — COMPREHENSIVE METABOLIC PANEL, NON-FASTING
ALBUMIN/GLOBULIN RATIO: 0.9 (ref 0.8–1.4)
ALBUMIN: 4.1 g/dL (ref 3.4–5.0)
ALKALINE PHOSPHATASE: 110 U/L (ref 46–116)
ALT (SGPT): 43 U/L (ref <=78)
ANION GAP: 11 mmol/L (ref 4–13)
AST (SGOT): 26 U/L (ref 15–37)
BILIRUBIN TOTAL: 0.7 mg/dL (ref 0.2–1.0)
BUN/CREA RATIO: 14
BUN: 13 mg/dL (ref 7–18)
CALCIUM, CORRECTED: 9.7 mg/dL
CALCIUM: 9.8 mg/dL (ref 8.5–10.1)
CHLORIDE: 100 mmol/L (ref 98–107)
CO2 TOTAL: 27 mmol/L (ref 21–32)
CREATININE: 0.92 mg/dL (ref 0.70–1.30)
ESTIMATED GFR: 101 mL/min/{1.73_m2} (ref >59)
GLOBULIN: 4.6
GLUCOSE: 107 mg/dL — ABNORMAL HIGH (ref 74–106)
OSMOLALITY, CALCULATED: 276 mOsm/kg (ref 270–290)
POTASSIUM: 4.2 mmol/L (ref 3.5–5.1)
PROTEIN TOTAL: 8.7 g/dL — ABNORMAL HIGH (ref 6.4–8.2)
SODIUM: 138 mmol/L (ref 136–145)

## 2022-01-28 LAB — CBC WITH DIFF
BASOPHIL #: 0.02 10*3/uL (ref 0.00–0.30)
BASOPHIL %: 0 % (ref 0–3)
EOSINOPHIL #: 0.28 10*3/uL (ref 0.00–0.80)
EOSINOPHIL %: 2 % (ref 0–7)
HCT: 47.4 % (ref 42.0–51.0)
HGB: 15.8 g/dL (ref 13.5–18.0)
LYMPHOCYTE #: 1.3 10*3/uL (ref 1.10–5.00)
LYMPHOCYTE %: 10 % — ABNORMAL LOW (ref 25–45)
MCH: 31.6 pg (ref 27.0–32.0)
MCHC: 33.2 g/dL (ref 32.0–36.0)
MCV: 95 fL (ref 78.0–99.0)
MONOCYTE #: 0.66 10*3/uL (ref 0.00–1.30)
MONOCYTE %: 5 % (ref 0–12)
MPV: 7.7 fL (ref 7.4–10.4)
NEUTROPHIL #: 10.13 10*3/uL — ABNORMAL HIGH (ref 1.80–8.40)
NEUTROPHIL %: 82 % — ABNORMAL HIGH (ref 40–76)
PLATELETS: 306 10*3/uL (ref 140–440)
RBC: 4.99 10*6/uL (ref 4.20–6.00)
RDW: 16.1 % — ABNORMAL HIGH (ref 11.6–14.8)
WBC: 12.4 10*3/uL — ABNORMAL HIGH (ref 4.0–10.5)

## 2022-01-28 LAB — LACTIC ACID LEVEL W/ REFLEX FOR LEVEL >2.0: LACTIC ACID: 0.5 mmol/L (ref 0.4–2.0)

## 2022-01-28 MED ORDER — CEPHALEXIN 500 MG CAPSULE
500.0000 mg | ORAL_CAPSULE | Freq: Three times a day (TID) | ORAL | 0 refills | Status: AC
Start: 2022-01-28 — End: 2022-02-07

## 2022-01-28 MED ORDER — ONDANSETRON HCL (PF) 4 MG/2 ML INJECTION SOLUTION
4.0000 mg | INTRAMUSCULAR | Status: AC
Start: 2022-01-28 — End: 2022-01-28
  Administered 2022-01-28: 4 mg via INTRAVENOUS

## 2022-01-28 MED ORDER — LACTATED RINGERS IV BOLUS
2000.0000 mL | INJECTION | Status: AC
Start: 2022-01-28 — End: 2022-01-28
  Administered 2022-01-28: 2000 mL via INTRAVENOUS
  Administered 2022-01-28: 0 mL via INTRAVENOUS

## 2022-01-28 MED ORDER — ONDANSETRON 4 MG DISINTEGRATING TABLET
4.0000 mg | ORAL_TABLET | ORAL | Status: DC
Start: 2022-01-28 — End: 2022-01-28

## 2022-01-28 MED ORDER — ONDANSETRON HCL (PF) 4 MG/2 ML INJECTION SOLUTION
INTRAMUSCULAR | Status: AC
Start: 2022-01-28 — End: 2022-01-28
  Filled 2022-01-28: qty 2

## 2022-01-28 NOTE — ED Nurses Note (Signed)
Upon entering room patient states"I just got a phone call and I have to leave". Patient kept repeating this statement. Dr. Riki Sheer made aware. AMA papers signed. Ambulated off unit without problem.

## 2022-01-28 NOTE — ED Provider Notes (Signed)
[facility], [facility] Emergency Department  ED Primary Provider Note  History of Present Illness   Chief Complaint   Patient presents with    Vomiting    Diarrhea     Steven Parrish is a 51 y.o. male who had concerns including Vomiting and Diarrhea.  Arrival: The patient arrived by Car    This 51 year old male patient presents emergency department with onset of nausea with diarrhea about 1:00 a.m. this morning, progressed to vomiting by 5 or 6 this morning, which has continued until presentation here.  He has not been around anybody that is ill that he was aware of, he was still passing gas, along with the diarrhea and recurrent vomiting.  Patient does have history of GERD, esophageal varices, alcoholic cirrhosis, prior alcohol and drug abuse history.    Patient is [place] vaccinated, has had COVID once.          History Reviewed This Encounter:  Patient's past medical, surgical, social history reviewed noted.    Physical Exam   ED Triage Vitals [01/28/22 0946]   BP (Non-Invasive) (!) 143/96   Heart Rate 96   Respiratory Rate 14   Temperature 36.1 C (97 F)   SpO2 97 %   Weight 82.1 kg (181 lb)   Height 1.803 m (5' 11")     Physical Exam  General:  Appears ill but nontoxic   Ears: TMs clear throat for traction   Nasal:  Mild injection, thick white mucus, patent bilaterally   Oral:  Pink and moist oropharynx: Mild injection without PND, exudate, petechiae   Neck:  Supple, no adenopathy, trachea is in midline.    Lungs: Clear symmetrical good aeration   Heart: Regular rate rhythm S1-S2 without murmur gallop   Abdomen:  Soft normal bowel sounds, nontender.  No guarding, rebound, or referred pain.    Extremities:  Moving symmetrically   Skin:  No suspicious rashes lesions or edema   Neurological:  No focal deficits.      Patient Data     Labs Ordered/Reviewed   COMPREHENSIVE METABOLIC PANEL, NON-FASTING - Abnormal; Notable for the following components:       Result Value     GLUCOSE 107 (*)     PROTEIN TOTAL 8.7 (*)     All other components within normal limits    Narrative:     Estimated Glomerular Filtration Rate (eGFR) is calculated using the CKD-EPI (2021) equation, intended for patients 33 years of age and older. If gender is not documented or "unknown", there will be no eGFR calculation.   CBC WITH DIFF - Abnormal; Notable for the following components:    WBC 12.4 (*)     RDW 16.1 (*)     NEUTROPHIL % 82 (*)     LYMPHOCYTE % 10 (*)     NEUTROPHIL # 10.13 (*)     All other components within normal limits   LACTIC ACID LEVEL W/ REFLEX FOR LEVEL >2.0 - Normal   TROPONIN-I - Normal    Narrative:     Values received on females ranging between 12-15 ng/L MUST include the next serial troponin to review changes in the delta differences as the reference range for the Access II chemistry analyzer is lower than the established reference range.     POC BLOOD GLUCOSE (RESULTS) - Normal   CBC/DIFF    Narrative:     The following orders were created for panel order CBC/DIFF.  Procedure  Abnormality         Status                     ---------                               -----------         ------                     CBC WITH XLKG[[id number]]                Abnormal            Final result                 Please view results for these tests on the individual orders.   LIPASE     No orders to display     Medical Decision Making        Medical Decision Making  Moderate complexity straightforward, differential includes gastroenteritis, COVID, flu or other viral illness.    Amount and/or Complexity of Data Reviewed  Labs: ordered.  ECG/medicine tests: ordered.    Risk  Prescription drug management.             Medications Administered in the ED   LR bolus infusion 2,000 mL (2,000 mL Intravenous New Bag/New Syringe 01/28/22 1035)   ondansetron (ZOFRAN) 2 mg/mL injection (4 mg Intravenous Given 01/28/22 1036)     Clinical Impression   Nausea vomiting and diarrhea (Primary)    Paronychia of left ring finger       Disposition: AMA      .Marland KitchenBretta Bang, DO

## 2022-01-28 NOTE — ED Nurses Note (Signed)
Patient reports nausea is gone at this time. Resting quietly in bed with eyes open and respiration even and non labored. No problems noted at this time.

## 2022-01-28 NOTE — ED Triage Notes (Signed)
Nausea and vomiting started midnight, diarrhea and vomiting TNTC

## 2022-01-31 DIAGNOSIS — R112 Nausea with vomiting, unspecified: Secondary | ICD-10-CM

## 2022-01-31 LAB — ECG 12 LEAD
Atrial Rate: 100 {beats}/min
Calculated P Axis: 71 degrees
Calculated R Axis: 99 degrees
Calculated T Axis: -12 degrees
PR Interval: 142 ms
QRS Duration: 82 ms
QT Interval: 344 ms
QTC Calculation: 443 ms
Ventricular rate: 100 {beats}/min

## 2022-03-16 ENCOUNTER — Encounter (HOSPITAL_BASED_OUTPATIENT_CLINIC_OR_DEPARTMENT_OTHER): Payer: Self-pay

## 2022-03-16 ENCOUNTER — Emergency Department
Admission: EM | Admit: 2022-03-16 | Discharge: 2022-03-16 | Discharge status: Against Medical Advice | Payer: Non-veteran care

## 2022-03-16 ENCOUNTER — Other Ambulatory Visit: Payer: Self-pay

## 2022-03-16 DIAGNOSIS — Z5321 Procedure and treatment not carried out due to patient leaving prior to being seen by health care provider: Secondary | ICD-10-CM | POA: Insufficient documentation

## 2022-03-16 NOTE — ED Triage Notes (Addendum)
Patient c/o "abrasion" to the inside of right nare and frequent nosebleeds x2 weeks. No bleeding noted at time of triage. Patient states he is having problems "losing blood" but will not say where he is losing blood from. States he has "a band on to show that he needs blood transfusions if something happens". Patient also states he is not having pain currently because "I self-medicated with an opiate before coming here"

## 2022-03-16 NOTE — ED Nurses Note (Signed)
Patient remains unable to be found, by this nurse or by provider. Patient eloped.

## 2022-03-16 NOTE — ED Nurses Note (Signed)
Patient unable to be found by this nurse or provider. Not in patient room or in bathroom. Will attempt to relocate patient shortly.Marland Kitchen

## 2022-03-17 ENCOUNTER — Emergency Department
Admission: EM | Admit: 2022-03-17 | Discharge: 2022-03-17 | Discharge status: Against Medical Advice | Payer: Non-veteran care | Attending: FAMILY PRACTICE | Admitting: FAMILY PRACTICE

## 2022-03-17 ENCOUNTER — Encounter (HOSPITAL_BASED_OUTPATIENT_CLINIC_OR_DEPARTMENT_OTHER): Payer: Self-pay

## 2022-03-17 ENCOUNTER — Emergency Department (HOSPITAL_BASED_OUTPATIENT_CLINIC_OR_DEPARTMENT_OTHER): Payer: 59

## 2022-03-17 DIAGNOSIS — K7030 Alcoholic cirrhosis of liver without ascites: Secondary | ICD-10-CM

## 2022-03-17 DIAGNOSIS — Z5321 Procedure and treatment not carried out due to patient leaving prior to being seen by health care provider: Secondary | ICD-10-CM

## 2022-03-17 MED ORDER — SODIUM CHLORIDE 0.9 % (FLUSH) INJECTION SYRINGE
10.0000 mL | INJECTION | INTRAMUSCULAR | Status: DC
Start: 2022-03-17 — End: 2022-03-17

## 2022-03-17 MED ORDER — PANTOPRAZOLE 40 MG INTRAVENOUS SOLUTION
40.0000 mg | INTRAVENOUS | Status: DC
Start: 2022-03-17 — End: 2022-03-17

## 2022-03-17 NOTE — ED Nurses Note (Signed)
[facility], [facility] Emergency Department  Peer Recovery Coach Assessment    Initial Evaluation  Referred by:: Nurse     How many times in the last 12 months have you been to the ED?: 5  Have you ever served or are you currently serving in the [place]?: No             Substance Use History  Patient current substance use status: Patient stated he currently uses dilaudid intravenously and drinks alcohol daily. Patient stated he drinks 2 pints of liquor a day and uses different amounts of dilaudid,    Prior treatment history?: Yes  Prior treatment program types: Inpatient  Number of prior treatment admissions: 1    Currently enrolled in substance use program?: No    Within the last 30 days, what substances has the patient used?: Opiates, Alcohol  Age of first opiate use: 52 years old  When was the patient's last use of opiates/heroin?: 0-24 hours (This morning.)  Patient's age at first substance use?: [age]  Drug route of administration: Injected, Oral    Has the patient ever had sustained abstinence?: Yes  When did sustained abstinence occur?: 5 or more years ago  Length of sustained abstinence.: 1-2 years    Does patient want opiate use treatment?: No (Patient refused any type of treatment option.)         Family, Social, Home & Safety History  Marital Status: Single            Need to improve relationships with family?: Yes    Social network: Substance using peers, Non-substance using peers/friends/other    Current living situation: Independent  Any help needed with the following?: None  Contact phone number for the patient: [phone]  Emergency contact name and phone number: None Listed    Has the patient had any legal issues within the past 30 days?: None         Employment  Current employment status: Disabled    Landscape architect?: No  Needs assistance with job search?: No    Engagement  Readiness ruler: 1  Summary of assessment priority areas: Substance abuse  treatment    Brief Intervention  Discussed plan to reduce/quit substance use?: Yes  Discussed willingness to enter treatment?: Yes  Indicated patient's stage of change:: 1 - Precontemplation    Patient seen by Peer Recovery Coach and is a candidate for buprenorphine administration in the ED. Patient needs assessment for bup treatment.: No    Plan  Was the patient referred to treatment?: No    Was patient referred to physician for Buprenorphine Assessment in the ED?: No    Did patient receive Narcan in the ED?: No         Follow-up           Need for additional follow-up?: Yes  Additional comments: Patient stated he has been using diladid intravenously and drinking 2 pints of liquor dails since around the age of 52. Patient stated he did go 10 years without drinking. Patient stated he has been to inpatient treatment in [place], [place] just a few months ago. Patient stated he has been on both suboxone and methadone and they do not help him what so ever.  Patient stated that he uses dilaudid and rinks alcohol for pain managment. Patient stated that he has broken discs and liver failure. Patient was very adamant about not wanting treatment. Patient refused any treatment option provided to him and siad  there is no point because he has done it before and will not do it again. We discussed safe practices and harm reduction.    Waverly Ferrari, Peer Recovery Coach 03/17/2022 13:33

## 2022-03-17 NOTE — ED Triage Notes (Signed)
Patient states he has a laceration in his nose.  States also has lower back pain and thinks it is due to liver failure.  Also has rectal bleeding, SOB

## 2022-03-17 NOTE — ED Nurses Note (Addendum)
Nurse in with Dr. Riki Sheer. Pt not in room. Bathrooms and waiting rooms checked.

## 2022-03-17 NOTE — ED Nurses Note (Signed)
Pt has not returned to room. Dr. Riki Sheer informed.

## 2022-03-17 NOTE — ED Attending Note (Addendum)
This 52 year old male patient presented to the emergency department with complaints of back pain, rectal bleeding that he thought was hemorrhoids, and nasal laceration.  He came through triage, went to the room, I have into the room multiple times and he was not there, told staff he was going to the bathroom.  He eloped after triage, no examination was performed.  I did go in the room briefly, he was talking with the recovery coach, after she left the room I went in and he was gone.    No HPI, physical exam, impression disposition is available, labs were ordered not even drawn.

## 2022-03-17 NOTE — ED Nurses Note (Signed)
Pt awake alert c/o's generalized abdominal pain, lower back pain, and rectal bleeding. States, "I have hemorrhoids that are bleeding. I have to go to the bathroom to get cleaned up often. My stomach is sore all over and my lower back hurts. I have liver failure. I just recently started drinking again. That Librium, just doesn't help. I need Gabapentin. That's what helps me. " Abdomen soft tender throughout.

## 2022-03-18 ENCOUNTER — Emergency Department
Admission: EM | Admit: 2022-03-18 | Discharge: 2022-03-18 | Payer: MEDICAID | Attending: FAMILY PRACTICE | Admitting: FAMILY PRACTICE

## 2022-03-18 ENCOUNTER — Encounter (HOSPITAL_BASED_OUTPATIENT_CLINIC_OR_DEPARTMENT_OTHER): Payer: Self-pay

## 2022-03-18 ENCOUNTER — Emergency Department (HOSPITAL_BASED_OUTPATIENT_CLINIC_OR_DEPARTMENT_OTHER): Payer: 59

## 2022-03-18 ENCOUNTER — Other Ambulatory Visit: Payer: Self-pay

## 2022-03-18 DIAGNOSIS — F1010 Alcohol abuse, uncomplicated: Secondary | ICD-10-CM | POA: Insufficient documentation

## 2022-03-18 DIAGNOSIS — K625 Hemorrhage of anus and rectum: Secondary | ICD-10-CM | POA: Insufficient documentation

## 2022-03-18 DIAGNOSIS — I8510 Secondary esophageal varices without bleeding: Secondary | ICD-10-CM | POA: Insufficient documentation

## 2022-03-18 DIAGNOSIS — G8929 Other chronic pain: Secondary | ICD-10-CM | POA: Insufficient documentation

## 2022-03-18 DIAGNOSIS — F17210 Nicotine dependence, cigarettes, uncomplicated: Secondary | ICD-10-CM | POA: Insufficient documentation

## 2022-03-18 DIAGNOSIS — K649 Unspecified hemorrhoids: Secondary | ICD-10-CM | POA: Insufficient documentation

## 2022-03-18 DIAGNOSIS — I8500 Esophageal varices without bleeding: Secondary | ICD-10-CM

## 2022-03-18 DIAGNOSIS — R040 Epistaxis: Secondary | ICD-10-CM | POA: Insufficient documentation

## 2022-03-18 DIAGNOSIS — K7030 Alcoholic cirrhosis of liver without ascites: Secondary | ICD-10-CM | POA: Insufficient documentation

## 2022-03-18 MED ORDER — GABAPENTIN 400 MG CAPSULE
400.0000 mg | ORAL_CAPSULE | Freq: Two times a day (BID) | ORAL | 0 refills | Status: DC
Start: 2022-03-18 — End: 2022-03-30

## 2022-03-18 NOTE — ED Provider Notes (Signed)
[facility], [facility] Emergency Department  ED Primary Provider Note  History of Present Illness   Chief Complaint   Patient presents with    Rectal Bleeding     [name] is a 52 y.o. male who had concerns including Rectal Bleeding.  Arrival: The patient arrived by Car    This 52 year old male patient presents here today with complaints of nosebleed and rectal bleeding.  Says he was recurrent nosebleeds, and he has hemorrhoids with bleeding, and a history of alcohol abuse with cirrhosis and esophageal varices.  He does smoke a pack and a half to 2 packs of cigarettes per day, rubs snuff, and drinks a pt or more vodka per day.  He said he had about a half a pt of vodka about 3:00 a.m. this morning at home.  Says he has a lot of difficulty trying to stop alcohol and tobacco, but he was trying.  Does see the aqua clinic at the [facility] in [place].  He does continue drink, had half a pt of vodka this morning but states he does drink bottle or more a day usually.  Medical history is significant for esophageal varices, alcoholic cirrhosis, hypertension, IV drug use, chronic lumbar pain and ulcers.  Past surgical history includes EGD, nasal surgery, cholecystectomy and sinus surgery.  He does smoke 1-1/2-2 packs cigarettes per day, continues to drink alcohol, does not drink marijuana and denies illicit drug use currently.  Patient states that he was difficulty coming off alcohol and that Valium or Librium does not help, he says he feels much better and has less withdrawal symptomatology with gabapentin, however his last couple of visits to the aqua clinic he was not given any gabapentin.    Patient is not COVID or influenza vaccinated.      History Reviewed This Encounter:  Patient's past medical, surgical, social history reviewed noted, and discussed with the patient.    Physical Exam   ED Triage Vitals [03/18/22 1303]   BP (Non-Invasive)  (!) 141/103   Heart Rate 96   Respiratory Rate 18   Temperature 36.3 C (97.4 F)   SpO2 93 %   Weight 87.1 kg (192 lb)   Height 1.803 m (5\' 11" )     Physical Exam  General:  Patient is anxious, no acute distress and appears nontoxic   Eyes: Sclera is white  Ears: TMs clear without retraction  Nasal:  Boggy pale mucosa, watery discharge, patent bilaterally   Oral: Pink and moist   Pharynx:  Pink and moist without PND, exudate, petechiae.    Neck: Supple, trachea is in midline.  Lungs:  Clear symmetrical good aeration   Heart: Regular rate rhythm without murmur.    Patient Data   Labs Ordered/Reviewed - No data to display  No orders to display     Medical Decision Making        Medical Decision Making  Moderate complexity straightforward, patient is here for nosebleed only today and does not want to remainder of the examination.  No blood or bleeding noted and no irritation nasal passages noted.  No posterior pharyngeal blood was seen either.  Patient will be discharged to home, with his chronic pain issues and cirrhosis with alcohol withdrawal he will be given 5 days of gabapentin, which will get him through the weekend and he was calling the [facility] today for follow-up hopefully Friday but likely will be Monday or Tuesday.  Risk  Prescription drug management.                Clinical Impression   Frequent nosebleeds (Primary)   Alcoholic cirrhosis of liver without ascites (CMS HCC)   Hemorrhoids, unspecified hemorrhoid type   Esophageal varices (CMS HCC)       Disposition: Discharged       .Marland KitchenBretta Bang, DO

## 2022-03-18 NOTE — ED Nurses Note (Signed)
[facility], [facility] Emergency Department  Peer Recovery Coach Assessment    Initial Evaluation  Referred by:: Nurse  Location of Evaluation: Emergency Department  How many times in the last 12 months have you been to the ED?: 6 or more  Have you ever served or are you currently serving in the [place]?: No             Substance Use History  Patient current substance use status: Patient stated that he currently uses dilaudid IV and drinks alcohol daily.    Prior treatment history?: Yes    Currently enrolled in substance use program?: No  Is treatment program medication assisted?: No    Within the last 30 days, what substances has the patient used?: Alcohol, Opiates  Age of first opiate use: 52 years old  When was the patient's last use of opiates/heroin?: 0-24 hours  Patient's age at first substance use?: [age]  Drug route of administration: Oral, Injected    Has the patient ever had sustained abstinence?: Yes  When did sustained abstinence occur?: 5 or more years ago  Length of sustained abstinence.: 1-2 years    Does patient want opiate use treatment?: No         Family, Social, Home & Safety History  Marital Status: Single            Need to improve relationships with family?: Yes    Social network: Substance using peers    Current living situation: Independent  Any help needed with the following?: None  Contact phone number for the patient: [phone]       Has the patient had any legal issues within the past 30 days?: None         Employment  Current employment status: Disabled    Landscape architect?: No  Needs assistance with job search?: No    Engagement  Readiness ruler: 1  Summary of assessment priority areas: comments: Patient states that he does not want substance help    Brief Intervention  Discussed plan to reduce/quit substance use?: Yes  Discussed willingness to enter treatment?: Yes  Indicated patient's stage of change:: 1 - Precontemplation    Patient  seen by Peer Recovery Coach and is a candidate for buprenorphine administration in the ED. Patient needs assessment for bup treatment.: No    Plan  Was the patient referred to treatment?: No    Was patient referred to physician for Buprenorphine Assessment in the ED?: No    Did patient receive Narcan in the ED?: No         Follow-up  Patient admitted for treatment?: No        Need for additional follow-up?: Yes  Additional comments: Patient stated he has been using diladid intravenously and drinking 2 pints of liquor daily. Patient stated that he uses dilaudid and rinks alcohol for pain managment. Patient stated that he has broken discs and liver failure. Patient was very adamant about not wanting treatment. Patient refuses treatment options. Discussed safe drinking guidelines and harm reduction.    Callie Fielding, Peer Recovery Coach 03/18/2022 13:45

## 2022-03-18 NOTE — ED Nurses Note (Signed)
Found pt outside and had him come back in to go over the discharge papers. Verbalized understanding of instructions. Left ambulatory.

## 2022-03-18 NOTE — ED Triage Notes (Signed)
Pt complains of rectal bleeding x 2 weeks, also has had intermittent nose bleeds x 1 week.

## 2022-03-18 NOTE — Discharge Instructions (Signed)
With your alcohol withdrawal I did give you 10 gabapentin pills, to take twice daily, through Monday.  You have your colonoscopy tomorrow, I would also call or visit the [facility] to get an appointment Friday or Monday for evaluation for alcohol withdrawal, chronic pain, and possibly for the use of gabapentin instead of benzodiazepines for alcohol withdrawal.    Continue other home medicines as prescribed.  With your tobacco abuse being smoking and oral tobacco, I will stop oral tobacco before ease and trying to stop smoking.    You can use tucks or other over-the-counter hemorrhoidal medications as directed.

## 2022-03-23 ENCOUNTER — Encounter (HOSPITAL_BASED_OUTPATIENT_CLINIC_OR_DEPARTMENT_OTHER): Payer: Self-pay

## 2022-03-23 ENCOUNTER — Other Ambulatory Visit: Payer: Self-pay

## 2022-03-23 ENCOUNTER — Emergency Department
Admission: EM | Admit: 2022-03-23 | Discharge: 2022-03-24 | Disposition: A | Discharge status: Home / Self Care | Payer: Non-veteran care | Attending: Emergency Medicine | Admitting: Emergency Medicine

## 2022-03-23 DIAGNOSIS — F1920 Other psychoactive substance dependence, uncomplicated: Secondary | ICD-10-CM

## 2022-03-23 DIAGNOSIS — F10129 Alcohol abuse with intoxication, unspecified: Secondary | ICD-10-CM | POA: Insufficient documentation

## 2022-03-23 DIAGNOSIS — F10929 Alcohol use, unspecified with intoxication, unspecified: Secondary | ICD-10-CM

## 2022-03-23 DIAGNOSIS — F1010 Alcohol abuse, uncomplicated: Secondary | ICD-10-CM

## 2022-03-23 DIAGNOSIS — F1910 Other psychoactive substance abuse, uncomplicated: Secondary | ICD-10-CM | POA: Insufficient documentation

## 2022-03-23 DIAGNOSIS — F17200 Nicotine dependence, unspecified, uncomplicated: Secondary | ICD-10-CM | POA: Insufficient documentation

## 2022-03-23 DIAGNOSIS — Y904 Blood alcohol level of 80-99 mg/100 ml: Secondary | ICD-10-CM | POA: Insufficient documentation

## 2022-03-23 LAB — CBC WITH DIFF
BASOPHIL #: 0.01 10*3/uL (ref 0.00–0.30)
BASOPHIL %: 0 % (ref 0–3)
EOSINOPHIL #: 0.1 10*3/uL (ref 0.00–0.80)
EOSINOPHIL %: 2 % (ref 0–7)
HCT: 39.9 % — ABNORMAL LOW (ref 42.0–51.0)
HGB: 13.5 g/dL (ref 13.5–18.0)
LYMPHOCYTE #: 2.58 10*3/uL (ref 1.10–5.00)
LYMPHOCYTE %: 54 % — ABNORMAL HIGH (ref 25–45)
MCH: 33.2 pg — ABNORMAL HIGH (ref 27.0–32.0)
MCHC: 33.9 g/dL (ref 32.0–36.0)
MCV: 97.8 fL (ref 78.0–99.0)
MONOCYTE #: 0.34 10*3/uL (ref 0.00–1.30)
MONOCYTE %: 7 % (ref 0–12)
MPV: 7.7 fL (ref 7.4–10.4)
NEUTROPHIL #: 1.78 10*3/uL — ABNORMAL LOW (ref 1.80–8.40)
NEUTROPHIL %: 37 % — ABNORMAL LOW (ref 40–76)
PLATELETS: 280 10*3/uL (ref 140–440)
RBC: 4.08 10*6/uL — ABNORMAL LOW (ref 4.20–6.00)
RDW: 16.7 % — ABNORMAL HIGH (ref 11.6–14.8)
WBC: 4.8 10*3/uL (ref 4.0–10.5)

## 2022-03-23 LAB — COMPREHENSIVE METABOLIC PANEL, NON-FASTING
ALBUMIN/GLOBULIN RATIO: 0.9 (ref 0.8–1.4)
ALBUMIN: 4.3 g/dL (ref 3.4–5.0)
ALKALINE PHOSPHATASE: 371 U/L — ABNORMAL HIGH (ref 46–116)
ALT (SGPT): 216 U/L — ABNORMAL HIGH (ref <=78)
ANION GAP: 12 mmol/L (ref 4–13)
AST (SGOT): 270 U/L — ABNORMAL HIGH (ref 15–37)
BILIRUBIN TOTAL: 0.7 mg/dL (ref 0.2–1.0)
BUN/CREA RATIO: 14
BUN: 10 mg/dL (ref 7–18)
CALCIUM, CORRECTED: 8.9 mg/dL
CALCIUM: 9.1 mg/dL (ref 8.5–10.1)
CHLORIDE: 103 mmol/L (ref 98–107)
CO2 TOTAL: 28 mmol/L (ref 21–32)
CREATININE: 0.7 mg/dL (ref 0.70–1.30)
ESTIMATED GFR: 112 mL/min/{1.73_m2} (ref >59)
GLOBULIN: 4.7
GLUCOSE: 98 mg/dL (ref 74–106)
OSMOLALITY, CALCULATED: 284 mOsm/kg (ref 270–290)
POTASSIUM: 3.6 mmol/L (ref 3.5–5.1)
PROTEIN TOTAL: 9 g/dL — ABNORMAL HIGH (ref 6.4–8.2)
SODIUM: 143 mmol/L (ref 136–145)

## 2022-03-23 LAB — URINALYSIS, MICROSCOPIC: BACTERIA: NEGATIVE /hpf

## 2022-03-23 LAB — URINALYSIS, MACRO/MICRO
BILIRUBIN: NEGATIVE mg/dL
GLUCOSE: NEGATIVE mg/dL
KETONES: NEGATIVE mg/dL
LEUKOCYTES: NEGATIVE WBCs/uL
NITRITE: NEGATIVE
PH: 6 (ref 4.6–8.0)
PROTEIN: NEGATIVE mg/dL
SPECIFIC GRAVITY: <=1.005 (ref 1.003–1.035)
UROBILINOGEN: 0.2 mg/dL (ref 0.2–1.0)

## 2022-03-23 LAB — DRUG SCREEN, NO CONFIRMATION, URINE
AMPHETAMINES URINE: NEGATIVE
BARBITURATES URINE: NEGATIVE
BENZODIAZEPINES URINE: NEGATIVE
CANNABINOIDS URINE: NEGATIVE
COCAINE METABOLITES URINE: NEGATIVE
METHADONE URINE: NEGATIVE
OPIATES URINE: POSITIVE — AB
PCP URINE: NEGATIVE

## 2022-03-23 LAB — ACETAMINOPHEN LEVEL: ACETAMINOPHEN LEVEL: 0

## 2022-03-23 LAB — ETHANOL, SERUM/PLASMA: ETHANOL: 416 mg/dL (ref <=3)

## 2022-03-23 LAB — THYROID STIMULATING HORMONE (SENSITIVE TSH): TSH: 0.652 u[IU]/mL (ref 0.358–3.740)

## 2022-03-23 LAB — SALICYLATE ACID LEVEL: SALICYLATE LEVEL: 1 mg/dL — ABNORMAL LOW (ref 3–20)

## 2022-03-23 MED ORDER — LORAZEPAM 2 MG/ML INJECTION SYRINGE
INJECTION | INTRAMUSCULAR | Status: AC
Start: 2022-03-23 — End: 2022-03-23
  Filled 2022-03-23: qty 1

## 2022-03-23 MED ORDER — LORAZEPAM 2 MG/ML INJECTION WRAPPER
2.0000 mg | INTRAMUSCULAR | Status: AC
Start: 2022-03-23 — End: 2022-03-23
  Administered 2022-03-23: 2 mg via INTRAMUSCULAR

## 2022-03-23 MED ORDER — ZIPRASIDONE 20 MG/ML (FINAL CONCENTRATION) INTRAMUSCULAR SOLUTION
INTRAMUSCULAR | Status: AC
Start: 2022-03-23 — End: 2022-03-23
  Filled 2022-03-23: qty 1

## 2022-03-23 MED ORDER — ZIPRASIDONE 20 MG/ML (FINAL CONCENTRATION) INTRAMUSCULAR SOLUTION
20.0000 mg | Freq: Once | INTRAMUSCULAR | Status: AC
Start: 2022-03-23 — End: 2022-03-23
  Administered 2022-03-23: 20 mg via INTRAMUSCULAR

## 2022-03-23 NOTE — ED Nurses Note (Signed)
PT still very drowsy. Assisted to sit up in bed and use urinal and the pt repositioned in bed on side with HOB slightly inclined.  Resp even and unlabored.

## 2022-03-23 NOTE — ED Provider Notes (Signed)
[facility], [facility] Emergency Department  ED Primary Provider Note  History of Present Illness   Chief Complaint   Patient presents with    Alcohol intoxication     Steven Parrish is a 52 y.o. male who had concerns including Alcohol intoxication.  Arrival: The patient arrived by Ambulance complaining of going through withdrawal even though he drank 3 pt of hard liquor just prior to coming in as well as doing 24 mg of Dilaudid.  Patient states he does that every day.  Patient states he does opiates every day.  Patient denies any chest pain or shortness of breath.  Patient denies any nausea vomiting or diarrhea.  Patient denies any shakes presently.  Patient is screaming and yelling at other patients in the ER.  Patient is very belligerent cursing frequently.  Patient does have alcoholic liver disease as well as cirrhosis.  Patient is an IV drug abuser.  He also has hypertension and peptic ulcers in the past.  Patient admits to smoking daily, using alcohol daily, and using drugs.  Patient denies any depression or suicidal ideations.  Patient denies any homicidal thoughts.     HPI  Review of Systems   Review of Systems   Constitutional:  Positive for activity change and appetite change. Negative for chills and fever.   HENT:  Negative for ear pain and sore throat.    Eyes:  Negative for pain and visual disturbance.   Respiratory:  Negative for cough and shortness of breath.    Cardiovascular:  Negative for chest pain and palpitations.   Gastrointestinal:  Negative for abdominal pain and vomiting.   Genitourinary:  Negative for dysuria and hematuria.   Musculoskeletal:  Negative for arthralgias and back pain.   Skin:  Negative for color change and rash.   Neurological:  Negative for seizures and syncope.   Psychiatric/Behavioral:  Positive for agitation, behavioral problems, confusion and decreased concentration.    All other systems reviewed and are negative.     Historical Data    History Reviewed This Encounter:     Physical Exam   ED Triage Vitals   BP (Non-Invasive) 03/23/22 1930 (!) 146/107   Pulse --    Respiratory Rate 03/23/22 1756 18   Temp --    SpO2 --    Weight 03/23/22 1756 90.7 kg (200 lb)   Height 03/23/22 1756 1.803 m (5' 11"$ )     Physical Exam  Vitals and nursing note reviewed.   Constitutional:       General: He is not in acute distress.     Appearance: He is well-developed. He is obese.   HENT:      Head: Normocephalic and atraumatic.      Right Ear: External ear normal.      Left Ear: External ear normal.      Nose: Nose normal.      Mouth/Throat:      Mouth: Mucous membranes are dry.   Eyes:      Extraocular Movements: Extraocular movements intact.      Conjunctiva/sclera: Conjunctivae normal.      Pupils: Pupils are equal, round, and reactive to light.   Cardiovascular:      Rate and Rhythm: Normal rate and regular rhythm.      Pulses: Normal pulses.      Heart sounds: Normal heart sounds. No murmur heard.  Pulmonary:      Effort: Pulmonary effort is normal. No respiratory distress.  Breath sounds: Normal breath sounds.   Abdominal:      General: Bowel sounds are normal.      Palpations: Abdomen is soft.      Tenderness: There is no abdominal tenderness.   Musculoskeletal:         General: No swelling. Normal range of motion.      Cervical back: Normal range of motion and neck supple.   Skin:     General: Skin is warm and dry.      Capillary Refill: Capillary refill takes less than 2 seconds.   Neurological:      General: No focal deficit present.      Mental Status: He is alert. He is disoriented.      Comments: Patient is a OB strong.  Appears to be drunk.  Patient is straight screaming and cursing at staff.   Psychiatric:      Comments: A OB strong       Patient Data      Labs Ordered/Reviewed   COMPREHENSIVE METABOLIC PANEL, NON-FASTING - Abnormal; Notable for the following components:       Result Value    ALKALINE PHOSPHATASE 371 (*)     ALT (SGPT) 216 (*)      AST (SGOT) 270 (*)     PROTEIN TOTAL 9.0 (*)     All other components within normal limits    Narrative:     Estimated Glomerular Filtration Rate (eGFR) is calculated using the CKD-EPI (2021) equation, intended for patients 93 years of age and older. If gender is not documented or "unknown", there will be no eGFR calculation.   ETHANOL, SERUM/PLASMA - Abnormal; Notable for the following components:    ETHANOL 416 (*)     All other components within normal limits   SALICYLATE ACID LEVEL - Abnormal; Notable for the following components:    SALICYLATE LEVEL 1 (*)     All other components within normal limits   CBC WITH DIFF - Abnormal; Notable for the following components:    RBC 4.08 (*)     HCT 39.9 (*)     MCH 33.2 (*)     RDW 16.7 (*)     NEUTROPHIL % 37 (*)     LYMPHOCYTE % 54 (*)     NEUTROPHIL # 1.78 (*)     All other components within normal limits   ACETAMINOPHEN LEVEL - Normal   THYROID STIMULATING HORMONE (SENSITIVE TSH) - Normal   CBC/DIFF    Narrative:     The following orders were created for panel order CBC/DIFF.  Procedure                               Abnormality         Status                     ---------                               -----------         ------                     CBC WITH QD:[id number]                Abnormal            Final result  Please view results for these tests on the individual orders.   URINALYSIS WITH REFLEX MICROSCOPIC AND CULTURE IF POSITIVE    Narrative:     The following orders were created for panel order URINALYSIS WITH REFLEX MICROSCOPIC AND CULTURE IF POSITIVE.  Procedure                               Abnormality         Status                     ---------                               -----------         ------                     URINALYSIS, MACRO/MICRO[[id number]]                                                       Please view results for these tests on the individual orders.   DRUG SCREEN, NO CONFIRMATION, URINE   URINALYSIS, MACRO/MICRO      No orders to display     Medical Decision Making         Medical Decision Making  Patient is 52 year old white male complaining drinking too much today, 3 pt of liquor, and taking Dilaudid 24 mg.  Patient states he normally takes that daily.  Patient has been doing IV drugs for 24 years.  Patient is also bragging about how much she can drank.  He denies any depression or suicidal thoughts.  He denies any homicidal thoughts.  Patient will have labs done and then be offered sobriety.  Patient will be offered a rehab facility when he is completely sober.      Care of the patient was handed off to Dr. Diesel Lina at 7:30 p.m.Marland Kitchen  Please refer to their ED course note for further details on patient's ED course, further imaging, ultimately patient disposition.    Amount and/or Complexity of Data Reviewed  Labs: ordered.    Risk  Prescription drug management.  Parenteral controlled substances.             Medications Administered in the ED   ziprasidone (GEODON) IM injection (20 mg IntraMUSCULAR Given 03/23/22 1815)   LORazepam (ATIVAN) 2 mg/mL injection (2 mg IntraMUSCULAR Given 03/23/22 1814)     Clinical Impression   Alcohol intoxication (CMS HCC) (Primary)   Alcohol abuse   Drug abuse and dependence (CMS HCC)       Disposition: Data Unavailable                 Clinical Impression   Alcohol intoxication (CMS HCC) (Primary)   Alcohol abuse   Drug abuse and dependence (CMS [facility])       Current Discharge Medication List

## 2022-03-23 NOTE — ED Nurses Note (Signed)
PT starting to toss and turn in bed. Mumbling words, but half words difficult to understand due to slurring.

## 2022-03-23 NOTE — ED Nurses Note (Signed)
PT still sleeping in bed with resp even and unlabored.

## 2022-03-23 NOTE — ED Nurses Note (Signed)
PT states he had had t take Rocephin in past without any reaction.

## 2022-03-23 NOTE — ED Triage Notes (Signed)
Pt sent from his Mother's house where the police was there and he says he has been drinking 3 bottles of liquor. Pt cursing when he arrived

## 2022-03-23 NOTE — ED Nurses Note (Signed)
Patient is stating he is withdrawing. Questioned patient what he is withdrawing from he states dilaudid. Patient appears intoxicated with slurred speech. He is alert and oriented. Patient states he drank 3 pints of jack today and took a total of 15m of dilaudid. Patient repeatedly states he is withdrawing from dilaudid because he has been on it 22 years.

## 2022-03-23 NOTE — ED Attending Note (Signed)
Patient transferred to my service at 7:30 p.m. patient with alcohol abuse and presently intoxicated.  Earlier patient required medical sedation due to aggressive behavior and agitation.  Patient will be monitored overnight repeat alcohol in a.m.

## 2022-03-23 NOTE — ED Nurses Note (Signed)
Pts boots, jacket placed in bag and locker #8 at this time. Pt very sleepy, drowsy, since medicated on day shift from agitation. Placed on pulse ox and BP cuff and adjusted in bed. Pt opens eyes with physical/repeated verbal stimuli, but goes right back to sleep. Resp even and unlabored. Side rails upx2 and pt within view of nurses station.

## 2022-03-24 LAB — ETHANOL, SERUM/PLASMA: ETHANOL: 96 mg/dL — ABNORMAL HIGH (ref <=3)

## 2022-03-24 MED ORDER — ONDANSETRON 4 MG DISINTEGRATING TABLET
ORAL_TABLET | ORAL | Status: AC
Start: 2022-03-24 — End: 2022-03-24
  Filled 2022-03-24: qty 1

## 2022-03-24 MED ORDER — LORAZEPAM 1 MG TABLET
2.0000 mg | ORAL_TABLET | ORAL | Status: AC
Start: 2022-03-24 — End: 2022-03-24
  Administered 2022-03-24: 2 mg via ORAL

## 2022-03-24 MED ORDER — LORAZEPAM 1 MG TABLET
ORAL_TABLET | ORAL | Status: AC
Start: 2022-03-24 — End: 2022-03-24
  Filled 2022-03-24: qty 2

## 2022-03-24 MED ORDER — ONDANSETRON 4 MG DISINTEGRATING TABLET
4.0000 mg | ORAL_TABLET | ORAL | Status: AC
Start: 2022-03-24 — End: 2022-03-24
  Administered 2022-03-24: 4 mg via ORAL

## 2022-03-24 NOTE — ED Nurses Note (Signed)
PT assisted to bedside toilet and had moderate amount of stool (BM). Pt resting back in bed with resp even and unlabored.

## 2022-03-24 NOTE — ED Nurses Note (Signed)
PT sitting up and was able to drink po fluids and offered po solid intake and given meal tray and bottle water. Alert/o x4. Pt states he does not know if he wants to go to rehab or just try to go home and try himself.

## 2022-03-24 NOTE — Discharge Instructions (Signed)
Please follow-up as outpatient alcohol and drug rehabilitation

## 2022-03-24 NOTE — ED Nurses Note (Signed)
[facility], [facility] Emergency Department  Peer Recovery Coach Assessment    Initial Evaluation  Referred by:: Nurse  Location of Evaluation: Emergency Department  How many times in the last 12 months have you been to the ED?: 6 or more  Have you ever served or are you currently serving in the [employer]?: No             Substance Use History  Patient current substance use status: Patient states that he is currently using IV dilaudid and drinks alcohol daily.    Prior treatment history?: Yes    Currently enrolled in substance use program?: No    Within the last 30 days, what substances has the patient used?: Alcohol, Opiates  Age of first opiate use: 52 years old  When was the patient's last use of opiates/heroin?: 0-24 hours  Patient's age at first substance use?: 21-25  Drug route of administration: Injected, Oral    Has the patient ever had sustained abstinence?: Yes  When did sustained abstinence occur?: 5 or more years ago  Length of sustained abstinence.: 1-2 years    Does patient want opiate use treatment?: No         Family, Social, Home & Safety History  Marital Status: Single            Need to improve relationships with family?: Yes    Social network: Substance using peers    Current living situation: Independent  Any help needed with the following?: None  Contact phone number for the patient: [phone]       Has the patient had any legal issues within the past 30 days?: Other (Patient was sent to the ED by [facility].)         Employment  Current employment status: Disabled    Needs vocational training?: No  Needs assistance with job search?: No    Engagement  Readiness ruler: 2  Summary of assessment priority areas: Substance abuse treatment, Mental Health, Safety    Brief Intervention  Discussed plan to reduce/quit substance use?: Yes  Discussed willingness to enter treatment?: Yes  Indicated patient's stage of change:: 2 - Contemplation    Patient seen by Peer  Recovery Coach and is a candidate for buprenorphine administration in the ED. Patient needs assessment for bup treatment.: No    Plan  Was the patient referred to treatment?: No    Was patient referred to physician for Buprenorphine Assessment in the ED?: No    Did patient receive Narcan in the ED?: No    Plan: Additional Comments: Discussed harm reduction, long term effects of alcohol and safe drinking guideline.    Follow-up  Patient admitted for treatment?: No        Need for additional follow-up?: Yes  Additional comments: Patient stated he has been using IV diladid intravenously and drinking daily. Last use was last night (35m of Diladid and 2 or more bottles of JBarnabas Lister. Patient stated that he uses dilaudid and drinks alcohol for pain managment. Treatment options offered to patient and he refused stating that he could take care of it on his own. Discussed harm reduction, safe drinking guidelines and long term effects.    ACallie Fielding Peer Recovery Coach 03/24/2022 07:32

## 2022-03-24 NOTE — ED Nurses Note (Addendum)
Patient given written and verbal d/c instructions. All questions/concerns addressed. Patient is A&O x4, patient phoned a friend and has someone coming to pick him up. Patient denies wanting any rehab at this time. Patient informed to return with any concerns. Informed to stop drinking and stop using drugs. Patient verbalizes understanding of d/c instructions and has no further questions/concerns. Patient leaving ambulatory at this time.

## 2022-03-28 ENCOUNTER — Emergency Department (HOSPITAL_BASED_OUTPATIENT_CLINIC_OR_DEPARTMENT_OTHER): Payer: Non-veteran care

## 2022-03-28 ENCOUNTER — Emergency Department
Admission: EM | Admit: 2022-03-28 | Discharge: 2022-03-29 | Discharge status: Against Medical Advice | Payer: Non-veteran care | Attending: Emergency Medicine | Admitting: Emergency Medicine

## 2022-03-28 ENCOUNTER — Other Ambulatory Visit: Payer: Self-pay

## 2022-03-28 ENCOUNTER — Encounter (HOSPITAL_BASED_OUTPATIENT_CLINIC_OR_DEPARTMENT_OTHER): Payer: Self-pay

## 2022-03-28 DIAGNOSIS — I864 Gastric varices: Secondary | ICD-10-CM | POA: Insufficient documentation

## 2022-03-28 DIAGNOSIS — J9811 Atelectasis: Secondary | ICD-10-CM | POA: Insufficient documentation

## 2022-03-28 DIAGNOSIS — F1011 Alcohol abuse, in remission: Secondary | ICD-10-CM

## 2022-03-28 DIAGNOSIS — J9691 Respiratory failure, unspecified with hypoxia: Secondary | ICD-10-CM | POA: Insufficient documentation

## 2022-03-28 DIAGNOSIS — F1911 Other psychoactive substance abuse, in remission: Secondary | ICD-10-CM

## 2022-03-28 DIAGNOSIS — Z87891 Personal history of nicotine dependence: Secondary | ICD-10-CM | POA: Insufficient documentation

## 2022-03-28 DIAGNOSIS — R000 Tachycardia, unspecified: Secondary | ICD-10-CM

## 2022-03-28 DIAGNOSIS — J189 Pneumonia, unspecified organism: Secondary | ICD-10-CM | POA: Insufficient documentation

## 2022-03-28 DIAGNOSIS — T783XXA Angioneurotic edema, initial encounter: Secondary | ICD-10-CM

## 2022-03-28 LAB — CBC WITH DIFF
BASOPHIL #: 0.01 10*3/uL (ref 0.00–0.30)
BASOPHIL %: 0 % (ref 0–3)
EOSINOPHIL #: 0.17 10*3/uL (ref 0.00–0.80)
EOSINOPHIL %: 3 % (ref 0–7)
HCT: 38.7 % — ABNORMAL LOW (ref 42.0–51.0)
HGB: 12.8 g/dL — ABNORMAL LOW (ref 13.5–18.0)
LYMPHOCYTE #: 2.28 10*3/uL (ref 1.10–5.00)
LYMPHOCYTE %: 44 % (ref 25–45)
MCH: 32.8 pg — ABNORMAL HIGH (ref 27.0–32.0)
MCHC: 33.2 g/dL (ref 32.0–36.0)
MCV: 98.9 fL (ref 78.0–99.0)
MONOCYTE #: 0.48 10*3/uL (ref 0.00–1.30)
MONOCYTE %: 9 % (ref 0–12)
MPV: 7.8 fL (ref 7.4–10.4)
NEUTROPHIL #: 2.3 10*3/uL (ref 1.80–8.40)
NEUTROPHIL %: 44 % (ref 40–76)
PLATELETS: 184 10*3/uL (ref 140–440)
RBC: 3.91 10*6/uL — ABNORMAL LOW (ref 4.20–6.00)
RDW: 17.1 % — ABNORMAL HIGH (ref 11.6–14.8)
WBC: 5.2 10*3/uL (ref 4.0–10.5)

## 2022-03-28 LAB — COMPREHENSIVE METABOLIC PANEL, NON-FASTING
ALBUMIN/GLOBULIN RATIO: 0.9 (ref 0.8–1.4)
ALBUMIN: 3.9 g/dL (ref 3.4–5.0)
ALKALINE PHOSPHATASE: 365 U/L — ABNORMAL HIGH (ref 46–116)
ALT (SGPT): 157 U/L — ABNORMAL HIGH (ref <=78)
ANION GAP: 9 mmol/L (ref 4–13)
AST (SGOT): 206 U/L — ABNORMAL HIGH (ref 15–37)
BILIRUBIN TOTAL: 0.7 mg/dL (ref 0.2–1.0)
BUN/CREA RATIO: 13
BUN: 10 mg/dL (ref 7–18)
CALCIUM, CORRECTED: 9.2 mg/dL
CALCIUM: 9.1 mg/dL (ref 8.5–10.1)
CHLORIDE: 99 mmol/L (ref 98–107)
CO2 TOTAL: 30 mmol/L (ref 21–32)
CREATININE: 0.76 mg/dL (ref 0.70–1.30)
ESTIMATED GFR: 109 mL/min/{1.73_m2} (ref >59)
GLOBULIN: 4.3
GLUCOSE: 97 mg/dL (ref 74–106)
OSMOLALITY, CALCULATED: 275 mOsm/kg (ref 270–290)
POTASSIUM: 3.4 mmol/L — ABNORMAL LOW (ref 3.5–5.1)
PROTEIN TOTAL: 8.2 g/dL (ref 6.4–8.2)
SODIUM: 138 mmol/L (ref 136–145)

## 2022-03-28 LAB — ARTERIAL BLOOD GAS/LACTATE
%FIO2 (ARTERIAL): 21 %
BASE EXCESS (ARTERIAL): 3.6 mmol/L — ABNORMAL HIGH (ref <=2.0)
BICARBONATE (ARTERIAL): 30.4 mmol/L — ABNORMAL HIGH (ref 20.0–26.0)
CARBOXYHEMOGLOBIN: 9.5 % (ref <=1.5)
LACTATE: 1.5 mmol/L (ref <=2.0)
MET-HEMOGLOBIN: 0.3 % (ref <=2.0)
O2CT: 13.5 %
OXYHEMOGLOBIN: 73.5 % — CL (ref 88.0–100.0)
PCO2 (ARTERIAL): 55 mm/Hg — ABNORMAL HIGH (ref 35–45)
PH (ARTERIAL): 7.35 (ref 7.35–7.45)
PO2 (ARTERIAL): 47 mm/Hg — CL (ref 80–100)

## 2022-03-28 LAB — COVID-19, FLU A/B, RSV RAPID BY PCR
INFLUENZA VIRUS TYPE A: NOT DETECTED
INFLUENZA VIRUS TYPE B: NOT DETECTED
RESPIRATORY SYNCTIAL VIRUS (RSV): NOT DETECTED
SARS-CoV-2: NOT DETECTED

## 2022-03-28 LAB — LACTIC ACID LEVEL W/ REFLEX FOR LEVEL >2.0: LACTIC ACID: 1.8 mmol/L (ref 0.4–2.0)

## 2022-03-28 MED ORDER — IPRATROPIUM 0.5 MG-ALBUTEROL 3 MG (2.5 MG BASE)/3 ML NEBULIZATION SOLN
3.0000 mL | INHALATION_SOLUTION | RESPIRATORY_TRACT | Status: AC
Start: 2022-03-28 — End: 2022-03-28
  Administered 2022-03-28: 3 mL via RESPIRATORY_TRACT

## 2022-03-28 MED ORDER — ACETAMINOPHEN 325 MG TABLET
ORAL_TABLET | ORAL | Status: AC
Start: 2022-03-28 — End: 2022-03-28
  Filled 2022-03-28: qty 3

## 2022-03-28 MED ORDER — KETOROLAC 30 MG/ML (1 ML) INJECTION SOLUTION
INTRAMUSCULAR | Status: AC
Start: 2022-03-28 — End: 2022-03-28
  Filled 2022-03-28: qty 1

## 2022-03-28 MED ORDER — KETOROLAC 30 MG/ML (1 ML) INJECTION SOLUTION
15.0000 mg | INTRAMUSCULAR | Status: DC
Start: 2022-03-28 — End: 2022-03-29
  Administered 2022-03-28: 0 mg via INTRAMUSCULAR

## 2022-03-28 MED ORDER — NICOTINE 21 MG/24 HR DAILY TRANSDERMAL PATCH
MEDICATED_PATCH | TRANSDERMAL | Status: AC
Start: 2022-03-28 — End: 2022-03-28
  Filled 2022-03-28: qty 1

## 2022-03-28 MED ORDER — IPRATROPIUM 0.5 MG-ALBUTEROL 3 MG (2.5 MG BASE)/3 ML NEBULIZATION SOLN
INHALATION_SOLUTION | RESPIRATORY_TRACT | Status: AC
Start: 2022-03-28 — End: 2022-03-28
  Filled 2022-03-28: qty 3

## 2022-03-28 MED ORDER — DIPHENHYDRAMINE 25 MG CAPSULE
50.0000 mg | ORAL_CAPSULE | ORAL | Status: DC
Start: 2022-03-28 — End: 2022-03-29
  Administered 2022-03-28: 0 mg via ORAL

## 2022-03-28 MED ORDER — NICOTINE 21 MG/24 HR DAILY TRANSDERMAL PATCH
21.0000 mg | MEDICATED_PATCH | TRANSDERMAL | Status: DC
Start: 2022-03-29 — End: 2022-03-29
  Administered 2022-03-28: 21 mg via TRANSDERMAL

## 2022-03-28 MED ORDER — DIPHENHYDRAMINE 25 MG CAPSULE
ORAL_CAPSULE | ORAL | Status: AC
Start: 2022-03-28 — End: 2022-03-28
  Filled 2022-03-28: qty 2

## 2022-03-28 MED ORDER — ACETAMINOPHEN 325 MG TABLET
1000.0000 mg | ORAL_TABLET | ORAL | Status: DC
Start: 2022-03-28 — End: 2022-03-29
  Administered 2022-03-28: 0 mg via ORAL

## 2022-03-28 NOTE — ED Triage Notes (Signed)
Pt states that he woke up today and noticed he had a swollen bottom lip. States he has pain in the lip and jaw.

## 2022-03-28 NOTE — ED Nurses Note (Signed)
Patient refused all medications. States benadryl makes him anxious and Toradol and tylenol does nothing to help him. Provider notified.

## 2022-03-28 NOTE — ED Nurses Note (Signed)
Patient states "I feel like I am in withdrawals". Patient requested nicotine patch. Provider notified and patch applied. No other concerns at this time.

## 2022-03-28 NOTE — ED Provider Notes (Signed)
[facility], [facility] Emergency Department  ED Primary Provider Note  HPI:  Steven Parrish is a 52 y.o. male     Patient complains of lower lip swelling which has been ongoing for few hours.  He feels like he might have a dental abscess.  Endorses chills denies fevers feels like he is having difficulty breathing but no trouble swallowing no changes in voice.  Patient denies any new medications.  He abuses alcohol and opioid drugs.  He injects drugs.  Last use of either was today.Marland Kitchen  No prior history of endocarditis.  I did review most recent primary care note 2/15.  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  He was full code    ROS review and negative aside from stated in HPI.    Physical Exam:  ED Triage Vitals [03/28/22 2148]   BP (Non-Invasive) (!) 138/100   Heart Rate (!) 105   Respiratory Rate 20   Temperature 36.3 C (97.3 F)   SpO2 93 %   Weight 87.5 kg (193 lb)   Height 1.803 m (5' 11"$ )     No acute distress.  Patient awake alert oriented x3.  Agitated but redirectable.  Pupils 3 mm equal round reactive.  Extraocular movements are intact.  Oropharynx is clear.  He has largely a dentate.  Right side lower lip.  No fluctuance.  Phonating appropriately.  No swelling of the floor of the mouth or tongue  Mucous membranes moist.  Trachea midline.  Neck is supple.  Heart has regular rate and rhythm without significant murmurs rubs or gallops.  Lungs are clear to auscultation.  Abdomen soft nontender, nondistended.  Moving all extremities without difficulty.  No rash no edema.      Patient data:  Labs Ordered/Reviewed - No data to display  No orders to display       MDM:  ***      Data Unavailable  Clinical Impression   None     Medications Administered in the ED   ipratropium-albuterol 0.5 mg-3 mg(2.5 mg base)/3 mL Solution for Nebulization (has no administration in time range)        Current Discharge Medication List         CONTINUE these medications - NO CHANGES were made during your visit.        Details   gabapentin 400 mg Capsule  Commonly known as: NEURONTIN   400 mg, Oral, 2 TIMES DAILY  Qty: 10 Capsule  Refills: 0     Lisinopril 30 mg Tablet  Commonly known as: PRINIVIL   30 mg, Oral, DAILY  Qty: 30 Tablet  Refills: 0     omeprazole 20 mg Capsule, Delayed Release(E.C.)  Commonly known as: PRILOSEC   20 mg, Oral, DAILY  Refills: 0

## 2022-03-29 ENCOUNTER — Inpatient Hospital Stay
Admission: EM | Admit: 2022-03-29 | Discharge: 2022-03-30 | DRG: 193 | Disposition: A | Discharge status: Home / Self Care | Payer: 59 | Attending: HOSPITALIST | Admitting: HOSPITALIST

## 2022-03-29 ENCOUNTER — Encounter (HOSPITAL_BASED_OUTPATIENT_CLINIC_OR_DEPARTMENT_OTHER): Payer: Self-pay

## 2022-03-29 DIAGNOSIS — J440 Chronic obstructive pulmonary disease with acute lower respiratory infection: Secondary | ICD-10-CM | POA: Diagnosis present

## 2022-03-29 DIAGNOSIS — J159 Unspecified bacterial pneumonia: Principal | ICD-10-CM | POA: Diagnosis present

## 2022-03-29 DIAGNOSIS — J9601 Acute respiratory failure with hypoxia: Secondary | ICD-10-CM

## 2022-03-29 DIAGNOSIS — Z79899 Other long term (current) drug therapy: Secondary | ICD-10-CM

## 2022-03-29 DIAGNOSIS — F1910 Other psychoactive substance abuse, uncomplicated: Secondary | ICD-10-CM

## 2022-03-29 DIAGNOSIS — F1010 Alcohol abuse, uncomplicated: Secondary | ICD-10-CM | POA: Diagnosis present

## 2022-03-29 DIAGNOSIS — F1020 Alcohol dependence, uncomplicated: Secondary | ICD-10-CM

## 2022-03-29 DIAGNOSIS — J9691 Respiratory failure, unspecified with hypoxia: Secondary | ICD-10-CM

## 2022-03-29 DIAGNOSIS — F17210 Nicotine dependence, cigarettes, uncomplicated: Secondary | ICD-10-CM | POA: Diagnosis present

## 2022-03-29 DIAGNOSIS — Z7952 Long term (current) use of systemic steroids: Secondary | ICD-10-CM

## 2022-03-29 DIAGNOSIS — Z8711 Personal history of peptic ulcer disease: Secondary | ICD-10-CM

## 2022-03-29 DIAGNOSIS — F1110 Opioid abuse, uncomplicated: Secondary | ICD-10-CM | POA: Diagnosis present

## 2022-03-29 DIAGNOSIS — T783XXA Angioneurotic edema, initial encounter: Secondary | ICD-10-CM | POA: Diagnosis present

## 2022-03-29 DIAGNOSIS — J9622 Acute and chronic respiratory failure with hypercapnia: Secondary | ICD-10-CM | POA: Diagnosis present

## 2022-03-29 DIAGNOSIS — E876 Hypokalemia: Secondary | ICD-10-CM | POA: Diagnosis present

## 2022-03-29 DIAGNOSIS — K7030 Alcoholic cirrhosis of liver without ascites: Secondary | ICD-10-CM | POA: Diagnosis present

## 2022-03-29 DIAGNOSIS — J9621 Acute and chronic respiratory failure with hypoxia: Secondary | ICD-10-CM | POA: Diagnosis present

## 2022-03-29 DIAGNOSIS — J189 Pneumonia, unspecified organism: Secondary | ICD-10-CM

## 2022-03-29 DIAGNOSIS — F10239 Alcohol dependence with withdrawal, unspecified: Secondary | ICD-10-CM | POA: Diagnosis present

## 2022-03-29 DIAGNOSIS — I10 Essential (primary) hypertension: Secondary | ICD-10-CM | POA: Diagnosis present

## 2022-03-29 DIAGNOSIS — K219 Gastro-esophageal reflux disease without esophagitis: Secondary | ICD-10-CM | POA: Diagnosis present

## 2022-03-29 DIAGNOSIS — J9602 Acute respiratory failure with hypercapnia: Secondary | ICD-10-CM

## 2022-03-29 LAB — DRUG SCREEN, NO CONFIRMATION, URINE
AMPHETAMINES URINE: NEGATIVE
BARBITURATES URINE: NEGATIVE
BENZODIAZEPINES URINE: NEGATIVE
CANNABINOIDS URINE: NEGATIVE
COCAINE METABOLITES URINE: NEGATIVE
METHADONE URINE: NEGATIVE
OPIATES URINE: POSITIVE — AB
PCP URINE: NEGATIVE

## 2022-03-29 LAB — ARTERIAL BLOOD GAS/LACTATE
%FIO2 (ARTERIAL): 21 %
BASE EXCESS (ARTERIAL): 0.4 mmol/L (ref <=2.0)
BICARBONATE (ARTERIAL): 26.9 mmol/L — ABNORMAL HIGH (ref 20.0–26.0)
CARBOXYHEMOGLOBIN: 7.3 % — ABNORMAL HIGH (ref <=1.5)
LACTATE: 2 mmol/L (ref <=2.0)
MET-HEMOGLOBIN: 0.3 % (ref <=2.0)
O2CT: 13.7 %
OXYHEMOGLOBIN: 79.8 % — CL (ref 88.0–100.0)
PCO2 (ARTERIAL): 51 mm/Hg — ABNORMAL HIGH (ref 35–45)
PH (ARTERIAL): 7.34 — ABNORMAL LOW (ref 7.35–7.45)
PO2 (ARTERIAL): 55 mm/Hg — ABNORMAL LOW (ref 80–100)

## 2022-03-29 LAB — URINALYSIS, MACRO/MICRO
BILIRUBIN: NEGATIVE mg/dL
BLOOD: NEGATIVE mg/dL
GLUCOSE: NEGATIVE mg/dL
KETONES: NEGATIVE mg/dL
LEUKOCYTES: NEGATIVE WBCs/uL
NITRITE: NEGATIVE
PH: 6 (ref 4.6–8.0)
PROTEIN: NEGATIVE mg/dL
SPECIFIC GRAVITY: 1.01 (ref 1.003–1.035)
UROBILINOGEN: 0.2 mg/dL (ref 0.2–1.0)

## 2022-03-29 MED ORDER — TRAZODONE 50 MG TABLET
50.0000 mg | ORAL_TABLET | Freq: Every evening | ORAL | Status: DC | PRN
Start: 2022-03-29 — End: 2022-03-30
  Administered 2022-03-30: 50 mg via ORAL
  Filled 2022-03-29: qty 1

## 2022-03-29 MED ORDER — CEFTRIAXONE 1 GRAM SOLUTION FOR INJECTION
INTRAMUSCULAR | Status: AC
Start: 2022-03-29 — End: 2022-03-29
  Filled 2022-03-29: qty 10

## 2022-03-29 MED ORDER — ONDANSETRON HCL (PF) 4 MG/2 ML INJECTION SOLUTION
INTRAMUSCULAR | Status: AC
Start: 2022-03-29 — End: 2022-03-29
  Filled 2022-03-29: qty 2

## 2022-03-29 MED ORDER — NICOTINE 21 MG/24 HR DAILY TRANSDERMAL PATCH
21.0000 mg | MEDICATED_PATCH | TRANSDERMAL | Status: AC
Start: 2022-03-29 — End: 2022-03-30
  Administered 2022-03-29: 21 mg via TRANSDERMAL

## 2022-03-29 MED ORDER — ONDANSETRON HCL (PF) 4 MG/2 ML INJECTION SOLUTION
4.0000 mg | INTRAMUSCULAR | Status: AC
Start: 2022-03-29 — End: 2022-03-29
  Administered 2022-03-29: 4 mg via INTRAVENOUS

## 2022-03-29 MED ORDER — SODIUM CHLORIDE 0.9 % INTRAVENOUS PIGGYBACK
1.0000 g | INTRAVENOUS | Status: DC
Start: 2022-03-29 — End: 2022-03-29

## 2022-03-29 MED ORDER — ENOXAPARIN 40 MG/0.4 ML SUBCUTANEOUS SYRINGE
40.0000 mg | INJECTION | Freq: Every day | SUBCUTANEOUS | Status: DC
Start: 2022-03-29 — End: 2022-03-30
  Administered 2022-03-29 – 2022-03-30 (×2): 40 mg via SUBCUTANEOUS
  Filled 2022-03-29 (×2): qty 0.4

## 2022-03-29 MED ORDER — THIAMINE MONONITRATE (VITAMIN B1) 100 MG TABLET
100.0000 mg | ORAL_TABLET | Freq: Every day | ORAL | Status: DC
Start: 2022-03-29 — End: 2022-03-30
  Administered 2022-03-29 – 2022-03-30 (×2): 100 mg via ORAL
  Filled 2022-03-29 (×2): qty 1

## 2022-03-29 MED ORDER — LORAZEPAM 2 MG/ML INJECTION WRAPPER
1.0000 mg | INTRAMUSCULAR | Status: DC | PRN
Start: 2022-03-29 — End: 2022-03-30
  Administered 2022-03-29 – 2022-03-30 (×7): 1 mg via INTRAVENOUS
  Filled 2022-03-29 (×7): qty 1

## 2022-03-29 MED ORDER — METHYLPREDNISOLONE SOD SUCC 125 MG SOLUTION FOR INJECTION WRAPPER
125.0000 mg | INTRAVENOUS | Status: AC
Start: 2022-03-29 — End: 2022-03-29
  Administered 2022-03-29: 125 mg via INTRAMUSCULAR

## 2022-03-29 MED ORDER — ONDANSETRON HCL (PF) 4 MG/2 ML INJECTION SOLUTION
4.0000 mg | Freq: Three times a day (TID) | INTRAMUSCULAR | Status: DC | PRN
Start: 2022-03-29 — End: 2022-03-30
  Administered 2022-03-29: 4 mg via INTRAVENOUS
  Filled 2022-03-29: qty 2

## 2022-03-29 MED ORDER — CHLORDIAZEPOXIDE 25 MG CAPSULE
25.0000 mg | ORAL_CAPSULE | Freq: Three times a day (TID) | ORAL | Status: DC
Start: 2022-03-29 — End: 2022-03-30
  Administered 2022-03-29 – 2022-03-30 (×5): 25 mg via ORAL
  Filled 2022-03-29 (×4): qty 1

## 2022-03-29 MED ORDER — BUDESONIDE 0.5 MG/2 ML SUSPENSION FOR NEBULIZATION
0.5000 mg | INHALATION_SUSPENSION | Freq: Two times a day (BID) | RESPIRATORY_TRACT | Status: DC
Start: 2022-03-29 — End: 2022-03-30
  Administered 2022-03-29 – 2022-03-30 (×3): 0.5 mg via RESPIRATORY_TRACT
  Filled 2022-03-29 (×2): qty 2

## 2022-03-29 MED ORDER — SODIUM CHLORIDE 0.9 % INTRAVENOUS SOLUTION
500.0000 mg | INTRAVENOUS | Status: DC
Start: 2022-03-30 — End: 2022-03-29

## 2022-03-29 MED ORDER — NITROGLYCERIN 0.4 MG SUBLINGUAL TABLET
0.4000 mg | SUBLINGUAL_TABLET | SUBLINGUAL | Status: DC | PRN
Start: 2022-03-29 — End: 2022-03-30

## 2022-03-29 MED ORDER — LISINOPRIL 5 MG TABLET
30.0000 mg | ORAL_TABLET | Freq: Every day | ORAL | Status: DC
Start: 2022-03-29 — End: 2022-03-30
  Administered 2022-03-29: 0 mg via ORAL
  Administered 2022-03-30: 30 mg via ORAL
  Filled 2022-03-29: qty 2

## 2022-03-29 MED ORDER — NALOXONE 4 MG/ACTUATION NASAL SPRAY
1.0000 | NASAL | 2 refills | Status: AC | PRN
Start: 2022-03-29 — End: ?

## 2022-03-29 MED ORDER — SODIUM CHLORIDE 0.9 % INTRAVENOUS SOLUTION
INTRAVENOUS | Status: DC
Start: 2022-03-29 — End: 2022-03-30

## 2022-03-29 MED ORDER — NICOTINE 21 MG/24 HR DAILY TRANSDERMAL PATCH
MEDICATED_PATCH | TRANSDERMAL | Status: AC
Start: 2022-03-29 — End: 2022-03-29
  Filled 2022-03-29: qty 1

## 2022-03-29 MED ORDER — SODIUM CHLORIDE 0.9 % INTRAVENOUS PIGGYBACK
2.0000 g | Freq: Every day | INTRAVENOUS | Status: DC
Start: 2022-03-29 — End: 2022-03-30
  Administered 2022-03-29: 2 g via INTRAVENOUS
  Administered 2022-03-29: 0 g via INTRAVENOUS
  Administered 2022-03-30: 2 g via INTRAVENOUS
  Administered 2022-03-30: 0 g via INTRAVENOUS
  Filled 2022-03-29 (×2): qty 20

## 2022-03-29 MED ORDER — POLYETHYLENE GLYCOL 3350 17 GRAM ORAL POWDER PACKET
17.0000 g | Freq: Every day | ORAL | Status: DC | PRN
Start: 2022-03-29 — End: 2022-03-30

## 2022-03-29 MED ORDER — METHYLPREDNISOLONE SOD SUCC 125 MG SOLUTION FOR INJECTION WRAPPER
INTRAVENOUS | Status: AC
Start: 2022-03-29 — End: 2022-03-29
  Filled 2022-03-29: qty 2

## 2022-03-29 MED ORDER — LIDOCAINE HCL 10 MG/ML (1 %) INJECTION SOLUTION
1.0000 g | Freq: Once | INTRAMUSCULAR | Status: AC
Start: 2022-03-29 — End: 2022-03-29
  Administered 2022-03-29: 1 g via INTRAMUSCULAR

## 2022-03-29 MED ORDER — METHYLPREDNISOLONE SOD SUCC 125 MG SOLUTION FOR INJECTION WRAPPER
125.0000 mg | INTRAVENOUS | Status: DC
Start: 2022-03-29 — End: 2022-03-29

## 2022-03-29 MED ORDER — GUAIFENESIN 100 MG/5 ML ORAL LIQUID
200.0000 mg | ORAL | Status: DC | PRN
Start: 2022-03-29 — End: 2022-03-30

## 2022-03-29 MED ORDER — MAGNESIUM HYDROXIDE 400 MG/5 ML ORAL SUSPENSION
30.0000 mL | Freq: Every evening | ORAL | Status: DC | PRN
Start: 2022-03-29 — End: 2022-03-30

## 2022-03-29 MED ORDER — SODIUM CHLORIDE 0.9 % INTRAVENOUS SOLUTION
500.0000 mg | INTRAVENOUS | Status: AC
Start: 2022-03-29 — End: 2022-03-29
  Administered 2022-03-29: 0 mg via INTRAVENOUS
  Administered 2022-03-29: 500 mg via INTRAVENOUS
  Filled 2022-03-29: qty 5

## 2022-03-29 MED ORDER — SODIUM CHLORIDE 0.9 % INTRAVENOUS SOLUTION
INTRAVENOUS | Status: DC
Start: 2022-03-29 — End: 2022-03-29
  Administered 2022-03-29: 0 mL via INTRAVENOUS

## 2022-03-29 MED ORDER — FOLIC ACID 1 MG TABLET
1.0000 mg | ORAL_TABLET | Freq: Every day | ORAL | Status: DC
Start: 2022-03-29 — End: 2022-03-30
  Administered 2022-03-29 – 2022-03-30 (×2): 1 mg via ORAL
  Filled 2022-03-29 (×2): qty 1

## 2022-03-29 MED ORDER — DIPHENHYDRAMINE 50 MG/ML INJECTION SOLUTION
25.0000 mg | Freq: Four times a day (QID) | INTRAMUSCULAR | Status: DC | PRN
Start: 2022-03-29 — End: 2022-03-30
  Administered 2022-03-30: 25 mg via INTRAVENOUS
  Filled 2022-03-29: qty 1

## 2022-03-29 MED ORDER — PANTOPRAZOLE 40 MG TABLET,DELAYED RELEASE
40.0000 mg | DELAYED_RELEASE_TABLET | Freq: Every day | ORAL | Status: DC
Start: 2022-03-29 — End: 2022-03-30
  Administered 2022-03-29 – 2022-03-30 (×2): 40 mg via ORAL
  Filled 2022-03-29 (×2): qty 1

## 2022-03-29 MED ORDER — PYRIDOXINE (VITAMIN B6) 50 MG TABLET
100.0000 mg | ORAL_TABLET | Freq: Every day | ORAL | Status: DC
Start: 2022-03-29 — End: 2022-03-30
  Administered 2022-03-29 – 2022-03-30 (×2): 100 mg via ORAL
  Filled 2022-03-29 (×2): qty 2

## 2022-03-29 MED ORDER — AMOXICILLIN 500 MG CAPSULE
1000.0000 mg | ORAL_CAPSULE | Freq: Three times a day (TID) | ORAL | 0 refills | Status: AC
Start: 2022-03-29 — End: 2022-04-08

## 2022-03-29 MED ORDER — ACETAMINOPHEN 325 MG TABLET
650.0000 mg | ORAL_TABLET | ORAL | Status: DC | PRN
Start: 2022-03-29 — End: 2022-03-30

## 2022-03-29 MED ORDER — IPRATROPIUM 0.5 MG-ALBUTEROL 3 MG (2.5 MG BASE)/3 ML NEBULIZATION SOLN
3.0000 mL | INHALATION_SOLUTION | RESPIRATORY_TRACT | Status: DC | PRN
Start: 2022-03-29 — End: 2022-03-30

## 2022-03-29 MED ORDER — PREDNISONE 50 MG TABLET
50.0000 mg | ORAL_TABLET | Freq: Every day | ORAL | 0 refills | Status: AC
Start: 2022-03-29 — End: 2022-04-03

## 2022-03-29 MED ORDER — AZITHROMYCIN 500 MG INTRAVENOUS SOLUTION
INTRAVENOUS | Status: AC
Start: 2022-03-29 — End: 2022-03-29
  Filled 2022-03-29: qty 5

## 2022-03-29 MED ORDER — SODIUM CHLORIDE 0.9 % INTRAVENOUS SOLUTION
500.0000 mg | Freq: Every day | INTRAVENOUS | Status: DC
Start: 2022-03-30 — End: 2022-03-30
  Administered 2022-03-30: 500 mg via INTRAVENOUS
  Administered 2022-03-30: 0 mg via INTRAVENOUS
  Filled 2022-03-29: qty 5

## 2022-03-29 MED ORDER — ALUMINUM-MAG HYDROXIDE-SIMETHICONE 200 MG-200 MG-20 MG/5 ML ORAL SUSP
30.0000 mL | ORAL | Status: DC | PRN
Start: 2022-03-29 — End: 2022-03-30

## 2022-03-29 MED ORDER — GUAIFENESIN ER 600 MG TABLET, EXTENDED RELEASE 12 HR
600.0000 mg | EXTENDED_RELEASE_TABLET | Freq: Two times a day (BID) | ORAL | Status: DC
Start: 2022-03-29 — End: 2022-03-30
  Administered 2022-03-29 – 2022-03-30 (×3): 600 mg via ORAL
  Filled 2022-03-29 (×3): qty 1

## 2022-03-29 MED ORDER — HYDRALAZINE 20 MG/ML INJECTION SOLUTION
10.0000 mg | INTRAMUSCULAR | Status: DC | PRN
Start: 2022-03-29 — End: 2022-03-30
  Administered 2022-03-30: 0 mg via INTRAVENOUS
  Filled 2022-03-29: qty 1

## 2022-03-29 MED ORDER — OXYCODONE 5 MG TABLET
5.0000 mg | ORAL_TABLET | ORAL | Status: DC | PRN
Start: 2022-03-29 — End: 2022-03-30
  Administered 2022-03-30: 5 mg via ORAL
  Filled 2022-03-29: qty 1

## 2022-03-29 MED ORDER — CHLORDIAZEPOXIDE 25 MG CAPSULE
ORAL_CAPSULE | ORAL | Status: AC
Start: 2022-03-29 — End: 2022-03-29
  Filled 2022-03-29: qty 1

## 2022-03-29 MED ORDER — MORPHINE 2 MG/ML INJECTION WRAPPER
2.0000 mg | INJECTION | INTRAMUSCULAR | Status: DC | PRN
Start: 2022-03-29 — End: 2022-03-30
  Administered 2022-03-29 – 2022-03-30 (×3): 2 mg via INTRAVENOUS
  Filled 2022-03-29 (×3): qty 1

## 2022-03-29 NOTE — H&P (Signed)
[facility]  History and Physical    Date of Service:  03/29/2022  Steven Parrish, Steven Parrish, 52 y.o. male  Encounter Start Date:  03/29/2022  Inpatient Admission Date: 03/29/2022  Date of Birth:  September 24, 1970  PCP: Ella Jubilee, DO       Chief Complaint:  Increased SOB    HPI: Steven Parrish is a 52 y.o., White male who presents to [facility] from [facility] with complaints of increased shortness of breath.  Patient states he has been having increased shortness of breath for the last 3-4 days.  Patient states he woke up yesterday his mouth was swollen.  ED provider did state the patient had some angio edema had improved.  Patient denies any injury.  Patient does not wear any home O2.  Patient states he does abuse opioids as well as alcohol.  Patient states he gets Dilaudid off of the street.  Patient states he drinks 1 pt or more of alcohol on a daily basis.  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  Patient had imaging studies in the ED that was suggestive of pneumonia.  Patient denies any other complaints at this time.  Patient will be admitted to hospitalist services for further evaluation.    Past Medical History:    Past Medical History:   Diagnosis Date    Alcoholic liver disease (CMS HCC)     Cirrhosis of liver (CMS HCC)     Esophageal and gastric varices (CMS HCC)      Esophageal hernia     Essential hypertension     IV drug abuse (CMS HCC)     Peptic ulcer              Medications Prior to Admission       Prescriptions    amoxicillin (AMOXIL) 500 mg Oral Capsule    Take 2 Capsules (1,000 mg total) by mouth Three times a day for 10 days    gabapentin (NEURONTIN) 400 mg Oral Capsule    Take 1 Capsule (400 mg total) by mouth Twice daily for 5 days    lisinopriL (PRINIVIL) 30 mg Oral Tablet    Take 1 Tablet (30 mg total) by mouth Once a day    Patient not taking:  Reported on 03/29/2022    naloxone (NARCAN) 4 mg per spray nasal spray    1 Spray by  INTRANASAL route Every 2 minutes as needed for actual or suspected opioid overdose. Call 911 if used.    omeprazole (PRILOSEC) 20 mg Oral Capsule, Delayed Release(E.C.)    Take 1 Capsule (20 mg total) by mouth Once a day    predniSONE (DELTASONE) 50 mg Oral Tablet    Take 1 Tablet (50 mg total) by mouth Once a day for 5 days          Allergies   Allergen Reactions    Chocolate [Cocoa]     Sulfa (Sulfonamides)  Other Adverse Reaction (Add comment)     Can't move    Triple Antibiotic [Neomy-Bacit-Polymyx-Pramoxine]        Past Surgical History:  Past Surgical History:   Procedure Laterality Date    HX CHOLECYSTECTOMY      NOSE SURGERY      SINUS SURGERY             Family History:  Family Medical History:    None            Social  History:  Social History     Tobacco Use    Smoking status: Every Day     Current packs/day: 1.00     Types: Cigarettes    Smokeless tobacco: Former     Types: Snuff   Vaping Use    Vaping status: Never Used   Substance Use Topics    Alcohol use: Yes     Comment: 5-6 pints per day    Drug use: Yes     Types: Opioid     Comment: dilaudid        Review of Systems:  All systems are reviewed and are negative except those mentioned in the HPI portion    Examination:  BP 97/70   Pulse 89   Temp 36.5 C (97.7 F)   Resp 15   Ht 1.803 m (5' 10.98")   Wt 87.5 kg (192 lb 14.4 oz)   SpO2 94%   BMI 26.92 kg/m         General: Patient is alert and oriented to person, place and time.    HEENT: Pupils are of round shape, equal in size, and reactive to light bilaterally. Oral mucous membranes are moist.    Heart: S1 and S2 are present. No appreciable murmur.    Lungs: Breath sounds are appreciated at all posterior lung fields, no appreciable crackles, wheezes, or rhonchi.    Gastrointestinal: Bowel sounds are appreciated at all 4 quadrants. Abdomen is soft, not appreciably distended, non-tender to palpation at all quadrants.    Extremities: Radial pulses are 3/4 bilaterally, dorsalis pedis pulses  are 3/4 bilaterally. Capillary refill is less than 3 seconds at distal digits bilaterally. No appreciable edema of the lower extremities.    Genitourinary: No appreciable suprapubic tenderness.    Neurologic: Follows commands appropriately. No appreciable facial droop. No appreciable focal weakness of the bilateral upper or lower extremities.    Skin: Grossly intact at observable areas.    Labs:    Lab Results Today:    Results for orders placed or performed during the hospital encounter of 03/29/22 (from the past 24 hour(s))   ARTERIAL BLOOD GAS/LACTATE   Result Value Ref Range    PH (ARTERIAL) 7.34 (L) 7.35 - 7.45    PCO2 (ARTERIAL) 51 (H) 35 - 45 mm/Hg    BICARBONATE (ARTERIAL) 26.9 (H) 20.0 - 26.0 mmol/L    BASE EXCESS (ARTERIAL) 0.4 -2.0 - 2.0 mmol/L    MET-HEMOGLOBIN 0.3 <=2.0 %    LACTATE 2.0 <=2.0 mmol/L    CARBOXYHEMOGLOBIN 7.3 (H) <=1.5 %    O2CT 13.7 %    %FIO2 (ARTERIAL) 21 %    PO2 (ARTERIAL) 55 (L) 80 - 100 mm/Hg    OXYHEMOGLOBIN 79.8 (LL) 88.0 - 100.0 %    ALLEN TEST yes     DRAW SITE lradial    Results for orders placed or performed during the hospital encounter of 03/28/22 (from the past 24 hour(s))   COVID-19, FLU A/B, RSV RAPID BY PCR   Result Value Ref Range    SARS-CoV-2 Not Detected Not Detected    INFLUENZA VIRUS TYPE A Not Detected Not Detected    INFLUENZA VIRUS TYPE B Not Detected Not Detected    RESPIRATORY SYNCTIAL VIRUS (RSV) Not Detected Not Detected   LACTIC ACID LEVEL W/ REFLEX FOR LEVEL >2.0   Result Value Ref Range    LACTIC ACID 1.8 0.4 - 2.0 mmol/L   COMPREHENSIVE METABOLIC PANEL, NON-FASTING   Result Value Ref Range  SODIUM 138 136 - 145 mmol/L    POTASSIUM 3.4 (L) 3.5 - 5.1 mmol/L    CHLORIDE 99 98 - 107 mmol/L    CO2 TOTAL 30 21 - 32 mmol/L    ANION GAP 9 4 - 13 mmol/L    BUN 10 7 - 18 mg/dL    CREATININE 0.76 0.70 - 1.30 mg/dL    BUN/CREA RATIO 13     ESTIMATED GFR 109 >59 mL/min/1.13m2    ALBUMIN 3.9 3.4 - 5.0 g/dL    CALCIUM 9.1 8.5 - 10.1 mg/dL    GLUCOSE 97 74 - 106  mg/dL    ALKALINE PHOSPHATASE 365 (H) 46 - 116 U/L    ALT (SGPT) 157 (H) <=78 U/L    AST (SGOT) 206 (H) 15 - 37 U/L    BILIRUBIN TOTAL 0.7 0.2 - 1.0 mg/dL    PROTEIN TOTAL 8.2 6.4 - 8.2 g/dL    ALBUMIN/GLOBULIN RATIO 0.9 0.8 - 1.4    OSMOLALITY, CALCULATED 275 270 - 290 mOsm/kg    CALCIUM, CORRECTED 9.2 mg/dL    GLOBULIN 4.3    CBC WITH DIFF   Result Value Ref Range    WBC 5.2 4.0 - 10.5 x10^3/uL    RBC 3.91 (L) 4.20 - 6.00 x10^6/uL    HGB 12.8 (L) 13.5 - 18.0 g/dL    HCT 38.7 (L) 42.0 - 51.0 %    MCV 98.9 78.0 - 99.0 fL    MCH 32.8 (H) 27.0 - 32.0 pg    MCHC 33.2 32.0 - 36.0 g/dL    RDW 17.1 (H) 11.6 - 14.8 %    PLATELETS 184 140 - 440 x10^3/uL    MPV 7.8 7.4 - 10.4 fL    NEUTROPHIL % 44 40 - 76 %    LYMPHOCYTE % 44 25 - 45 %    MONOCYTE % 9 0 - 12 %    EOSINOPHIL % 3 0 - 7 %    BASOPHIL % 0 0 - 3 %    NEUTROPHIL # 2.30 1.80 - 8.40 x10^3/uL    LYMPHOCYTE # 2.28 1.10 - 5.00 x10^3/uL    MONOCYTE # 0.48 0.00 - 1.30 x10^3/uL    EOSINOPHIL # 0.17 0.00 - 0.80 x10^3/uL    BASOPHIL # 0.01 0.00 - 0.30 x10^3/uL   ARTERIAL BLOOD GAS/LACTATE   Result Value Ref Range    PH (ARTERIAL) 7.35 7.35 - 7.45    PCO2 (ARTERIAL) 55 (H) 35 - 45 mm/Hg    BICARBONATE (ARTERIAL) 30.4 (H) 20.0 - 26.0 mmol/L    BASE EXCESS (ARTERIAL) 3.6 (H) -2.0 - 2.0 mmol/L    MET-HEMOGLOBIN 0.3 <=2.0 %    LACTATE 1.5 <=2.0 mmol/L    CARBOXYHEMOGLOBIN 9.5 (HH) <=1.5 %    O2CT 13.5 %    %FIO2 (ARTERIAL) 21 %    PO2 (ARTERIAL) 47 (LL) 80 - 100 mm/Hg    OXYHEMOGLOBIN 73.5 (LL) 88.0 - 100.0 %    ALLEN TEST yes     DRAW SITE lradial    URINALYSIS, MACRO/MICRO   Result Value Ref Range    COLOR Dark Yellow (A) Yellow    APPEARANCE Clear Clear    SPECIFIC GRAVITY 1.010 1.003 - 1.035    PH 6.0 4.6 - 8.0    LEUKOCYTES Negative Negative WBCs/uL    NITRITE Negative Negative    PROTEIN Negative Negative mg/dL    GLUCOSE Negative Negative mg/dL    KETONES Negative Negative mg/dL    BILIRUBIN Negative Negative  mg/dL    BLOOD Negative Negative mg/dL    UROBILINOGEN 0.2 0.2  - 1.0 mg/dL   URINE DRUG SCREEN   Result Value Ref Range    AMPHETAMINES URINE Negative Negative    BARBITURATES URINE Negative Negative    BENZODIAZEPINES URINE Negative Negative    METHADONE URINE Negative Negative    COCAINE METABOLITES URINE Negative Negative    OPIATES URINE Positive (A) Negative    PCP URINE Negative Negative    CANNABINOIDS URINE Negative Negative       Imaging Studies:        Assessment/Plan:   Active Hospital Problems    Diagnosis    Primary Problem: Pneumonia    Alcohol abuse     Pneumonia  -IV Rocephin and IV Zithromax will be ordered for antimicrobial therapy.    -DuoNebs will be ordered q.4h p.r.n. for increased shortness of breath  -plan to hold off on steroids at this time, patient is not having any wheezing.    Alcohol abuse  -CIWA protocol will be ordered   -IV Ativan 1 mg q.2h p.r.n. will be ordered for agitation/alcohol withdrawal  -IV normal saline at 125 cc/hour will be ordered to help with hydration  -p.o.  Thiamine, vitamin B6, and folate will be ordered daily.    Plan to admit patient MIP.  Telemetry, continuous pulse ox, and supplemental oxygen titration p.r.n. will be ordered.  Will verify patient's home medications.  Patient will be ordered a cardiac diet.  Plan repeat labs tomorrow.  See attending addendum and orders for further information.    DVT/PE Prophylaxis: SCDs/ Venodynes/Impulse boots    Gardner Candle, FNP-BC    Contents of the document, in whole or in part, are completed utilizing M*Modal dictation technology, please forgive any typographical errors that may exist.

## 2022-03-29 NOTE — Discharge Instructions (Addendum)
You decided to leave against medical advice.  Antibiotics and steroids were prescribed to minimize harm it has not necessarily going to fully treat all the conditions which you have.  You also have Narcan available to help reduce your risk of deadly overdose .  Return to emergency department if you change your mind about seeking care

## 2022-03-29 NOTE — Progress Notes (Signed)
[facility]     HOSPITALIST PROGRESS NOTE    Assessment/Plan:    Steven Parrish is a 52 y.o. male who presented to [facility] with Pneumonia.      Acute bacterial pneumonia   Acute respiratory with hypoxia and hypercapnia   Continue antibiotics and breathing treatments   Check sputum for culture and Gram stain  Not on home oxygen   Currently requiring 2 L oxygen via nasal cannula  Presenting ABG pH 7.35, pCO2 55, PO2 47    COPD, stable   Tobacco dependence  Continue Pulmicort and DuoNebs  Nicotine patch    Alcohol dependence   Monitor for signs and symptoms of acute alcohol withdrawal   Librium 25 mg t.i.d.   CIWA protocol  Multivitamins    Acute transaminitis   Secondary to alcohol  Monitor LFTs closely    Hypokalemia and hypomagnesemia   Monitor & replace as needed    HTN  GERD  Resume home meds for all chronic/stable medical conditions.        Code Status: Full Code    VTE prophylaxis: Lovenox    Disposition: DC to home when stable       LOS: 0 days     Subjective     Patient seen and examined at bedside.   A&O x4, looks acutely ill  No s/s of etoh withdrawal  No wheezing on lung exam  Continue current treatment plan    Case d/w patient and nursing staff    Objective     Vital signs in last 24 hours:  Filed Vitals:    03/29/22 0710 03/29/22 1033 03/29/22 1202 03/29/22 1222   BP: 108/63  (!) 128/91    Pulse: 88 85 85    Resp: 14  20    Temp: 36.8 C (98.2 F)  36.6 C (97.9 F)    SpO2: 95%  93% 94%       Intake/Output last 3 shifts:    Intake/Output Summary (Last 24 hours) at 03/29/2022 1245  Last data filed at 03/29/2022 0900  Gross per 24 hour   Intake 255 ml   Output --   Net 255 ml        Physical Exam:    General: Well-developed, well-nourished; A&O x4, looks acutely ill  Cardiovascular:  Regular rate, normal rhythm, S1 and S2 present, no murmur, no gallop, no rub, normal peripheral perfusion  Respiratory: diminished at bases   Gastrointestinal: Soft, benign,  nondistended, nontender, no mass  Extremities: No gross deformity, no cyanosis or edema  Skin: Warm, dry; no rashes or lesions    In-Hospital Medications:    Current Facility-Administered Medications   Medication Dose Route Frequency    acetaminophen (TYLENOL) tablet  650 mg Oral Q4H PRN    aluminum-magnesium hydroxide-simethicone (MAG-AL PLUS) 200-200-20 mg per 5 mL oral liquid  30 mL Oral Q4H PRN    [START ON 03/30/2022] azithromycin (ZITHROMAX) 500 mg in NS 250 mL IVPB  500 mg Intravenous Q24H    budesonide (PULMICORT RESPULES) 0.5 mg/2 mL nebulizer suspension  0.5 mg Nebulization 2x/day    cefTRIAXone (ROCEPHIN) 1 g in NS 50 mL IVPB minibag  1 g Intravenous Q24H    chlordiazePOXIDE (LIBRIUM) capsule  25 mg Oral 3x/day    folic acid (FOLVITE) tablet  1 mg Oral Daily    ipratropium-albuterol 0.5 mg-3 mg(2.5 mg base)/3 mL Solution for Nebulization  3 mL Nebulization Q4H PRN    lisinopriL (PRINIVIL) tablet  30 mg  30 mg Oral Daily    LORazepam (ATIVAN) 2 mg/mL injection  1 mg Intravenous Q2H PRN    magnesium hydroxide (MILK OF MAGNESIA) 422m per 539moral liquid  30 mL Oral HS PRN    nicotine (NICODERM CQ) transdermal patch (mg/24 hr)  21 mg Transdermal Now    NS premix infusion   Intravenous Continuous    pantoprazole (PROTONIX) delayed release tablet  40 mg Oral Daily    pyridOXINE Vitamin B6 tablet  100 mg Oral Daily    thiamine-vitamin B1 tablet  100 mg Oral Daily    traZODone (DESYREL) tablet  50 mg Oral HS PRN - MR x 1          Labs:    Results for orders placed or performed during the hospital encounter of 03/29/22 (from the past 24 hour(s))   ARTERIAL BLOOD GAS/LACTATE    Collection Time: 03/29/22  3:23 AM   Result Value Ref Range    PH (ARTERIAL) 7.34 (L) 7.35 - 7.45    PCO2 (ARTERIAL) 51 (H) 35 - 45 mm/Hg    BICARBONATE (ARTERIAL) 26.9 (H) 20.0 - 26.0 mmol/L    BASE EXCESS (ARTERIAL) 0.4 -2.0 - 2.0 mmol/L    MET-HEMOGLOBIN 0.3 <=2.0 %    LACTATE 2.0 <=2.0 mmol/L    CARBOXYHEMOGLOBIN 7.3 (H) <=1.5 %    O2CT  13.7 %    %FIO2 (ARTERIAL) 21 %    PO2 (ARTERIAL) 55 (L) 80 - 100 mm/Hg    OXYHEMOGLOBIN 79.8 (LL) 88.0 - 100.0 %    ALLEN TEST yes     DRAW SITE lradial    Results for orders placed or performed during the hospital encounter of 03/28/22 (from the past 24 hour(s))   COVID-19, FLU A/B, RSV RAPID BY PCR    Collection Time: 03/28/22 10:17 PM   Result Value Ref Range    SARS-CoV-2 Not Detected Not Detected    INFLUENZA VIRUS TYPE A Not Detected Not Detected    INFLUENZA VIRUS TYPE B Not Detected Not Detected    RESPIRATORY SYNCTIAL VIRUS (RSV) Not Detected Not Detected   LACTIC ACID LEVEL W/ REFLEX FOR LEVEL >2.0    Collection Time: 03/28/22 10:20 PM   Result Value Ref Range    LACTIC ACID 1.8 0.4 - 2.0 mmol/L   COMPREHENSIVE METABOLIC PANEL, NON-FASTING    Collection Time: 03/28/22 10:20 PM   Result Value Ref Range    SODIUM 138 136 - 145 mmol/L    POTASSIUM 3.4 (L) 3.5 - 5.1 mmol/L    CHLORIDE 99 98 - 107 mmol/L    CO2 TOTAL 30 21 - 32 mmol/L    ANION GAP 9 4 - 13 mmol/L    BUN 10 7 - 18 mg/dL    CREATININE 0.76 0.70 - 1.30 mg/dL    BUN/CREA RATIO 13     ESTIMATED GFR 109 >59 mL/min/1.7367m   ALBUMIN 3.9 3.4 - 5.0 g/dL    CALCIUM 9.1 8.5 - 10.1 mg/dL    GLUCOSE 97 74 - 106 mg/dL    ALKALINE PHOSPHATASE 365 (H) 46 - 116 U/L    ALT (SGPT) 157 (H) <=78 U/L    AST (SGOT) 206 (H) 15 - 37 U/L    BILIRUBIN TOTAL 0.7 0.2 - 1.0 mg/dL    PROTEIN TOTAL 8.2 6.4 - 8.2 g/dL    ALBUMIN/GLOBULIN RATIO 0.9 0.8 - 1.4    OSMOLALITY, CALCULATED 275 270 - 290 mOsm/kg    CALCIUM,  CORRECTED 9.2 mg/dL    GLOBULIN 4.3    CBC WITH DIFF    Collection Time: 03/28/22 10:20 PM   Result Value Ref Range    WBC 5.2 4.0 - 10.5 x10^3/uL    RBC 3.91 (L) 4.20 - 6.00 x10^6/uL    HGB 12.8 (L) 13.5 - 18.0 g/dL    HCT 38.7 (L) 42.0 - 51.0 %    MCV 98.9 78.0 - 99.0 fL    MCH 32.8 (H) 27.0 - 32.0 pg    MCHC 33.2 32.0 - 36.0 g/dL    RDW 17.1 (H) 11.6 - 14.8 %    PLATELETS 184 140 - 440 x10^3/uL    MPV 7.8 7.4 - 10.4 fL    NEUTROPHIL % 44 40 - 76 %     LYMPHOCYTE % 44 25 - 45 %    MONOCYTE % 9 0 - 12 %    EOSINOPHIL % 3 0 - 7 %    BASOPHIL % 0 0 - 3 %    NEUTROPHIL # 2.30 1.80 - 8.40 x10^3/uL    LYMPHOCYTE # 2.28 1.10 - 5.00 x10^3/uL    MONOCYTE # 0.48 0.00 - 1.30 x10^3/uL    EOSINOPHIL # 0.17 0.00 - 0.80 x10^3/uL    BASOPHIL # 0.01 0.00 - 0.30 x10^3/uL   ARTERIAL BLOOD GAS/LACTATE    Collection Time: 03/28/22 10:53 PM   Result Value Ref Range    PH (ARTERIAL) 7.35 7.35 - 7.45    PCO2 (ARTERIAL) 55 (H) 35 - 45 mm/Hg    BICARBONATE (ARTERIAL) 30.4 (H) 20.0 - 26.0 mmol/L    BASE EXCESS (ARTERIAL) 3.6 (H) -2.0 - 2.0 mmol/L    MET-HEMOGLOBIN 0.3 <=2.0 %    LACTATE 1.5 <=2.0 mmol/L    CARBOXYHEMOGLOBIN 9.5 (HH) <=1.5 %    O2CT 13.5 %    %FIO2 (ARTERIAL) 21 %    PO2 (ARTERIAL) 47 (LL) 80 - 100 mm/Hg    OXYHEMOGLOBIN 73.5 (LL) 88.0 - 100.0 %    ALLEN TEST yes     DRAW SITE lradial    URINALYSIS, MACRO/MICRO    Collection Time: 03/29/22 12:21 AM   Result Value Ref Range    COLOR Dark Yellow (A) Yellow    APPEARANCE Clear Clear    SPECIFIC GRAVITY 1.010 1.003 - 1.035    PH 6.0 4.6 - 8.0    LEUKOCYTES Negative Negative WBCs/uL    NITRITE Negative Negative    PROTEIN Negative Negative mg/dL    GLUCOSE Negative Negative mg/dL    KETONES Negative Negative mg/dL    BILIRUBIN Negative Negative mg/dL    BLOOD Negative Negative mg/dL    UROBILINOGEN 0.2 0.2 - 1.0 mg/dL   URINE DRUG SCREEN    Collection Time: 03/29/22 12:21 AM   Result Value Ref Range    AMPHETAMINES URINE Negative Negative    BARBITURATES URINE Negative Negative    BENZODIAZEPINES URINE Negative Negative    METHADONE URINE Negative Negative    COCAINE METABOLITES URINE Negative Negative    OPIATES URINE Positive (A) Negative    PCP URINE Negative Negative    CANNABINOIDS URINE Negative Negative       Micro:    No results found for any visits on 03/29/22 (from the past 24 hour(s)).     Imaging:    XR AP MOBILE CHEST  Narrative: Dominica Severin THOMAS Jenne    RADIOLOGIST: Ileene Hutchinson    XR AP MOBILE CHEST performed on  03/28/2022 10:23 PM  CLINICAL HISTORY: Cough shortness of breath IV drug abuse evaluate for evidence of endocarditis.  Cough shortness of breath IV drug abuse evaluate for evidence of endocarditis. 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    TECHNIQUE: Frontal view of the chest.    COMPARISON:  Chest radiograph dated 11/29/2021    FINDINGS:    The heart size is normal.   Right basilar opacities are minimal.  Impression: Minimal right basilar atelectasis.    Radiologist location ID: [id number]         Virl Cagey, MD  March 29, 2022    I personally spent 50 minutes face-to-face and non-face-to-face in the care of this patient, which includes all pre, intra, and post visit time on the date of service.  All documented time was specific to the E/M visit and does not include any procedures that may have been performed.

## 2022-03-29 NOTE — Care Plan (Signed)
Problem: Opioid Dependence or Withdrawal  Goal: Withdrawal Symptoms Managed  Outcome: Ongoing (see interventions/notes)     Problem: Alcohol Withdrawal  Goal: Alcohol Withdrawal Symptom Control  Outcome: Ongoing (see interventions/notes)  Goal: Optimal Neurologic Function  Outcome: Ongoing (see interventions/notes)   Care plan initiated

## 2022-03-29 NOTE — Nurses Notes (Signed)
Patient arrives. He is calm and cooperative at this time. States that he "has chronic pain which will get worse due to coming off of pills and alcohol". He has been educated on the use of the call bell if he needs anything. No signs of distress at this time. Call bell and fluids are within reach.

## 2022-03-29 NOTE — Care Plan (Signed)
Problem: Opioid Dependence or Withdrawal  Goal: Withdrawal Symptoms Managed  Outcome: Ongoing (see interventions/notes)     Problem: Alcohol Withdrawal  Goal: Alcohol Withdrawal Symptom Control  Outcome: Ongoing (see interventions/notes)  Goal: Optimal Neurologic Function  Outcome: Ongoing (see interventions/notes)  Goal: Readiness for Change Identified  Outcome: Ongoing (see interventions/notes)

## 2022-03-29 NOTE — ED Provider Notes (Signed)
[facility] Emergency Department  ED Primary Provider Note  HPI:  Steven Parrish is a 52 y.o. male     Patient states he is short of breath.  He feels terrible all over.  He presents after leaving against medical advice less than hour ago after initially presenting with the same symptoms.  Workup at the time reveal hypoxemia likely due to pneumonia.  Patient also had angioedema.  He was stabilized on 2 L oxygen given ceftriaxone methylprednisone.  Patient states he went home and had some alcohol.  Denies drug use.  His lip swelling which was attributed to mild angioedema has improved.  Patient abuses alcohol and opioids.  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  Patient is full code.        ROS review and negative aside from stated in HPI.    Physical Exam:  ED Triage Vitals [03/29/22 0233]   BP (Non-Invasive) 132/89   Heart Rate (!) 102   Respiratory Rate 20   Temperature 36.5 C (97.7 F)   SpO2 95 %   Weight 87.5 kg (193 lb)   Height 1.803 m (5' 11"$ )     No acute distress.  Patient awake alert oriented x3.  Mood is appropriate.  Pupils 3 mm equal round reactive.  Extraocular movements are intact.  Oropharynx is clear.  Mucous membranes moist.  Trachea midline.  Neck is supple.  Heart has regular rate and rhythm without significant murmurs rubs or gallops.  Lungs are clear to auscultation.  Abdomen soft nontender, nondistended.  Moving all extremities without difficulty.  No rash no edema.      Patient data:  Labs Ordered/Reviewed   ARTERIAL BLOOD GAS/LACTATE - Abnormal; Notable for the following components:       Result Value    PH (ARTERIAL) 7.34 (*)     PCO2 (ARTERIAL) 51 (*)     BICARBONATE (ARTERIAL) 26.9 (*)     CARBOXYHEMOGLOBIN 7.3 (*)     PO2 (ARTERIAL) 55 (*)     OXYHEMOGLOBIN 79.8 (*)     All other components within normal limits     No orders to  display       MDM:  Shortness of breath.  Diagnosed with hypoxemia likely secondary to pneumonia.  Angioedema has improved.  Patient slightly tachycardic initial sat here within normal limits.  But he does desaturate to the low 80s .  Patient has received ceftriaxone he will be given a dose of azithromycin here.  He also received a dose of steroid.  He will be placed on CIWA although he does not appear to be in severe withdrawal.    ED Course as of 03/29/22 0338   Sun Mar 29, 2022   0320 Patient kindly accepted by [facility].  Hospitalist requests repeat ABG.  Patient desaturating to 86% on room air.      Admitted  Clinical Impression   Respiratory failure with hypoxia (CMS HCC) (Primary)   Pneumonia   Angioedema   Intravenous drug abuse (CMS HCC)   Polysubstance abuse (CMS HCC)     Medications Administered in the ED   chlordiazePOXIDE (LIBRIUM) capsule (25 mg Oral Given 03/29/22 0313)   azithromycin (ZITHROMAX) 500 mg in NS 250 mL IVPB (500 mg Intravenous New Bag/New Syringe 03/29/22 0314)   nicotine (NICODERM CQ) transdermal patch (mg/24 hr) (21 mg Transdermal Patch Applied  03/29/22 0320)   ondansetron (ZOFRAN) 2 mg/mL injection (4 mg Intravenous Given 03/29/22 0313)        Current Discharge Medication List        CONTINUE these medications - NO CHANGES were made during your visit.        Details   amoxicillin 500 mg Capsule  Commonly known as: AMOXIL   1,000 mg, Oral, 3 TIMES DAILY  Qty: 60 Capsule  Refills: 0     gabapentin 400 mg Capsule  Commonly known as: NEURONTIN   400 mg, Oral, 2 TIMES DAILY  Qty: 10 Capsule  Refills: 0     Lisinopril 30 mg Tablet  Commonly known as: PRINIVIL   30 mg, Oral, DAILY  Qty: 30 Tablet  Refills: 0     naloxone 4 mg/actuation Spray, Non-Aerosol  Commonly known as: NARCAN   1 Spray, INTRANASAL, EVERY 2 MIN PRN, for actual or suspected opioid overdose. Call 911 if used.  Qty: 2 Each  Refills: 2     omeprazole 20 mg Capsule, Delayed Release(E.C.)  Commonly known as:  PRILOSEC   20 mg, Oral, DAILY  Refills: 0     predniSONE 50 mg Tablet  Commonly known as: DELTASONE   50 mg, Oral, DAILY  Qty: 5 Tablet  Refills: 0

## 2022-03-29 NOTE — ED Nurses Note (Signed)
Patient refused to be admitted and requested to sign out AMA. Provider notified.

## 2022-03-29 NOTE — Nurses Notes (Signed)
Patient took medications without difficulty. He has been calm and cooperative at this time. CIWA assessments continue. No signs of distress at this time. Call bell and fluids are within reach.

## 2022-03-29 NOTE — ED Triage Notes (Addendum)
Patient states that he was just in ED and left AMA. States that he should not have left and states that he feels like he cannot breath and that he needs oxygen. Pt has no increased work of breathing at this time. Pt states that he is starting to have "withdrawals from alcohol and pills."

## 2022-03-29 NOTE — ED Nurses Note (Signed)
[facility] arrived to transport pt to [facility] at  this time. Pt still on O2 at 2 liters via NC and remains alert/o x4 at this time.

## 2022-03-29 NOTE — Respiratory Therapy (Signed)
Smoking education provided, patient states he'll consider trying to quit smoking, tobacco cessation program pamphlet discussed and left with patient. He did express the need for a nicotine patch, patients nurse notified.

## 2022-03-29 NOTE — ED Nurses Note (Signed)
PT able to eat meal tray and fruit cup without any nausea. Stating he feels like his nausea was from not eating for a few days.

## 2022-03-29 NOTE — ED Nurses Note (Signed)
Report called to 2nd floor at [facility].

## 2022-03-30 LAB — CBC WITH DIFF
BASOPHIL #: 0 10*3/uL (ref 0.00–0.10)
BASOPHIL %: 0 % (ref 0–1)
EOSINOPHIL #: 0 10*3/uL (ref 0.00–0.50)
EOSINOPHIL %: 0 % — ABNORMAL LOW
HCT: 35.5 % — ABNORMAL LOW (ref 36.7–47.1)
HGB: 11.9 g/dL — ABNORMAL LOW (ref 12.5–16.3)
LYMPHOCYTE #: 1.5 10*3/uL (ref 1.00–3.00)
LYMPHOCYTE %: 14 % — ABNORMAL LOW (ref 16–44)
MCH: 31.7 pg (ref 23.8–33.4)
MCHC: 33.5 g/dL (ref 32.5–36.3)
MCV: 94.7 fL (ref 73.0–96.2)
MONOCYTE #: 0.6 10*3/uL (ref 0.30–1.00)
MONOCYTE %: 6 % (ref 5–13)
MPV: 7.7 fL (ref 7.4–11.4)
NEUTROPHIL #: 8.2 10*3/uL — ABNORMAL HIGH (ref 1.85–7.80)
NEUTROPHIL %: 80 % — ABNORMAL HIGH (ref 43–77)
PLATELETS: 133 10*3/uL — ABNORMAL LOW (ref 140–440)
RBC: 3.74 10*6/uL — ABNORMAL LOW (ref 4.06–5.63)
RDW: 17.3 % — ABNORMAL HIGH (ref 12.1–16.2)
WBC: 10.3 10*3/uL — ABNORMAL HIGH (ref 3.6–10.2)

## 2022-03-30 LAB — BASIC METABOLIC PANEL
ANION GAP: 7 mmol/L (ref 4–13)
BUN/CREA RATIO: 18 (ref 6–22)
BUN: 12 mg/dL (ref 7–25)
CALCIUM: 9.2 mg/dL (ref 8.6–10.3)
CHLORIDE: 107 mmol/L (ref 98–107)
CO2 TOTAL: 25 mmol/L (ref 21–31)
CREATININE: 0.65 mg/dL (ref 0.60–1.30)
ESTIMATED GFR: 115 mL/min/{1.73_m2} (ref >59)
GLUCOSE: 130 mg/dL — ABNORMAL HIGH (ref 74–109)
OSMOLALITY, CALCULATED: 279 mOsm/kg (ref 270–290)
POTASSIUM: 3.1 mmol/L — ABNORMAL LOW (ref 3.5–5.1)
SODIUM: 139 mmol/L (ref 136–145)

## 2022-03-30 LAB — MAGNESIUM: MAGNESIUM: 1.5 mg/dL — ABNORMAL LOW (ref 1.9–2.7)

## 2022-03-30 MED ORDER — MAGNESIUM SULFATE 1 GRAM/100 ML IN DEXTROSE 5 % INTRAVENOUS PIGGYBACK
1.0000 g | INJECTION | INTRAVENOUS | Status: DC
Start: 2022-03-30 — End: 2022-03-30
  Administered 2022-03-30: 0 g via INTRAVENOUS
  Administered 2022-03-30: 1 g via INTRAVENOUS
  Filled 2022-03-30: qty 200

## 2022-03-30 MED ORDER — GABAPENTIN 400 MG CAPSULE
400.0000 mg | ORAL_CAPSULE | Freq: Once | ORAL | Status: DC
Start: 2022-03-30 — End: 2022-03-30
  Administered 2022-03-30: 0 mg via ORAL
  Filled 2022-03-30: qty 1

## 2022-03-30 MED ORDER — CHLORDIAZEPOXIDE 25 MG CAPSULE
ORAL_CAPSULE | ORAL | 0 refills | Status: AC
Start: 2022-03-30 — End: 2022-04-11

## 2022-03-30 MED ORDER — POTASSIUM CHLORIDE ER 20 MEQ TABLET,EXTENDED RELEASE(PART/CRYST)
20.0000 meq | ORAL_TABLET | Freq: Three times a day (TID) | ORAL | Status: DC
Start: 2022-03-30 — End: 2022-03-30
  Administered 2022-03-30: 20 meq via ORAL
  Filled 2022-03-30: qty 1

## 2022-03-30 MED ORDER — LORAZEPAM 2 MG/ML INJECTION WRAPPER
1.0000 mg | INTRAMUSCULAR | Status: AC
Start: 2022-03-30 — End: 2022-03-30
  Administered 2022-03-30: 1 mg via INTRAVENOUS
  Filled 2022-03-30: qty 1

## 2022-03-30 MED ORDER — PYRIDOXINE (VITAMIN B6) 100 MG TABLET
100.0000 mg | ORAL_TABLET | Freq: Every day | ORAL | 0 refills | Status: AC
Start: 2022-03-31 — End: 2022-04-30

## 2022-03-30 MED ORDER — THIAMINE MONONITRATE (VITAMIN B1) 100 MG TABLET
100.0000 mg | ORAL_TABLET | Freq: Every day | ORAL | 0 refills | Status: AC
Start: 2022-03-31 — End: 2022-04-30

## 2022-03-30 MED ORDER — CHLORDIAZEPOXIDE 25 MG CAPSULE
50.0000 mg | ORAL_CAPSULE | Freq: Four times a day (QID) | ORAL | Status: DC
Start: 2022-03-30 — End: 2022-03-30
  Administered 2022-03-30: 50 mg via ORAL
  Filled 2022-03-30: qty 2

## 2022-03-30 MED ORDER — FOLIC ACID 1 MG TABLET
1.0000 mg | ORAL_TABLET | Freq: Every day | ORAL | 0 refills | Status: AC
Start: 2022-03-31 — End: 2022-04-30

## 2022-03-30 NOTE — Care Plan (Signed)
Problem: Opioid Dependence or Withdrawal  Goal: Withdrawal Symptoms Managed  Outcome: Ongoing (see interventions/notes)     Problem: Alcohol Withdrawal  Goal: Alcohol Withdrawal Symptom Control  Outcome: Ongoing (see interventions/notes)  Goal: Optimal Neurologic Function  Outcome: Ongoing (see interventions/notes)  Goal: Readiness for Change Identified  Outcome: Ongoing (see interventions/notes)   Pt is aggressive angry agitated and has threatened ama several times - Pt is complaining that ordered meds are inappropriate and not enough to treat his condition- the MD is aware and has increased librium for relief - prns are being used at ordered limits.  Will continue to monitor

## 2022-03-30 NOTE — Care Plan (Signed)
Problem: Opioid Dependence or Withdrawal  Goal: Withdrawal Symptoms Managed  03/30/2022 1030 by Linward Foster, RN  Outcome: Ongoing (see interventions/notes)  03/30/2022 0959 by Linward Foster, RN  Outcome: Ongoing (see interventions/notes)     Problem: Alcohol Withdrawal  Goal: Alcohol Withdrawal Symptom Control  03/30/2022 1030 by Linward Foster, RN  Outcome: Ongoing (see interventions/notes)  03/30/2022 0959 by Linward Foster, RN  Outcome: Ongoing (see interventions/notes)  Goal: Optimal Neurologic Function  03/30/2022 1030 by Linward Foster, RN  Outcome: Ongoing (see interventions/notes)  03/30/2022 0959 by Linward Foster, RN  Outcome: Ongoing (see interventions/notes)  Goal: Readiness for Change Identified  03/30/2022 1030 by Linward Foster, RN  Outcome: Ongoing (see interventions/notes)  03/30/2022 0959 by Linward Foster, RN  Outcome: Ongoing (see interventions/notes)

## 2022-03-30 NOTE — Nurses Notes (Signed)
Patient called out and stated that the ativan and morphine are not helping him. He is restless and anxious. NP E. [place] notified.

## 2022-03-30 NOTE — Discharge Summary (Signed)
[name]     HOSPITALIST DISCHARGE SUMMARY            Identifying Information:   Steven Parrish  52 y.o.  male  Apr 21, 1970  [id number]    Primary Care Physician: Ella Jubilee, DO     Admit Date: 03/29/2022    Discharge Date: 03/30/2022     Discharge To: Home discharge    Discharge Service: Inpatient Hopitalist Service    Discharge Attending Physician: Virl Cagey, MD       Discharge Diagnoses:    PRIMARY DIAGNOSIS   Acute bacterial pneumonia   Acute respiratory with hypoxia and hypercapnia   Alcohol dependence   Acute Alcohol Withdrawal as of 2/19  Acute transaminitis   Hypokalemia and hypomagnesemia         SECONDARY DIAGNOSES   COPD, stable   Tobacco dependence  HTN  GERD           LOS: 1 day          HPI:  Steven Parrish is a 52 y.o., White male who presents to [facility] from [facility] with complaints of increased shortness of breath.  Patient states he has been having increased shortness of breath for the last 3-4 days.  Patient states he woke up yesterday his mouth was swollen.  ED provider did state the patient had some angio edema had improved.  Patient denies any injury.  Patient does not wear any home O2.  Patient states he does abuse opioids as well as alcohol.  Patient states he gets Dilaudid off of the street.  Patient states he drinks 1 pt or more of alcohol on a daily basis.  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  Patient had imaging studies in the ED that was suggestive of pneumonia.  Patient denies any other complaints at this time.  Patient will be admitted to hospitalist services for further evaluation.     Hospital Course:   Steven Parrish is a 52 y.o. male who presented to [facility] with Pneumonia.        Acute bacterial pneumonia   Acute respiratory with hypoxia and hypercapnia   Continue antibiotics and breathing treatments   Check sputum for culture and Gram stain  Not on home oxygen    Initially requiring 2 L oxygen via nasal cannula  Presenting ABG pH 7.35, pCO2 55, PO2 47  Now stable on RA with at 95% RA  Rec continuing Augmentin and prednosone     COPD, stable   Tobacco dependence  Continue Pulmicort and DuoNebs  Nicotine patch     Alcohol dependence   Acute Alcohol Withdrawal as of 2/19  Librium 25 mg t.i.d.   CIWA protocol  Multivitamins  Patient and family wishing to detox at home and wishing for DC  Rx issued for librium taper     Acute transaminitis   Secondary to alcohol  Monitor LFTs closely     Hypokalemia and hypomagnesemia   Monitor & replace as needed     HTN  GERD    Consults:  None     Procedures:  No admission procedures for hospital encounter.        Outpatient Provider Follow Up Issues:   PCP       ______________________________________________________________________  Physical Exam:    Vital Signs:  BP (!) 156/113   Pulse 90   Temp 36.7 C (98 F)   Resp (!)  21   Ht 1.803 m (5' 10.98")   Wt 87.5 kg (192 lb 14.4 oz)   SpO2 94%   BMI 26.92 kg/m          Exam:  General: Well-developed, well-nourished  Cardiovascular:  Regular rate, normal rhythm, S1 and S2 present, no murmur, no gallop, no rub, normal peripheral perfusion  Respiratory: Clear to auscultation bilaterally, bilateral breath sounds, normal effort  Gastrointestinal: Soft, benign, nondistended, nontender, no mass  Extremities: No gross deformity, no cyanosis or edema  Skin: Warm, dry; no rashes or lesions    ______________________________________________________________________  Discharge Medications:     Current Discharge Medication List        START taking these medications.        Details   chlordiazePOXIDE 25 mg Capsule  Commonly known as: LIBRIUM  Start taking on: March 30, 2022   Take 2 Capsules (50 mg total) by mouth Every 6 hours for 3 days, THEN 1 Capsule (25 mg total) Every 6 hours for 3 days, THEN 1 Capsule (25 mg total) Twice daily for 3 days, THEN 1 Capsule (25 mg total) Once a day for 3  days.  Qty: 45 Capsule  Refills: 0     folic acid 1 mg Tablet  Commonly known as: FOLVITE  Start taking on: March 31, 2022   1 mg, Oral, DAILY  Qty: 30 Tablet  Refills: 0     pyridoxine 100 mg Tablet  Commonly known as: VITAMIN B6  Start taking on: March 31, 2022   100 mg, Oral, DAILY  Qty: 30 Tablet  Refills: 0     thiamine mononitrate 100 mg Tablet  Start taking on: March 31, 2022   100 mg, Oral, DAILY  Qty: 30 Tablet  Refills: 0            CONTINUE these medications - NO CHANGES were made during your visit.        Details   amoxicillin 500 mg Capsule  Commonly known as: AMOXIL   1,000 mg, Oral, 3 TIMES DAILY  Qty: 60 Capsule  Refills: 0     naloxone 4 mg/actuation Spray, Non-Aerosol  Commonly known as: NARCAN   1 Spray, INTRANASAL, EVERY 2 MIN PRN, for actual or suspected opioid overdose. Call 911 if used.  Qty: 2 Each  Refills: 2     omeprazole 20 mg Capsule, Delayed Release(E.C.)  Commonly known as: PRILOSEC   20 mg, Oral, DAILY  Refills: 0     predniSONE 50 mg Tablet  Commonly known as: DELTASONE   50 mg, Oral, DAILY  Qty: 5 Tablet  Refills: 0            STOP taking these medications.      gabapentin 400 mg Capsule  Commonly known as: NEURONTIN            ASK your doctor about these medications.        Details   Lisinopril 30 mg Tablet  Commonly known as: PRINIVIL   30 mg, Oral, DAILY  Qty: 30 Tablet  Refills: 0               Allergies:  Allergies   Allergen Reactions    Chocolate [Cocoa]     Sulfa (Sulfonamides)  Other Adverse Reaction (Add comment)     Can't move    Triple Antibiotic [Neomy-Bacit-Polymyx-Pramoxine]       ______________________________________________________________________    Most Recent Labs:  Results for orders placed or performed during the  hospital encounter of 03/29/22 (from the past 24 hour(s))   CBC/DIFF    Narrative    The following orders were created for panel order CBC/DIFF.  Procedure                               Abnormality         Status                     ---------                                -----------         ------                     CBC WITH [id number]                Abnormal            Final result                 Please view results for these tests on the individual orders.   BASIC METABOLIC PANEL, NON-FASTING   Result Value Ref Range    SODIUM 139 136 - 145 mmol/L    POTASSIUM 3.1 (L) 3.5 - 5.1 mmol/L    CHLORIDE 107 98 - 107 mmol/L    CO2 TOTAL 25 21 - 31 mmol/L    ANION GAP 7 4 - 13 mmol/L    CALCIUM 9.2 8.6 - 10.3 mg/dL    GLUCOSE 130 (H) 74 - 109 mg/dL    BUN 12 7 - 25 mg/dL    CREATININE 0.65 0.60 - 1.30 mg/dL    BUN/CREA RATIO 18 6 - 22    ESTIMATED GFR 115 >59 mL/min/1.28m2    OSMOLALITY, CALCULATED 279 270 - 290 mOsm/kg    Narrative    Estimated Glomerular Filtration Rate (eGFR) is calculated using the CKD-EPI (2021) equation, intended for patients 174years of age and older. If gender is not documented or "unknown", there will be no eGFR calculation.     MAGNESIUM   Result Value Ref Range    MAGNESIUM 1.5 (L) 1.9 - 2.7 mg/dL   CBC WITH DIFF   Result Value Ref Range    WBC 10.3 (H) 3.6 - 10.2 x10^3/uL    RBC 3.74 (L) 4.06 - 5.63 x10^6/uL    HGB 11.9 (L) 12.5 - 16.3 g/dL    HCT 35.5 (L) 36.7 - 47.1 %    MCV 94.7 73.0 - 96.2 fL    MCH 31.7 23.8 - 33.4 pg    MCHC 33.5 32.5 - 36.3 g/dL    RDW 17.3 (H) 12.1 - 16.2 %    PLATELETS 133 (L) 140 - 440 x10^3/uL    MPV 7.7 7.4 - 11.4 fL    NEUTROPHIL % 80 (H) 43 - 77 %    LYMPHOCYTE % 14 (L) 16 - 44 %    MONOCYTE % 6 5 - 13 %    EOSINOPHIL % 0 (L) %    BASOPHIL % 0 0 - 1 %    NEUTROPHIL # 8.20 (H) 1.85 - 7.80 x10^3/uL    LYMPHOCYTE # 1.50 1.00 - 3.00 x10^3/uL    MONOCYTE # 0.60 0.30 - 1.00 x10^3/uL    EOSINOPHIL # 0.00 0.00 - 0.50 x10^3/uL    BASOPHIL # 0.00  0.00 - 0.10 x10^3/uL        Relevant Studies/Radiology (if blank, then none):  No results found.    ______________________________________________________________________  Discharge Day Services:  Pt seen on the day of discharge and determined appropriate for  discharge.    Condition at Discharge: good      DISCHARGE INSTRUCTIONS:    Post-Discharge Follow Up Appointments       Follow up with Ella Jubilee, DO in 1 week(s)    Phone: [phone]    Where: [place], [place]  [id number]             DISCHARGE INSTRUCTION - DIET     Diet: RESUME HOME DIET      DISCHARGE INSTRUCTION - ACTIVITY     Activity: AS TOLERATED      DISCHARGE INSTRUCTION - MISC    Follow Up with your PCP in 1 week.                Length of Discharge: I spent greater than 30 mins in the discharge of this patient.      Virl Cagey, MD   March 30, 2022

## 2022-03-30 NOTE — Nurses Notes (Signed)
Patient refused the 474m Gabapentin. He stated "I need 1600 mg for one dose". Patient is also saying he is going to sign out AMA by 0600. Will continue to monitor. Call bell and fluids are within reach.

## 2022-03-31 NOTE — ED Nurses Note (Signed)
[facility], [facility] Emergency Department  Peer Recovery Coach Assessment    Initial Evaluation  Referred by:: Nurse  Location of Evaluation: Emergency Department Via Phone  How many times in the last 12 months have you been to the ED?: 6 or more  Have you ever served or are you currently serving in the [place]?: No             Substance Use History  Patient current substance use status: Patient states that he is using IV Dilaudid and drinks alcohol Daily.    Prior treatment history?: Yes    Currently enrolled in substance use program?: No    Within the last 30 days, what substances has the patient used?: Alcohol, Opiates, Nicotine  Age of first opiate use: 52 years old  When was the patient's last use of opiates/heroin?: 0-24 hours  Patient's age at first substance use?: 21-25  Drug route of administration: Oral, Injected    Has the patient ever had sustained abstinence?: Yes  When did sustained abstinence occur?: 5 or more years ago    Does patient want opiate use treatment?: No         Family, Social, Home & Safety History  Marital Status: Single            Need to improve relationships with family?: Yes    Social network: Substance using peers    Current living situation: Independent  Any help needed with the following?: None  Contact phone number for the patient: [phone]       Has the patient had any legal issues within the past 30 days?: Other (Patient was in PD custody on 02/13.)         Employment  Current employment status: Disabled    Landscape architect?: No  Needs assistance with job search?: No    Engagement  Readiness ruler: 2  Summary of assessment priority areas: Mental Health, Safety, Substance abuse treatment    Brief Intervention  Discussed plan to reduce/quit substance use?: Yes  Discussed willingness to enter treatment?: Yes  Indicated patient's stage of change:: 2 - Contemplation    Patient seen by Peer Recovery Coach and is a candidate for  buprenorphine administration in the ED. Patient needs assessment for bup treatment.: No    Plan  Was the patient referred to treatment?: No    Was patient referred to physician for Buprenorphine Assessment in the ED?: No    Did patient receive Narcan in the ED?: No         Follow-up  Patient admitted for treatment?: No        Need for additional follow-up?: Yes       Callie Fielding, Peer Recovery Coach 03/31/2022 12:34

## 2022-04-01 ENCOUNTER — Telehealth (HOSPITAL_COMMUNITY): Payer: Self-pay

## 2022-04-02 LAB — ADULT ROUTINE BLOOD CULTURE, SET OF 2 BOTTLES (BACTERIA AND YEAST)
BLOOD CULTURE, ROUTINE: NO GROWTH
BLOOD CULTURE, ROUTINE: NO GROWTH

## 2022-04-28 ENCOUNTER — Emergency Department (HOSPITAL_BASED_OUTPATIENT_CLINIC_OR_DEPARTMENT_OTHER): Payer: 59

## 2022-04-28 ENCOUNTER — Inpatient Hospital Stay (HOSPITAL_COMMUNITY): Payer: 59 | Admitting: Internal Medicine

## 2022-04-28 ENCOUNTER — Encounter (HOSPITAL_BASED_OUTPATIENT_CLINIC_OR_DEPARTMENT_OTHER): Payer: Self-pay

## 2022-04-28 ENCOUNTER — Inpatient Hospital Stay
Admission: EM | Admit: 2022-04-28 | Discharge: 2022-04-28 | DRG: 193 | Discharge status: Against Medical Advice | Payer: 59 | Attending: Internal Medicine | Admitting: Internal Medicine

## 2022-04-28 ENCOUNTER — Other Ambulatory Visit: Payer: Self-pay

## 2022-04-28 DIAGNOSIS — Z1152 Encounter for screening for COVID-19: Secondary | ICD-10-CM

## 2022-04-28 DIAGNOSIS — F1020 Alcohol dependence, uncomplicated: Secondary | ICD-10-CM | POA: Diagnosis present

## 2022-04-28 DIAGNOSIS — B1920 Unspecified viral hepatitis C without hepatic coma: Secondary | ICD-10-CM

## 2022-04-28 DIAGNOSIS — F1910 Other psychoactive substance abuse, uncomplicated: Secondary | ICD-10-CM | POA: Diagnosis present

## 2022-04-28 DIAGNOSIS — K529 Noninfective gastroenteritis and colitis, unspecified: Secondary | ICD-10-CM | POA: Diagnosis present

## 2022-04-28 DIAGNOSIS — R7401 Elevation of levels of liver transaminase levels: Secondary | ICD-10-CM | POA: Diagnosis present

## 2022-04-28 DIAGNOSIS — Z79899 Other long term (current) drug therapy: Secondary | ICD-10-CM

## 2022-04-28 DIAGNOSIS — R000 Tachycardia, unspecified: Secondary | ICD-10-CM

## 2022-04-28 DIAGNOSIS — Z8711 Personal history of peptic ulcer disease: Secondary | ICD-10-CM

## 2022-04-28 DIAGNOSIS — K709 Alcoholic liver disease, unspecified: Secondary | ICD-10-CM | POA: Diagnosis present

## 2022-04-28 DIAGNOSIS — F1110 Opioid abuse, uncomplicated: Secondary | ICD-10-CM | POA: Diagnosis present

## 2022-04-28 DIAGNOSIS — R109 Unspecified abdominal pain: Secondary | ICD-10-CM | POA: Diagnosis present

## 2022-04-28 DIAGNOSIS — K7460 Unspecified cirrhosis of liver: Secondary | ICD-10-CM | POA: Diagnosis present

## 2022-04-28 DIAGNOSIS — F1010 Alcohol abuse, uncomplicated: Secondary | ICD-10-CM | POA: Diagnosis present

## 2022-04-28 DIAGNOSIS — I10 Essential (primary) hypertension: Secondary | ICD-10-CM | POA: Diagnosis present

## 2022-04-28 DIAGNOSIS — R0902 Hypoxemia: Principal | ICD-10-CM

## 2022-04-28 DIAGNOSIS — I493 Ventricular premature depolarization: Secondary | ICD-10-CM

## 2022-04-28 DIAGNOSIS — M545 Low back pain, unspecified: Secondary | ICD-10-CM

## 2022-04-28 DIAGNOSIS — J189 Pneumonia, unspecified organism: Principal | ICD-10-CM | POA: Diagnosis present

## 2022-04-28 DIAGNOSIS — F17210 Nicotine dependence, cigarettes, uncomplicated: Secondary | ICD-10-CM | POA: Diagnosis present

## 2022-04-28 DIAGNOSIS — J9601 Acute respiratory failure with hypoxia: Secondary | ICD-10-CM | POA: Diagnosis present

## 2022-04-28 LAB — CBC WITH DIFF
BASOPHIL #: 0.01 10*3/uL (ref 0.00–0.30)
BASOPHIL %: 0 % (ref 0–3)
EOSINOPHIL #: 0.11 10*3/uL (ref 0.00–0.80)
EOSINOPHIL %: 3 % (ref 0–7)
HCT: 42.3 % (ref 42.0–51.0)
HGB: 14 g/dL (ref 13.5–18.0)
LYMPHOCYTE #: 1.46 10*3/uL (ref 1.10–5.00)
LYMPHOCYTE %: 39 % (ref 25–45)
MCH: 31.5 pg (ref 27.0–32.0)
MCHC: 33.1 g/dL (ref 32.0–36.0)
MCV: 95 fL (ref 78.0–99.0)
MONOCYTE #: 0.35 10*3/uL (ref 0.00–1.30)
MONOCYTE %: 9 % (ref 0–12)
MPV: 7.3 fL — ABNORMAL LOW (ref 7.4–10.4)
NEUTROPHIL #: 1.8 10*3/uL (ref 1.80–8.40)
NEUTROPHIL %: 48 % (ref 40–76)
PLATELETS: 148 10*3/uL (ref 140–440)
RBC: 4.45 10*6/uL (ref 4.20–6.00)
RDW: 16.7 % — ABNORMAL HIGH (ref 11.6–14.8)
WBC: 3.7 10*3/uL — ABNORMAL LOW (ref 4.0–10.5)

## 2022-04-28 LAB — COMPREHENSIVE METABOLIC PANEL, NON-FASTING
ALBUMIN/GLOBULIN RATIO: 0.8 (ref 0.8–1.4)
ALBUMIN: 3.7 g/dL (ref 3.4–5.0)
ALKALINE PHOSPHATASE: 755 U/L — ABNORMAL HIGH (ref 46–116)
ALT (SGPT): 292 U/L — ABNORMAL HIGH (ref <=78)
ANION GAP: 13 mmol/L (ref 4–13)
AST (SGOT): 508 U/L — ABNORMAL HIGH (ref 15–37)
BILIRUBIN TOTAL: 0.7 mg/dL (ref 0.2–1.0)
BUN/CREA RATIO: 12
BUN: 10 mg/dL (ref 7–18)
CALCIUM, CORRECTED: 9.1 mg/dL
CALCIUM: 8.9 mg/dL (ref 8.5–10.1)
CHLORIDE: 97 mmol/L — ABNORMAL LOW (ref 98–107)
CO2 TOTAL: 26 mmol/L (ref 21–32)
CREATININE: 0.82 mg/dL (ref 0.70–1.30)
ESTIMATED GFR: 107 mL/min/{1.73_m2} (ref >59)
GLOBULIN: 4.9
GLUCOSE: 102 mg/dL (ref 74–106)
OSMOLALITY, CALCULATED: 271 mOsm/kg (ref 270–290)
POTASSIUM: 3.7 mmol/L (ref 3.5–5.1)
PROTEIN TOTAL: 8.6 g/dL — ABNORMAL HIGH (ref 6.4–8.2)
SODIUM: 136 mmol/L (ref 136–145)

## 2022-04-28 LAB — ECG 12 LEAD
Atrial Rate: 107 {beats}/min
Calculated P Axis: 62 degrees
Calculated R Axis: 84 degrees
Calculated T Axis: 1 degrees
PR Interval: 150 ms
QRS Duration: 82 ms
QT Interval: 346 ms
QTC Calculation: 461 ms
Ventricular rate: 107 {beats}/min

## 2022-04-28 LAB — DRUG SCREEN, NO CONFIRMATION, URINE
AMPHETAMINES URINE: NEGATIVE
BARBITURATES URINE: NEGATIVE
BENZODIAZEPINES URINE: NEGATIVE
CANNABINOIDS URINE: NEGATIVE
COCAINE METABOLITES URINE: NEGATIVE
METHADONE URINE: NEGATIVE
OPIATES URINE: POSITIVE — AB
PCP URINE: NEGATIVE

## 2022-04-28 LAB — LAVENDER TOP TUBE

## 2022-04-28 LAB — URINALYSIS, MACRO/MICRO
BILIRUBIN: NEGATIVE mg/dL
GLUCOSE: NEGATIVE mg/dL
KETONES: NEGATIVE mg/dL
LEUKOCYTES: NEGATIVE WBCs/uL
NITRITE: NEGATIVE
PH: 6 (ref 4.6–8.0)
PROTEIN: NEGATIVE mg/dL
SPECIFIC GRAVITY: 1.015 (ref 1.003–1.035)
UROBILINOGEN: 0.2 mg/dL (ref 0.2–1.0)

## 2022-04-28 LAB — ARTERIAL BLOOD GAS/LACTATE
%FIO2 (ARTERIAL): 21 %
BASE EXCESS (ARTERIAL): 2.8 mmol/L — ABNORMAL HIGH (ref 0.0–2.0)
BICARBONATE (ARTERIAL): 26.4 mmol/L — ABNORMAL HIGH (ref 20.0–26.0)
CARBOXYHEMOGLOBIN: 9.5 % (ref <=1.5)
LACTATE: 1.8 mmol/L (ref <=2.0)
MET-HEMOGLOBIN: 0 % (ref <=2.0)
O2CT: 14.9 %
OXYHEMOGLOBIN: 85 % — ABNORMAL LOW (ref 88.0–100.0)
PCO2 (ARTERIAL): 36 mm/Hg (ref 35–45)
PH (ARTERIAL): 7.47 — ABNORMAL HIGH (ref 7.35–7.45)
PO2 (ARTERIAL): 61 mm/Hg — ABNORMAL LOW (ref 80–100)

## 2022-04-28 LAB — ACETAMINOPHEN LEVEL: ACETAMINOPHEN LEVEL: 0

## 2022-04-28 LAB — COVID-19, FLU A/B, RSV RAPID BY PCR
INFLUENZA VIRUS TYPE A: NOT DETECTED
INFLUENZA VIRUS TYPE B: NOT DETECTED
RESPIRATORY SYNCTIAL VIRUS (RSV): NOT DETECTED
SARS-CoV-2: NOT DETECTED

## 2022-04-28 LAB — MAGNESIUM: MAGNESIUM: 1.6 mg/dL — ABNORMAL LOW (ref 1.8–2.4)

## 2022-04-28 LAB — GOLD TOP TUBE

## 2022-04-28 LAB — URINALYSIS, MICROSCOPIC

## 2022-04-28 LAB — ETHANOL, SERUM/PLASMA: ETHANOL: 166 mg/dL — ABNORMAL HIGH (ref <=3)

## 2022-04-28 LAB — BLUE TOP TUBE

## 2022-04-28 LAB — SALICYLATE ACID LEVEL: SALICYLATE LEVEL: 3 mg/dL (ref 3–20)

## 2022-04-28 LAB — TROPONIN-I: TROPONIN I: 4 ng/L (ref <20)

## 2022-04-28 LAB — LIGHT GREEN TOP TUBE

## 2022-04-28 LAB — CREATINE KINASE (CK), TOTAL, SERUM OR PLASMA: CREATINE KINASE: 77 U/L (ref 39–308)

## 2022-04-28 LAB — LACTIC ACID LEVEL W/ REFLEX FOR LEVEL >2.0: LACTIC ACID: 2 mmol/L (ref 0.4–2.0)

## 2022-04-28 LAB — GRAY TOP TUBE

## 2022-04-28 MED ORDER — PYRIDOXINE (VITAMIN B6) 50 MG TABLET
100.0000 mg | ORAL_TABLET | Freq: Every day | ORAL | Status: DC
Start: 2022-04-28 — End: 2022-04-28
  Administered 2022-04-28: 100 mg via ORAL
  Filled 2022-04-28: qty 2

## 2022-04-28 MED ORDER — OXYCODONE 5 MG TABLET
5.0000 mg | ORAL_TABLET | ORAL | Status: DC | PRN
Start: 2022-04-28 — End: 2022-04-28
  Administered 2022-04-28: 5 mg via ORAL
  Filled 2022-04-28: qty 1

## 2022-04-28 MED ORDER — AZITHROMYCIN 500 MG INTRAVENOUS SOLUTION
INTRAVENOUS | Status: AC
Start: 2022-04-28 — End: 2022-04-28
  Filled 2022-04-28: qty 5

## 2022-04-28 MED ORDER — VANCOMYCIN IV - PHARMACIST TO DOSE PER PROTOCOL
Freq: Every day | Status: DC | PRN
Start: 2022-04-28 — End: 2022-04-28

## 2022-04-28 MED ORDER — LORAZEPAM 2 MG/ML INJECTION SYRINGE
INJECTION | INTRAMUSCULAR | Status: AC
Start: 2022-04-28 — End: 2022-04-28
  Filled 2022-04-28: qty 1

## 2022-04-28 MED ORDER — LACTATED RINGERS INTRAVENOUS SOLUTION
INTRAVENOUS | Status: DC
Start: 2022-04-28 — End: 2022-04-28
  Administered 2022-04-28: 1000 mL via INTRAVENOUS

## 2022-04-28 MED ORDER — ONDANSETRON HCL (PF) 4 MG/2 ML INJECTION SOLUTION
INTRAMUSCULAR | Status: AC
Start: 2022-04-28 — End: 2022-04-28
  Filled 2022-04-28: qty 2

## 2022-04-28 MED ORDER — THIAMINE MONONITRATE (VITAMIN B1) 100 MG TABLET
100.0000 mg | ORAL_TABLET | Freq: Every day | ORAL | Status: DC
Start: 2022-04-28 — End: 2022-04-28
  Administered 2022-04-28: 100 mg via ORAL

## 2022-04-28 MED ORDER — HEPARIN (PORCINE) 5,000 UNIT/ML INJECTION SOLUTION
5000.0000 [IU] | Freq: Three times a day (TID) | INTRAMUSCULAR | Status: DC
Start: 2022-04-28 — End: 2022-04-28
  Administered 2022-04-28: 5000 [IU] via SUBCUTANEOUS
  Filled 2022-04-28: qty 1

## 2022-04-28 MED ORDER — LORAZEPAM 2 MG/ML INJECTION WRAPPER
2.0000 mg | INTRAMUSCULAR | Status: DC | PRN
Start: 2022-04-28 — End: 2022-04-28
  Administered 2022-04-28: 2 mg via INTRAVENOUS
  Filled 2022-04-28: qty 1

## 2022-04-28 MED ORDER — IPRATROPIUM 0.5 MG-ALBUTEROL 3 MG (2.5 MG BASE)/3 ML NEBULIZATION SOLN
3.0000 mL | INHALATION_SOLUTION | RESPIRATORY_TRACT | Status: DC | PRN
Start: 2022-04-28 — End: 2022-04-28

## 2022-04-28 MED ORDER — FOLIC ACID 1 MG TABLET
1.0000 mg | ORAL_TABLET | Freq: Every day | ORAL | Status: DC
Start: 2022-04-28 — End: 2022-04-28
  Administered 2022-04-28: 1 mg via ORAL
  Filled 2022-04-28: qty 1

## 2022-04-28 MED ORDER — TRAZODONE 50 MG TABLET
50.0000 mg | ORAL_TABLET | Freq: Every evening | ORAL | Status: DC | PRN
Start: 2022-04-28 — End: 2022-04-28

## 2022-04-28 MED ORDER — ACETAMINOPHEN 325 MG TABLET
650.0000 mg | ORAL_TABLET | ORAL | Status: DC | PRN
Start: 2022-04-28 — End: 2022-04-28

## 2022-04-28 MED ORDER — SODIUM CHLORIDE 0.9 % INTRAVENOUS PIGGYBACK
2.0000 g | Freq: Two times a day (BID) | INTRAVENOUS | Status: DC
Start: 2022-04-28 — End: 2022-04-28
  Administered 2022-04-28: 0 g via INTRAVENOUS
  Administered 2022-04-28: 2 g via INTRAVENOUS

## 2022-04-28 MED ORDER — CEFEPIME 2 GRAM SOLUTION FOR INJECTION
INTRAMUSCULAR | Status: AC
Start: 2022-04-28 — End: 2022-04-28
  Filled 2022-04-28: qty 12.5

## 2022-04-28 MED ORDER — METHYLPREDNISOLONE SOD SUCCINATE 40 MG/ML SOLUTION FOR INJ. WRAPPER
40.0000 mg | Freq: Four times a day (QID) | INTRAMUSCULAR | Status: DC
Start: 2022-04-28 — End: 2022-04-28
  Administered 2022-04-28: 40 mg via INTRAVENOUS
  Filled 2022-04-28: qty 1

## 2022-04-28 MED ORDER — LORAZEPAM 2 MG/ML INJECTION WRAPPER
1.0000 mg | INTRAMUSCULAR | Status: AC
Start: 2022-04-28 — End: 2022-04-28
  Administered 2022-04-28: 1 mg via INTRAVENOUS

## 2022-04-28 MED ORDER — METHYLPREDNISOLONE SOD SUCC 125 MG SOLUTION FOR INJECTION WRAPPER
INTRAVENOUS | Status: AC
Start: 2022-04-28 — End: 2022-04-28
  Filled 2022-04-28: qty 2

## 2022-04-28 MED ORDER — ONDANSETRON HCL (PF) 4 MG/2 ML INJECTION SOLUTION
4.0000 mg | Freq: Four times a day (QID) | INTRAMUSCULAR | Status: DC | PRN
Start: 2022-04-28 — End: 2022-04-28

## 2022-04-28 MED ORDER — SODIUM CHLORIDE 0.9 % INTRAVENOUS PIGGYBACK
2.0000 g | Freq: Two times a day (BID) | INTRAVENOUS | Status: DC
Start: 2022-04-28 — End: 2022-04-28
  Filled 2022-04-28: qty 12.5

## 2022-04-28 MED ORDER — VANCOMYCIN 1,000 MG INTRAVENOUS INJECTION
20.0000 mg/kg | INTRAVENOUS | Status: AC
Start: 2022-04-28 — End: 2022-04-28
  Administered 2022-04-28: 0 mg via INTRAVENOUS
  Administered 2022-04-28: 1750 mg via INTRAVENOUS
  Filled 2022-04-28: qty 20

## 2022-04-28 MED ORDER — ONDANSETRON HCL (PF) 4 MG/2 ML INJECTION SOLUTION
4.0000 mg | INTRAMUSCULAR | Status: AC
Start: 2022-04-28 — End: 2022-04-28
  Administered 2022-04-28: 4 mg via INTRAVENOUS

## 2022-04-28 MED ORDER — SODIUM CHLORIDE 0.9 % IV BOLUS
2000.0000 mL | INJECTION | Status: AC
Start: 2022-04-28 — End: 2022-04-28
  Administered 2022-04-28: 0 mL via INTRAVENOUS
  Administered 2022-04-28: 2000 mL via INTRAVENOUS

## 2022-04-28 MED ORDER — IPRATROPIUM 0.5 MG-ALBUTEROL 3 MG (2.5 MG BASE)/3 ML NEBULIZATION SOLN
INHALATION_SOLUTION | RESPIRATORY_TRACT | Status: AC
Start: 2022-04-28 — End: 2022-04-28
  Filled 2022-04-28: qty 3

## 2022-04-28 MED ORDER — THIAMINE MONONITRATE (VITAMIN B1) 100 MG TABLET
100.0000 mg | ORAL_TABLET | Freq: Every day | ORAL | Status: DC
Start: 2022-04-28 — End: 2022-04-28
  Filled 2022-04-28: qty 1

## 2022-04-28 MED ORDER — METHYLPREDNISOLONE SOD SUCC 125 MG SOLUTION FOR INJECTION WRAPPER
125.0000 mg | INTRAVENOUS | Status: AC
Start: 2022-04-28 — End: 2022-04-28
  Administered 2022-04-28: 125 mg via INTRAVENOUS

## 2022-04-28 MED ORDER — THIAMINE MONONITRATE (VITAMIN B1) 100 MG TABLET
ORAL_TABLET | ORAL | Status: AC
Start: 2022-04-28 — End: 2022-04-28
  Filled 2022-04-28: qty 1

## 2022-04-28 MED ORDER — VANCOMYCIN 10 GRAM INTRAVENOUS SOLUTION
20.0000 mg/kg | Freq: Two times a day (BID) | INTRAVENOUS | Status: DC
Start: 2022-04-28 — End: 2022-04-28
  Filled 2022-04-28 (×2): qty 17.5

## 2022-04-28 MED ORDER — MAGNESIUM SULFATE 1 GRAM/100 ML IN DEXTROSE 5 % INTRAVENOUS PIGGYBACK
INJECTION | INTRAVENOUS | Status: AC
Start: 2022-04-28 — End: 2022-04-28
  Filled 2022-04-28: qty 200

## 2022-04-28 MED ORDER — MAGNESIUM SULFATE 1 GRAM/100 ML IN DEXTROSE 5 % INTRAVENOUS PIGGYBACK
1.0000 g | INJECTION | Freq: Once | INTRAVENOUS | Status: AC
Start: 2022-04-28 — End: 2022-04-28
  Administered 2022-04-28: 1 g via INTRAVENOUS
  Administered 2022-04-28: 0 g via INTRAVENOUS

## 2022-04-28 MED ORDER — MAGNESIUM HYDROXIDE 400 MG/5 ML ORAL SUSPENSION
30.0000 mL | Freq: Every evening | ORAL | Status: DC | PRN
Start: 2022-04-28 — End: 2022-04-28

## 2022-04-28 MED ORDER — MAGNESIUM SULFATE 1 GRAM/100 ML IN DEXTROSE 5 % INTRAVENOUS PIGGYBACK
1.0000 g | INJECTION | Freq: Once | INTRAVENOUS | Status: AC
Start: 2022-04-28 — End: 2022-04-28
  Administered 2022-04-28: 0 g via INTRAVENOUS
  Administered 2022-04-28: 1 g via INTRAVENOUS

## 2022-04-28 MED ORDER — CHLORDIAZEPOXIDE 25 MG CAPSULE
25.0000 mg | ORAL_CAPSULE | Freq: Three times a day (TID) | ORAL | Status: DC
Start: 2022-04-28 — End: 2022-04-28
  Administered 2022-04-28: 0 mg via ORAL
  Filled 2022-04-28: qty 1

## 2022-04-28 MED ORDER — VANCOMYCIN 1,000 MG INTRAVENOUS INJECTION
INTRAVENOUS | Status: AC
Start: 2022-04-28 — End: 2022-04-28
  Filled 2022-04-28: qty 10

## 2022-04-28 MED ORDER — IOHEXOL 350 MG IODINE/ML INTRAVENOUS SOLUTION
100.0000 mL | INTRAVENOUS | Status: AC
Start: 2022-04-28 — End: 2022-04-28
  Administered 2022-04-28: 75 mL via INTRAVENOUS

## 2022-04-28 MED ORDER — ALUMINUM-MAG HYDROXIDE-SIMETHICONE 200 MG-200 MG-20 MG/5 ML ORAL SUSP
30.0000 mL | ORAL | Status: DC | PRN
Start: 2022-04-28 — End: 2022-04-28

## 2022-04-28 MED ORDER — IPRATROPIUM 0.5 MG-ALBUTEROL 3 MG (2.5 MG BASE)/3 ML NEBULIZATION SOLN
3.0000 mL | INHALATION_SOLUTION | RESPIRATORY_TRACT | Status: AC
Start: 2022-04-28 — End: 2022-04-28
  Administered 2022-04-28: 3 mL via RESPIRATORY_TRACT

## 2022-04-28 MED ORDER — SODIUM CHLORIDE 0.9 % INTRAVENOUS SOLUTION
500.0000 mg | INTRAVENOUS | Status: AC
Start: 2022-04-28 — End: 2022-04-28
  Administered 2022-04-28: 0 mg via INTRAVENOUS
  Administered 2022-04-28: 500 mg via INTRAVENOUS
  Filled 2022-04-28: qty 5

## 2022-04-28 NOTE — Care Plan (Signed)
[facility] Plan Note    Situation: Pt. Came into the emergency room due to SOB and lower back pain. Pt. Admitted for pneumonia and transaminitis.    Intervention: Monitor vitals and labs, monitor and manage pain, fall precautions initiated, CIWA in place, IV fluid therapy.    Response: Pt. Is anxious and restless.      Kara Mead, RN      Problem: Opioid Dependence or Withdrawal  Goal: Withdrawal Symptoms Managed  Outcome: Ongoing (see interventions/notes)     Problem: Fall Injury Risk  Goal: Absence of Fall and Fall-Related Injury  Outcome: Ongoing (see interventions/notes)  Intervention: Promote Injury-Free Environment  Recent Flowsheet Documentation  Taken 04/28/2022 1041 by Kara Mead, RN  Safety Promotion/Fall Prevention:   activity supervised   fall prevention program maintained   nonskid shoes/slippers when out of bed   safety round/check completed     Problem: Fall Injury Risk  Goal: Absence of Fall and Fall-Related Injury  Outcome: Ongoing (see interventions/notes)  Intervention: Promote Injury-Free Environment  Recent Flowsheet Documentation  Taken 04/28/2022 1041 by Kara Mead, RN  Safety Promotion/Fall Prevention:   activity supervised   fall prevention program maintained   nonskid shoes/slippers when out of bed   safety round/check completed     Problem: Health Knowledge, Opportunity to Enhance (Adult,Obstetrics,Pediatric)  Goal: Knowledgeable about Health Subject/Topic  Description: Patient will demonstrate the desired outcomes by discharge/transition of care.  Outcome: Ongoing (see interventions/notes)     Problem: Alcohol Withdrawal  Goal: Alcohol Withdrawal Symptom Control  Outcome: Ongoing (see interventions/notes)  Goal: Optimal Neurologic Function  Outcome: Ongoing (see interventions/notes)  Goal: Readiness for Change Identified  Outcome: Ongoing (see interventions/notes)     Problem: Gas Exchange Impaired  Goal: Optimal Gas Exchange  Outcome: Ongoing (see  interventions/notes)     Problem: Pneumonia  Goal: Fluid Balance  Outcome: Ongoing (see interventions/notes)  Goal: Resolution of Infection Signs and Symptoms  Outcome: Ongoing (see interventions/notes)  Goal: Effective Oxygenation and Ventilation  Outcome: Ongoing (see interventions/notes)  Intervention: Promote Airway Secretion Clearance  Recent Flowsheet Documentation  Taken 04/28/2022 1041 by Kara Mead, RN  Cough And Deep Breathing: done independently per patient

## 2022-04-28 NOTE — ED Notes (Signed)
[facility] contacted to transport patient to main campus at this time

## 2022-04-28 NOTE — ED Nurses Note (Signed)
[facility], [facility] Emergency Department  Peer Recovery Coach Assessment    Initial Evaluation  Referred by:: Nurse  Location of Evaluation: Emergency Department  How many times in the last 12 months have you been to the ED?: 6 or more  Have you ever served or are you currently serving in the [place]?: Yes  In which branch of the [employer]?: [employer]     Do you receive care from the [place]?: Yes    Substance Use History  Patient current substance use status: Patient stated he uses dilaudid intravenously, daily, up to 15 times a day. Patient stated he also drinks 3 pints of alcohol daily. Last opioid use was this morning. 04/28/22.    Prior treatment history?: Yes  Prior treatment program types: Inpatient  Number of prior treatment admissions: 1    Currently enrolled in substance use program?: No    Within the last 30 days, what substances has the patient used?: Opiates, Alcohol, Nicotine  Age of first opiate use: 52 years old  When was the patient's last use of opiates/heroin?: 0-24 hours  Patient's age at first substance use?: 21-25  Drug route of administration: Injected, Oral    Has the patient ever had sustained abstinence?: Yes  When did sustained abstinence occur?: 5 or more years ago    Does patient want opiate use treatment?: No         Family, Social, Home & Safety History  Marital Status: Single    Number of children: 0       Need to improve relationships with family?: Yes    Social network: Substance using peers    Current living situation: With friends  Any help needed with the following?: None  Contact phone number for the patient: [phone]  Emergency contact name and phone number: None listed    Has the patient had any legal issues within the past 30 days?: None         Employment  Current employment status: Disabled    Landscape architect?: No  Needs assistance with job search?: No    Engagement  Readiness ruler: 1  Summary of assessment priority areas:  Substance abuse treatment, Safety, Mental Health    Brief Intervention  Discussed plan to reduce/quit substance use?: Yes  Discussed willingness to enter treatment?: Yes  Indicated patient's stage of change:: 1 - Precontemplation    Patient seen by Peer Recovery Coach and is a candidate for buprenorphine administration in the ED. Patient needs assessment for bup treatment.: Yes    Plan  Was the patient referred to treatment?: No    Was patient referred to physician for Buprenorphine Assessment in the ED?: No (Patient stated he cannot stand suboxone)    Did patient receive Narcan in the ED?: No         Follow-up  Patient admitted for treatment?: No        Need for additional follow-up?: Yes  Additional comments: Patient current substance use status Patient stated he uses dilaudid intravenously, daily, up to 15 times a day. Patient stated he also drinks 3 pints of alcohol daily. Last opioid use was this morning. 04/28/22. Patient statewd he is really not wanting treatment and wants to just get fluid and antibiotics and get out of here. Patient refused any treatment option presented to him. Discussed harm reduction, safe practices, and long-term affects.    Waverly Ferrari, Peer Recovery Coach 04/28/2022 08:11

## 2022-04-28 NOTE — Nurses Notes (Signed)
Patient requesting to leave AMA- states the "Dilaudid and alcohol withdrawal is too much". This nurse offered to call physician to obtain additional orders for withdrawal, patient refuses. Dr. Arvin Collard notified.

## 2022-04-28 NOTE — Nurses Notes (Signed)
Incident report filed for leaving AMA.

## 2022-04-28 NOTE — ED Nurses Note (Signed)
Pt removed from [place] O2 per Md order.

## 2022-04-28 NOTE — ED Nurses Note (Incomplete)
Transported to [facility]  room 301  report called to [employer]  iv vancomycin infusing at 260cc/hr

## 2022-04-28 NOTE — Nurses Notes (Signed)
Patient refusing to wear telemetry monitor. Dr. Arvin Collard notified.

## 2022-04-28 NOTE — ED Provider Notes (Signed)
[facility], [facility] Emergency Department  ED Primary Provider Note  HPI:  Steven Parrish is a 52 y.o. male     Patient complains of low back pain.  He states he can hardly walk secondary to the pain.  Denies any specific injury but he is worried that his kidneys maybe failure secondary to excessive alcohol use.  Denies numbness and tingling in the groin.  Denies fevers or chills.  Symptoms have been ongoing for about a week.  Patient states he is also short of breath.  Denies chest pain Patient has a history of alcoholism last drink was last night.  He also abuses IV drugs states he shoots Dilaudid last use was also last night.  He was requesting admission to go somewhere where he can dry out.  Review of chart shows patient was hospitalized for acute bacterial pneumonia 2/18 with hypoxia and hypercapnia.  Patient also had I old angioedema.  He was treated with a round of antibiotics discharged with Augmentin and prednisone.  Patient is not sure whether he filled his prescription took his medications.  He is full code.  Denies SI denies HI.      ROS review and negative aside from stated in HPI.    Physical Exam:  ED Triage Vitals [04/28/22 0355]   BP (Non-Invasive) (!) 145/103   Heart Rate (!) 126   Respiratory Rate 20   Temperature 36.6 C (97.9 F)   SpO2 93 %   Weight 90.7 kg (200 lb)   Height 1.803 m (5\' 11" )     No acute distress.  Disheveled.  Patient awake alert oriented x3.  Mood is appropriate.  Pupils 3 mm equal round reactive.  Extraocular movements are intact.  Oropharynx is clear.  Mucous membranes moist.  Trachea midline.  Neck is supple.  Heart has regular rate and rhythm without significant murmurs rubs or gallops.  Lungs are clear to auscultation.  Abdomen soft nontender, nondistended.  Patient has pain with for of the low back .  No CVA tenderness.  He was able to move his lower extremities against resistance.  Normal lower extremity DTRs.  No rash no  edema.      Patient data:  Labs Ordered/Reviewed   ETHANOL, SERUM/PLASMA - Abnormal; Notable for the following components:       Result Value    ETHANOL 166 (*)     All other components within normal limits   COMPREHENSIVE METABOLIC PANEL, NON-FASTING - Abnormal; Notable for the following components:    CHLORIDE 97 (*)     ALKALINE PHOSPHATASE 755 (*)     ALT (SGPT) 292 (*)     AST (SGOT) 508 (*)     PROTEIN TOTAL 8.6 (*)     All other components within normal limits    Narrative:     Estimated Glomerular Filtration Rate (eGFR) is calculated using the CKD-EPI (2021) equation, intended for patients 7 years of age and older. If gender is not documented or "unknown", there will be no eGFR calculation.   CBC WITH DIFF - Abnormal; Notable for the following components:    WBC 3.7 (*)     RDW 16.7 (*)     MPV 7.3 (*)     All other components within normal limits   DRUG SCREEN, NO CONFIRMATION, URINE - Abnormal; Notable for the following components:    OPIATES URINE Positive (*)     All other components within normal limits   MAGNESIUM -  Abnormal; Notable for the following components:    MAGNESIUM 1.6 (*)     All other components within normal limits   ARTERIAL BLOOD GAS/LACTATE - Abnormal; Notable for the following components:    PH (ARTERIAL) 7.47 (*)     BICARBONATE (ARTERIAL) 26.4 (*)     BASE EXCESS (ARTERIAL) 2.8 (*)     CARBOXYHEMOGLOBIN 9.5 (*)     PO2 (ARTERIAL) 61 (*)     OXYHEMOGLOBIN 85.0 (*)     All other components within normal limits   ACETAMINOPHEN LEVEL - Normal   SALICYLATE ACID LEVEL - Normal   COVID-19, FLU A/B, RSV RAPID BY PCR - Normal    Narrative:     Results are for the simultaneous qualitative identification of SARS-CoV-2 (formerly 2019-nCoV), Influenza A, Influenza B, and RSV RNA. These etiologic agents are generally detectable in nasopharyngeal and nasal swabs during the ACUTE PHASE of infection. Hence, this test is intended to be performed on respiratory specimens collected from individuals  with signs and symptoms of upper respiratory tract infection who meet Centers for Disease Control and Prevention (CDC) clinical and/or epidemiological criteria for Coronavirus Disease 2019 (COVID-19) testing. CDC COVID-19 criteria for testing on human specimens is available at [facility] webpage information for Healthcare Professionals: Coronavirus Disease 2019 (COVID-19) ([email or web address]).     False-negative results may occur if the virus has genomic mutations, insertions, deletions, or rearrangements or if performed very early in the course of illness. Otherwise, negative results indicate virus specific RNA targets are not detected, however negative results do not preclude SARS-CoV-2 infection/COVID-19, Influenza, or Respiratory syncytial virus infection. Results should not be used as the sole basis for patient management decisions. Negative results must be combined with clinical observations, patient history, and epidemiological information. If upper respiratory tract infection is still suspected based on exposure history together with other clinical findings, re-testing should be considered.    Disclaimer:   This assay has been authorized by FDA under an Emergency Use Authorization for use in laboratories certified under the Clinical Laboratory Improvement Amendments of 1988 (CLIA), 42 U.S.C. [phone], to perform high complexity tests. The impacts of vaccines, antiviral therapeutics, antibiotics, chemotherapeutic or immunosuppressant drugs have not been evaluated.     Test methodology:   Cepheid Xpert Xpress SARS-CoV-2/Flu/RSV Assay real-time polymerase chain reaction (RT-PCR) test on the GeneXpert Dx and Xpert Xpress systems.   LACTIC ACID LEVEL W/ REFLEX FOR LEVEL >2.0 - Normal   TROPONIN-I - Normal    Narrative:     Values received on females ranging between 12-15 ng/L MUST include the next serial troponin to review changes in the delta differences as the reference range for  the Access II chemistry analyzer is lower than the established reference range.     CREATINE KINASE (CK), TOTAL, SERUM OR PLASMA - Normal   ADULT ROUTINE BLOOD CULTURE, SET OF 2 BOTTLES (BACTERIA AND YEAST)   ADULT ROUTINE BLOOD CULTURE, SET OF 2 BOTTLES (BACTERIA AND YEAST)   CBC/DIFF    Narrative:     The following orders were created for panel order CBC/DIFF.  Procedure                               Abnormality         Status                     ---------                               -----------         ------  CBC WITH [id number]                Abnormal            Final result                 Please view results for these tests on the individual orders.   BLUE TOP TUBE   URINALYSIS WITH REFLEX MICROSCOPIC AND CULTURE IF POSITIVE    Narrative:     The following orders were created for panel order URINALYSIS WITH REFLEX MICROSCOPIC AND CULTURE IF POSITIVE.  Procedure                               Abnormality         Status                     ---------                               -----------         ------                     URINALYSIS, MACRO/MICRO[[id number]]                                                       Please view results for these tests on the individual orders.   URINALYSIS, MACRO/MICRO   EXTRA TUBES    Narrative:     The following orders were created for panel order EXTRA TUBES.  Procedure                               Abnormality         Status                     ---------                               -----------         ------                     Erskine Emery [id number]                                    Final result               GOLD TOP [id number]                                    In process                   Please view results for these tests on the individual orders.   GOLD TOP TUBE     CT ABDOMEN PELVIS W IV CONTRAST   Final Result by Edi, Radresults In (03/19 0716)   CHANGES IN THE INCLUDED LOWER LUNG FIELDS WHICH COULD REPRESENT PNEUMONIA OR ATELECTASIS.  PLEASE CORRELATE  WITH SIGNS AND SYMPTOMS.      NO DEFINITE ACUTE FINDING IN THE ABDOMEN. A DISTAL COLITIS IS NOT EXCLUDED ALTHOUGH FELT TO BE LESS LIKELY. THE FINDINGS I SUSPECT ARE RELATED TO UNDERDISTENTION. PLEASE CORRELATE WITH SIGNS AND SYMPTOMS          One or more dose reduction techniques were used (e.g., Automated exposure control, adjustment of the mA and/or kV according to patient size, use of iterative reconstruction technique).         Radiologist location ID: [id number]         XR CHEST AP   Final Result by Edi, Radresults In (03/19 [id number])   Bilateral basilar atelectasis improved on the right since the prior exam            Radiologist location ID: [id number]           EKG read by me shows a sinus tachycardia with a rate of 107 occasional PVCs, QTC is 461.  Do not appreciate significant ST or T-wave changes no delta waves are S1 Q3 T3 pattern.      MDM:  Back pain shortness of breath.  DX includes paraspinal abscess, diskitis, pneumonia, bacteremia.  Presentation complicated by IV drug use and alcohol abuse.  Patient does not appear to be in acute withdrawal from either.  Patient is tachycardic afebrile.  ABG on room air 7.45, 36, 61.  Carboxyhemoglobin is 9.5%.  Drug screen returned positive for opioids no other substances.  Lactate within normal limits.  White count 3.7.  Patient has a mild transaminitis with an alk-phos of 755 ALT 292 AST 5 O2.  Normal T bili.  Chest x-ray showed left lower lobe atelectasis versus pneumonia.  CT abdomen pelvis showed small streaky atelectasis scarring in the right middle lobe and lingular lung bases.  Patient also had findings consistent with a left-sided colitis and old moderate compression L2 fracture was noted.  I did review these findings with the patient and answered his questions.  He desats and weight 286% on room air.  Stable on 2 L. given recent hospitalization and treatment for pneumonia in the context of IV drug abuse patient started on cefepime  vancomycin and azithromycin.  Patient also received methylprednisone DuoNeb magnesium supplementation and Zofran as well as IV fluids.  He is resting comfortably on re-evaluation.    ED Course as of 04/28/22 0740   Tue Apr 28, 2022   0719 I did speak with [facility] hospitalist Rayes, she was aware of the patient however there is currently no inpatient bed availability.  I spoke with ED provider Sabra Heck stated he will try to accommodate.        Admitted  Clinical Impression   Hypoxia (Primary)   Pneumonia   IV drug abuse (CMS HCC)   Alcohol abuse     Medications Administered in the ED   thiamine-vitamin B1 tablet (has no administration in time range)   cefepime (MAXIPIME) 2 g in NS 50 mL IVPB minibag (has no administration in time range)   azithromycin (ZITHROMAX) 500 mg in NS 250 mL IVPB (has no administration in time range)   vancomycin (VANCOCIN) 1,750 mg in NS 500 mL IVPB (has no administration in time range)   NS bolus infusion 2,000 mL (0 mL Intravenous Stopped 04/28/22 0520)   magnesium sulfate 1 G in D5W 100 mL premix IVPB (0 g Intravenous Stopped 04/28/22 0517)   magnesium sulfate 1 G in D5W 100 mL premix IVPB (0 g Intravenous Stopped 04/28/22  0518)   ondansetron (ZOFRAN) 2 mg/mL injection (4 mg Intravenous Given 04/28/22 0502)   ipratropium-albuterol 0.5 mg-3 mg(2.5 mg base)/3 mL Solution for Nebulization (3 mL Nebulization Given 04/28/22 0534)   methylPREDNISolone sod succ (SOLU-medrol) 125 mg/2 mL injection (125 mg Intravenous Given 04/28/22 0537)   iohexol (OMNIPAQUE 350) infusion (75 mL Intravenous Given 04/28/22 0525)        Current Discharge Medication List        CONTINUE these medications - NO CHANGES were made during your visit.        Details   folic acid 1 mg Tablet  Commonly known as: FOLVITE   1 mg, Oral, DAILY  Qty: 30 Tablet  Refills: 0     Lisinopril 30 mg Tablet  Commonly known as: PRINIVIL   30 mg, Oral, DAILY  Qty: 30 Tablet  Refills: 0     naloxone 4 mg/actuation Spray, Non-Aerosol  Commonly  known as: NARCAN   1 Spray, INTRANASAL, EVERY 2 MIN PRN, for actual or suspected opioid overdose. Call 911 if used.  Qty: 2 Each  Refills: 2     omeprazole 20 mg Capsule, Delayed Release(E.C.)  Commonly known as: PRILOSEC   20 mg, Oral, DAILY  Refills: 0     pyridoxine 100 mg Tablet  Commonly known as: VITAMIN B6   100 mg, Oral, DAILY  Qty: 30 Tablet  Refills: 0     thiamine mononitrate 100 mg Tablet   100 mg, Oral, DAILY  Qty: 30 Tablet  Refills: 0            ASK your doctor about these medications.        Details   amoxicillin 500 mg Capsule  Commonly known as: AMOXIL  Ask about: Should I take this medication?   1,000 mg, Oral, 3 TIMES DAILY  Qty: 60 Capsule  Refills: 0     chlordiazePOXIDE 25 mg Capsule  Commonly known as: LIBRIUM  Start taking on: March 30, 2022  Ask about: Should I take this medication?   Take 2 Capsules (50 mg total) by mouth Every 6 hours for 3 days, THEN 1 Capsule (25 mg total) Every 6 hours for 3 days, THEN 1 Capsule (25 mg total) Twice daily for 3 days, THEN 1 Capsule (25 mg total) Once a day for 3 days.  Qty: 45 Capsule  Refills: 0     predniSONE 50 mg Tablet  Commonly known as: DELTASONE  Ask about: Should I take this medication?   50 mg, Oral, DAILY  Qty: 5 Tablet  Refills: 0

## 2022-04-28 NOTE — ED Nurses Note (Signed)
Very anxious thrashing in bed sweating CIWA 27  medicated with ativan IV

## 2022-04-28 NOTE — ED Triage Notes (Signed)
Patient with multiple c/o. States that he can hardly walk. He thinks that he is in kidney failure because of excessive drinking. Reports that he feels short of breath. C/O lower back pain for the past week. Reports that he uses IV drugs. States that he needs to "go somewhere to dry out".

## 2022-04-28 NOTE — ED Nurses Note (Signed)
Placed on 3L NC by RT

## 2022-04-28 NOTE — Respiratory Therapy (Signed)
Smoking cessation discussed with patient, he says he is interested in quitting. He currently smokes 2 packs a day, and said he was interested in lozenges to assist him to quit.

## 2022-04-28 NOTE — H&P (Signed)
[place]    HOSPITALIST H&P    Mariah Rheinheimer 52 y.o. male 301/A   Date of Service: 04/28/2022    Date of Admission:  04/28/2022   PCP: Ella Jubilee, DO Code Status:Full Code       Chief Complaint:  " abdominal pain "    HPI:   Patient with past medical history of polysubstance abuse including IV drug abuse, alcohol abuse, hep C, liver cirrhosis, esophageal varices, hypertension.  Presents with a history of abdominal pain which has been going on for several weeks.  Patient reports he has been consuming more alcohol recently and this has exacerbated his abdominal pain.  Patient admits to drinking 2-3 pt of vodka per day.  He does not report any nausea no vomiting no diarrhea.  He also admits to IV drug abuse with the use of street Dilaudid.  Patient does not report any fevers, no chills.  He does not report any recent abdominal surgeries.      ED medications:   Medications Administered in the ED   thiamine-vitamin B1 tablet (has no administration in time range)   cefepime (MAXIPIME) 2 g in NS 50 mL IVPB minibag (has no administration in time range)   NS bolus infusion 2,000 mL (0 mL Intravenous Stopped 04/28/22 0520)   magnesium sulfate 1 G in D5W 100 mL premix IVPB (0 g Intravenous Stopped 04/28/22 0517)   magnesium sulfate 1 G in D5W 100 mL premix IVPB (0 g Intravenous Stopped 04/28/22 0518)   ondansetron (ZOFRAN) 2 mg/mL injection (4 mg Intravenous Given 04/28/22 0502)   ipratropium-albuterol 0.5 mg-3 mg(2.5 mg base)/3 mL Solution for Nebulization (3 mL Nebulization Given 04/28/22 0534)   methylPREDNISolone sod succ (SOLU-medrol) 125 mg/2 mL injection (125 mg Intravenous Given 04/28/22 0537)   iohexol (OMNIPAQUE 350) infusion (75 mL Intravenous Given 04/28/22 0525)   azithromycin (ZITHROMAX) 500 mg in NS 250 mL IVPB (has no administration in time range)   vancomycin (VANCOCIN) 1,750 mg in NS 500 mL IVPB (has no administration in time range)         PMHx:    Past Medical  History:   Diagnosis Date    Alcoholic liver disease (CMS [place])     Cirrhosis of liver (CMS HCC)     Esophageal and gastric varices (CMS [place])      Esophageal hernia     Essential hypertension     IV drug abuse (CMS HCC)     Peptic ulcer         PSHx:   Past Surgical History:   Procedure Laterality Date    HX CHOLECYSTECTOMY      NOSE SURGERY      SINUS SURGERY            Allergies:    Allergies   Allergen Reactions    Chocolate [Cocoa]     Sulfa (Sulfonamides)  Other Adverse Reaction (Add comment)     Can't move    Triple Antibiotic [Neomy-Bacit-Polymyx-Pramoxine]     Social History  Social History     Tobacco Use    Smoking status: Every Day     Current packs/day: 1.00     Types: Cigarettes    Smokeless tobacco: Former     Types: Snuff   Vaping Use    Vaping status: Never Used   Substance Use Topics    Alcohol use: Yes     Comment: 5-6 pints per day    Drug  use: Yes     Types: Opioid     Comment: dilaudid       Family History  Family Medical History:    None            Home Meds:      Prior to Admission medications    Medication Sig Start Date End Date Taking? Authorizing Provider   amoxicillin (AMOXIL) 500 mg Oral Capsule Take 2 Capsules (1,000 mg total) by mouth Three times a day for 10 days 03/29/22 04/08/22  Eustace Quail, MD   chlordiazePOXIDE (LIBRIUM) 25 mg Oral Capsule Take 2 Capsules (50 mg total) by mouth Every 6 hours for 3 days, THEN 1 Capsule (25 mg total) Every 6 hours for 3 days, THEN 1 Capsule (25 mg total) Twice daily for 3 days, THEN 1 Capsule (25 mg total) Once a day for 3 days. 03/30/22 04/11/22  Virl Cagey, MD   folic acid (FOLVITE) 1 mg Oral Tablet Take 1 Tablet (1 mg total) by mouth Once a day for 30 days 03/31/22 04/30/22  Virl Cagey, MD   lisinopriL (PRINIVIL) 30 mg Oral Tablet Take 1 Tablet (30 mg total) by mouth Once a day  Patient not taking: Reported on 03/29/2022 06/27/21   Ouida Sills, MD   naloxone [facility]) 4 mg per spray nasal spray 1 Spray by INTRANASAL route Every  2 minutes as needed for actual or suspected opioid overdose. Call 911 if used.  Patient not taking: Reported on 04/28/2022 03/29/22   Eustace Quail, MD   omeprazole (PRILOSEC) 20 mg Oral Capsule, Delayed Release(E.C.) Take 1 Capsule (20 mg total) by mouth Once a day  Patient not taking: Reported on 04/28/2022    Provider, Historical   predniSONE (DELTASONE) 50 mg Oral Tablet Take 1 Tablet (50 mg total) by mouth Once a day for 5 days 03/29/22 04/03/22  Eustace Quail, MD   pyridoxine, vitamin B6, (VITAMIN B6) 100 mg Oral Tablet Take 1 Tablet (100 mg total) by mouth Once a day for 30 days 03/31/22 04/30/22  Virl Cagey, MD   thiamine mononitrate 100 mg Oral Tablet Take 1 Tablet (100 mg total) by mouth Once a day for 30 days  Patient not taking: Reported on 04/28/2022 03/31/22 04/30/22  Virl Cagey, MD   gabapentin (NEURONTIN) 400 mg Oral Capsule Take 1 Capsule (400 mg total) by mouth Twice daily for 5 days 03/18/22 03/30/22  Bretta Bang, DO          ROS:   General: No fever or chills. No weight changes, fatigue, weakness.   HEENT: No headaches, dizziness, changes in vision, changes in hearing, or difficulty swallowing.    Skin:  No rashes, erythema or bruises.   Cardiac: No chest pain, palpitations, or arrhythmia.    Respiratory: No shortness of breath, cough, or wheezing.  GI: No nausea or vomiting. No abdominal pain.   Urinary: No dysuria, hematuria, or change in frequency.    Vascular: No edema.     Psychiatric: anxiety      Results for orders placed or performed during the hospital encounter of 04/28/22 (from the past 24 hour(s))   ECG 12 LEAD   Result Value Ref Range    Ventricular rate 107 BPM    Atrial Rate 107 BPM    PR Interval 150 ms    QRS Duration 82 ms    QT Interval 346 ms    QTC Calculation 461 ms    Calculated P Axis 62 degrees  Calculated R Axis 84 degrees    Calculated T Axis 1 degrees   ETHANOL, SERUM/PLASMA   Result Value Ref Range    ETHANOL 166 (H) <=3 mg/dL   ACETAMINOPHEN LEVEL   Result  Value Ref Range    ACETAMINOPHEN LEVEL 0    SALICYLATE ACID LEVEL   Result Value Ref Range    SALICYLATE LEVEL 3 3 - 20 mg/dL   COMPREHENSIVE METABOLIC PANEL, NON-FASTING   Result Value Ref Range    SODIUM 136 136 - 145 mmol/L    POTASSIUM 3.7 3.5 - 5.1 mmol/L    CHLORIDE 97 (L) 98 - 107 mmol/L    CO2 TOTAL 26 21 - 32 mmol/L    ANION GAP 13 4 - 13 mmol/L    BUN 10 7 - 18 mg/dL    CREATININE 0.82 0.70 - 1.30 mg/dL    BUN/CREA RATIO 12     ESTIMATED GFR 107 >59 mL/min/1.72m^2    ALBUMIN 3.7 3.4 - 5.0 g/dL    CALCIUM 8.9 8.5 - 10.1 mg/dL    GLUCOSE 102 74 - 106 mg/dL    ALKALINE PHOSPHATASE 755 (H) 46 - 116 U/L    ALT (SGPT) 292 (H) <=78 U/L    AST (SGOT) 508 (H) 15 - 37 U/L    BILIRUBIN TOTAL 0.7 0.2 - 1.0 mg/dL    PROTEIN TOTAL 8.6 (H) 6.4 - 8.2 g/dL    ALBUMIN/GLOBULIN RATIO 0.8 0.8 - 1.4    OSMOLALITY, CALCULATED 271 270 - 290 mOsm/kg    CALCIUM, CORRECTED 9.1 mg/dL    GLOBULIN 4.9    LACTIC ACID LEVEL W/ REFLEX FOR LEVEL >2.0   Result Value Ref Range    LACTIC ACID 2.0 0.4 - 2.0 mmol/L   CBC WITH DIFF   Result Value Ref Range    WBC 3.7 (L) 4.0 - 10.5 x10^3/uL    RBC 4.45 4.20 - 6.00 x10^6/uL    HGB 14.0 13.5 - 18.0 g/dL    HCT 42.3 42.0 - 51.0 %    MCV 95.0 78.0 - 99.0 fL    MCH 31.5 27.0 - 32.0 pg    MCHC 33.1 32.0 - 36.0 g/dL    RDW 16.7 (H) 11.6 - 14.8 %    PLATELETS 148 140 - 440 x10^3/uL    MPV 7.3 (L) 7.4 - 10.4 fL    NEUTROPHIL % 48 40 - 76 %    LYMPHOCYTE % 39 25 - 45 %    MONOCYTE % 9 0 - 12 %    EOSINOPHIL % 3 0 - 7 %    BASOPHIL % 0 0 - 3 %    NEUTROPHIL # 1.80 1.80 - 8.40 x10^3/uL    LYMPHOCYTE # 1.46 1.10 - 5.00 x10^3/uL    MONOCYTE # 0.35 0.00 - 1.30 x10^3/uL    EOSINOPHIL # 0.11 0.00 - 0.80 x10^3/uL    BASOPHIL # 0.01 0.00 - 0.30 x10^3/uL   MAGNESIUM   Result Value Ref Range    MAGNESIUM 1.6 (L) 1.8 - 2.4 mg/dL   TROPONIN-I   Result Value Ref Range    TROPONIN I 4 <20 ng/L   COVID-19, FLU A/B, RSV RAPID BY PCR   Result Value Ref Range    SARS-CoV-2 Not Detected Not Detected    INFLUENZA VIRUS TYPE  A Not Detected Not Detected    INFLUENZA VIRUS TYPE B Not Detected Not Detected    RESPIRATORY SYNCTIAL VIRUS (RSV) Not Detected Not Detected   BLUE  TOP TUBE   Result Value Ref Range    RAINBOW/EXTRA TUBE AUTO RESULT Yes    GOLD TOP TUBE   Result Value Ref Range    RAINBOW/EXTRA TUBE AUTO RESULT Yes    ARTERIAL BLOOD GAS/LACTATE   Result Value Ref Range    PH (ARTERIAL) 7.47 (H) 7.35 - 7.45    PCO2 (ARTERIAL) 36 35 - 45 mm/Hg    BICARBONATE (ARTERIAL) 26.4 (H) 20.0 - 26.0 mmol/L    BASE EXCESS (ARTERIAL) 2.8 (H) 0.0 - 2.0 mmol/L    MET-HEMOGLOBIN 0.0 <=2.0 %    LACTATE 1.8 <=2.0 mmol/L    CARBOXYHEMOGLOBIN 9.5 (HH) <=1.5 %    O2CT 14.9 %    %FIO2 (ARTERIAL) 21 %    PO2 (ARTERIAL) 61 (L) 80 - 100 mm/Hg    OXYHEMOGLOBIN 85.0 (L) 88.0 - 100.0 %    ALLEN TEST YES     DRAW SITE RRAD    CREATINE KINASE (CK), TOTAL, SERUM OR PLASMA   Result Value Ref Range    CREATINE KINASE 77 39 - 308 U/L   URINE DRUG SCREEN   Result Value Ref Range    AMPHETAMINES URINE Negative Negative    BARBITURATES URINE Negative Negative    BENZODIAZEPINES URINE Negative Negative    METHADONE URINE Negative Negative    COCAINE METABOLITES URINE Negative Negative    OPIATES URINE Positive (A) Negative    PCP URINE Negative Negative    CANNABINOIDS URINE Negative Negative   URINALYSIS, MACRO/MICRO   Result Value Ref Range    COLOR Light Yellow (A) Yellow    APPEARANCE Clear Clear    SPECIFIC GRAVITY 1.015 1.003 - 1.035    PH 6.0 4.6 - 8.0    LEUKOCYTES Negative Negative WBCs/uL    NITRITE Negative Negative    PROTEIN Negative Negative mg/dL    GLUCOSE Negative Negative mg/dL    KETONES Negative Negative mg/dL    BILIRUBIN Negative Negative mg/dL    BLOOD Trace (A) Negative mg/dL    UROBILINOGEN 0.2 0.2 - 1.0 mg/dL   URINALYSIS, MICROSCOPIC   Result Value Ref Range    RBCS 0-3 0-3, Not Present /hpf    BACTERIA      WBCS Not Present Not Present, Occasional, 0-5 /hpf          Physical:  Filed Vitals:    04/28/22 0845 04/28/22 0900 04/28/22 0915  04/28/22 1008   BP: 137/87 (!) 148/88 (!) 131/90    Pulse: 94 97 (!) 103 88   Resp: 18 15 15     Temp:       SpO2: 96% 96% 97%       General: Patient is alert and oriented to person, place, and time. No acute distress. Communicates appropriately.   Head: Normocephalic and atraumatic.    Eyes: Pupils equally round and react to light and accommodate. Extraocular movements intact.  Conjunctiva normal. Sclerae are normal.    Heart: Regular rate and rhythm. S1 & S2 present. No S3 or S4. No rubs, gallops, or murmurs appreciated.  Radial and dorsalis pedis pulses +2/4 bilaterally.  Brisk capillary refill.    Lungs: Clear to auscultation bilaterally with no wheezes or rales. Equal chest excursion.  No conversational dyspnea. No respiratory distress noted.   Abdomen: diffuse tenderness  Extremities: No edema, cyanosis, or clubbing. Grossly moves all extremities.    Skin:  multiple track marks on forearms      Diagnostic studies:  No results found.       @  PEVF@    Assessments:  Active Hospital Problems   (*Primary Problem)    Diagnosis    *Transaminitis    Abdominal pain    Acute respiratory failure with hypoxia (CMS HCC)    Alcohol abuse    Pneumonia    IV drug abuse (CMS HCC)     Acute respiratory failure with hypoxia likely secondary to pneumonia:  Continue with IV antibiotics, viral respiratory panel negative, blood cultures pending.    2. Abdominal pain with transaminitis and colitis: Continue with IV fluids.  Patient on IV antibiotics.  GI consulted.      3. History of polysubstance abuse: Continue with CIWA      Code status: Full Code  DVT prophylaxis: heparin  Diet: No diet orders on file      Carron Brazen, [place] HOSPITALIST

## 2022-04-28 NOTE — Consults (Signed)
[facility]  Gastroenterology/ Hepatology Brief Progress Note      Steven Parrish  Date of service: 04/28/2022    [name] to see patient and introduced myself and the physician I work for and advised him that we are consulted for his elevated LFTs. He has REFUSED to be seen by [place].  He has history of alcohol abuse and IV drug user with IV Diluadid 15 times daily that he discussed with the Peer Recovery coach. Patient is already wanting to leave, He has a history of signing out AMA. Discussed with Dr.K.Patel.   [name], NP-C

## 2022-04-28 NOTE — ED Attending Handoff Note (Signed)
[facility], [facility] Emergency Department  Emergency Department  Course Note    Care/report received from Dr. Nelson Chimes, Edwena Blow, MD at  07:40.  Per report:  Steven Parrish is a 52 y.o. male who had concerns including Shortness of Breath.  Patient is complaining of lower back pain where he states he can hardly walk without severe pain to his back.  He is also complaining coughing greenish yellow sputum over the past week.  Patient was hospitalized for pneumonia back on February 18th with hypoxia.  Patient does drink daily.  Patient is unaware of how much he drank last night.  Patient denies any fever chills.  Patient is complaining of shortness breath but denies any chest pain.    Pending labs/imaging/consults:  Patient's alcohol level was 166.  Patient also had an ABG which showed a PO2 of 61 with a pulse ox of 87.  Patient was placed 2 L nasal cannula.  Patient had an x-ray showing for hilar infiltrates as well as lower infiltrates.  Patient also had a CT scan of his abdomen pelvis which was negative.  Patient does have elevated hepatic enzymes.  Patient also was positive for opiates on his urine drug screen.  Patient did have a for Plex done which was negative.  Plan:  Patient is to be admitted to [facility].  Previous provider called Dr. Sabra Heck at [facility] who accepted patient for transfer to their facility.    Course:  Patient's course has been uneventful thus far receiving 1 g magnesium as well as antibiotics for his pneumonia.     Patient will be transferred [facility] for further workup and management.    Disposition: Admitted          Clinical Impression   Hypoxia (Primary)   Pneumonia   IV drug abuse (CMS HCC)   Alcohol abuse         Ammie Ferrier, MD

## 2022-04-28 NOTE — ED Nurses Note (Signed)
O2 >90%. Discussed with MD.  Pt placed back on 2L NC at this time

## 2022-04-29 LAB — HEPATITIS B CORE IGM, AB: HBV CORE IGM ANTIBODY QUALITATIVE: NEGATIVE

## 2022-04-29 LAB — HEPATITIS A (HAV) IGM ANTIBODY: HAV IGM: NEGATIVE

## 2022-04-29 LAB — HEPATITIS B SURFACE ANTIGEN: HBV SURFACE ANTIGEN QUALITATIVE: NEGATIVE

## 2022-04-29 LAB — HEPATITIS C ANTIBODY SCREEN WITH REFLEX TO HCV PCR: HCV ANTIBODY QUALITATIVE: REACTIVE — AB

## 2022-04-30 ENCOUNTER — Telehealth (HOSPITAL_BASED_OUTPATIENT_CLINIC_OR_DEPARTMENT_OTHER): Payer: Self-pay

## 2022-04-30 ENCOUNTER — Emergency Department: Admission: EM | Admit: 2022-04-30 | Discharge: 2022-04-30 | Discharge status: Against Medical Advice | Payer: 59

## 2022-04-30 DIAGNOSIS — Z5321 Procedure and treatment not carried out due to patient leaving prior to being seen by health care provider: Secondary | ICD-10-CM | POA: Insufficient documentation

## 2022-04-30 LAB — HEPATITIS C VIRUS (HCV) RNA DETECTION AND QUANTIFICATION, PCR, PLASMA: HCV QUANTITATIVE PCR: NOT DETECTED

## 2022-05-01 ENCOUNTER — Telehealth (HOSPITAL_BASED_OUTPATIENT_CLINIC_OR_DEPARTMENT_OTHER): Payer: Self-pay

## 2022-05-01 NOTE — ED Nurses Note (Signed)
[facility], [facility] Emergency Department  Peer Recovery Coach Assessment    Initial Evaluation  Referred by:: Nurse  Location of Evaluation: Emergency Department  How many times in the last 12 months have you been to the ED?: 6 or more  Have you ever served or are you currently serving in the [employer]?: Yes  In which branch of the [place] [place]?: [employer]     Do you receive care from the [place]?: Yes    Substance Use History  Patient current substance use status: Patient stated he uses dilaudid intravenously up  to 15 times a day and drinks alcohol daily. Patient stated he will drink 3 pints of alcohol.    Prior treatment history?: Yes  Prior treatment program types: Inpatient  Number of prior treatment admissions: 1    Currently enrolled in substance use program?: No    Within the last 30 days, what substances has the patient used?: Alcohol, Opiates, Nicotine  Age of first opiate use: 52 years old  When was the patient's last use of opiates/heroin?: 0-24 hours  Patient's age at first substance use?: 21-25  Drug route of administration: Oral, Injected    Has the patient ever had sustained abstinence?: No    Does patient want opiate use treatment?: No         Family, Social, Home & Safety History  Marital Status: Single    Number of children: 0       Need to improve relationships with family?: Yes    Social network: Substance using peers    Current living situation: With friends  Any help needed with the following?: None  Contact phone number for the patient: [phone]  Emergency contact name and phone number: None Listed    Has the patient had any legal issues within the past 30 days?: None         Employment  Current employment status: Disabled    Landscape architect?: No  Needs assistance with job search?: No    Engagement  Readiness ruler: 1  Summary of assessment priority areas: Substance abuse treatment    Brief Intervention  Discussed plan to reduce/quit substance use?:  Yes  Discussed willingness to enter treatment?: Yes  Indicated patient's stage of change:: 1 - Precontemplation    Patient seen by Peer Recovery Coach and is a candidate for buprenorphine administration in the ED. Patient needs assessment for bup treatment.: Yes    Plan  Was the patient referred to treatment?: No    Was patient referred to physician for Buprenorphine Assessment in the ED?: No (Patient stated he can not stand it)    Did patient receive Narcan in the ED?: No         Follow-up  Patient admitted for treatment?: No        Need for additional follow-up?: Yes       Waverly Ferrari, Peer Recovery Coach 05/01/2022 09:04

## 2022-05-03 LAB — ADULT ROUTINE BLOOD CULTURE, SET OF 2 BOTTLES (BACTERIA AND YEAST)
BLOOD CULTURE, ROUTINE: NO GROWTH
BLOOD CULTURE, ROUTINE: NO GROWTH

## 2022-05-06 ENCOUNTER — Telehealth (HOSPITAL_BASED_OUTPATIENT_CLINIC_OR_DEPARTMENT_OTHER): Payer: Self-pay

## 2022-05-13 ENCOUNTER — Emergency Department (HOSPITAL_COMMUNITY): Payer: 59

## 2022-05-13 ENCOUNTER — Other Ambulatory Visit: Payer: Self-pay

## 2022-05-13 ENCOUNTER — Encounter (HOSPITAL_BASED_OUTPATIENT_CLINIC_OR_DEPARTMENT_OTHER): Payer: Self-pay

## 2022-05-13 ENCOUNTER — Emergency Department
Admission: EM | Admit: 2022-05-13 | Discharge: 2022-05-13 | Disposition: A | Discharge status: Home / Self Care | Payer: 59 | Attending: Emergency Medicine | Admitting: Emergency Medicine

## 2022-05-13 ENCOUNTER — Emergency Department (EMERGENCY_DEPARTMENT_HOSPITAL)
Admission: EM | Admit: 2022-05-13 | Discharge: 2022-05-14 | Disposition: A | Discharge status: Home / Self Care | Payer: 59 | Source: Home / Self Care | Attending: Emergency Medicine | Admitting: Emergency Medicine

## 2022-05-13 DIAGNOSIS — S63253A Unspecified dislocation of left middle finger, initial encounter: Secondary | ICD-10-CM | POA: Insufficient documentation

## 2022-05-13 DIAGNOSIS — W19XXXA Unspecified fall, initial encounter: Secondary | ICD-10-CM | POA: Insufficient documentation

## 2022-05-13 DIAGNOSIS — Y999 Unspecified external cause status: Secondary | ICD-10-CM

## 2022-05-13 DIAGNOSIS — T148XXA Other injury of unspecified body region, initial encounter: Secondary | ICD-10-CM

## 2022-05-13 DIAGNOSIS — W010XXA Fall on same level from slipping, tripping and stumbling without subsequent striking against object, initial encounter: Secondary | ICD-10-CM

## 2022-05-13 DIAGNOSIS — F1510 Other stimulant abuse, uncomplicated: Secondary | ICD-10-CM | POA: Insufficient documentation

## 2022-05-13 DIAGNOSIS — S82892D Other fracture of left lower leg, subsequent encounter for closed fracture with routine healing: Secondary | ICD-10-CM | POA: Insufficient documentation

## 2022-05-13 DIAGNOSIS — R4689 Other symptoms and signs involving appearance and behavior: Secondary | ICD-10-CM

## 2022-05-13 DIAGNOSIS — S61212D Laceration without foreign body of right middle finger without damage to nail, subsequent encounter: Secondary | ICD-10-CM | POA: Insufficient documentation

## 2022-05-13 DIAGNOSIS — F17210 Nicotine dependence, cigarettes, uncomplicated: Secondary | ICD-10-CM

## 2022-05-13 DIAGNOSIS — S63282A Dislocation of proximal interphalangeal joint of right middle finger, initial encounter: Secondary | ICD-10-CM

## 2022-05-13 DIAGNOSIS — Y929 Unspecified place or not applicable: Secondary | ICD-10-CM

## 2022-05-13 DIAGNOSIS — Y939 Activity, unspecified: Secondary | ICD-10-CM

## 2022-05-13 DIAGNOSIS — F1020 Alcohol dependence, uncomplicated: Secondary | ICD-10-CM

## 2022-05-13 DIAGNOSIS — F1110 Opioid abuse, uncomplicated: Secondary | ICD-10-CM | POA: Insufficient documentation

## 2022-05-13 DIAGNOSIS — S61212A Laceration without foreign body of right middle finger without damage to nail, initial encounter: Secondary | ICD-10-CM | POA: Insufficient documentation

## 2022-05-13 DIAGNOSIS — S61214D Laceration without foreign body of right ring finger without damage to nail, subsequent encounter: Secondary | ICD-10-CM

## 2022-05-13 DIAGNOSIS — S8262XA Displaced fracture of lateral malleolus of left fibula, initial encounter for closed fracture: Secondary | ICD-10-CM | POA: Insufficient documentation

## 2022-05-13 DIAGNOSIS — F10920 Alcohol use, unspecified with intoxication, uncomplicated: Secondary | ICD-10-CM

## 2022-05-13 DIAGNOSIS — W010XXD Fall on same level from slipping, tripping and stumbling without subsequent striking against object, subsequent encounter: Secondary | ICD-10-CM | POA: Insufficient documentation

## 2022-05-13 DIAGNOSIS — Y907 Blood alcohol level of 200-239 mg/100 ml: Secondary | ICD-10-CM

## 2022-05-13 DIAGNOSIS — S82892A Other fracture of left lower leg, initial encounter for closed fracture: Secondary | ICD-10-CM

## 2022-05-13 DIAGNOSIS — S82832A Other fracture of upper and lower end of left fibula, initial encounter for closed fracture: Secondary | ICD-10-CM

## 2022-05-13 DIAGNOSIS — R7989 Other specified abnormal findings of blood chemistry: Secondary | ICD-10-CM | POA: Insufficient documentation

## 2022-05-13 DIAGNOSIS — F10229 Alcohol dependence with intoxication, unspecified: Secondary | ICD-10-CM | POA: Insufficient documentation

## 2022-05-13 DIAGNOSIS — S63259A Unspecified dislocation of unspecified finger, initial encounter: Secondary | ICD-10-CM

## 2022-05-13 DIAGNOSIS — S61219A Laceration without foreign body of unspecified finger without damage to nail, initial encounter: Secondary | ICD-10-CM

## 2022-05-13 LAB — CBC WITH DIFF
BASOPHIL #: 0 10*3/uL (ref 0.00–0.10)
BASOPHIL %: 1 % (ref 0–1)
EOSINOPHIL #: 0.2 10*3/uL (ref 0.00–0.50)
EOSINOPHIL %: 3 %
HCT: 37.8 % (ref 36.7–47.1)
HGB: 12.4 g/dL — ABNORMAL LOW (ref 12.5–16.3)
LYMPHOCYTE #: 2.6 10*3/uL (ref 1.00–3.00)
LYMPHOCYTE %: 45 % — ABNORMAL HIGH (ref 16–44)
MCH: 30.6 pg (ref 23.8–33.4)
MCHC: 32.7 g/dL (ref 32.5–36.3)
MCV: 93.4 fL (ref 73.0–96.2)
MONOCYTE #: 0.7 10*3/uL (ref 0.30–1.00)
MONOCYTE %: 13 % (ref 5–13)
MPV: 7.4 fL (ref 7.4–11.4)
NEUTROPHIL #: 2.3 10*3/uL (ref 1.85–7.80)
NEUTROPHIL %: 39 % — ABNORMAL LOW (ref 43–77)
PLATELETS: 250 10*3/uL (ref 140–440)
RBC: 4.04 10*6/uL — ABNORMAL LOW (ref 4.06–5.63)
RDW: 16.7 % — ABNORMAL HIGH (ref 12.1–16.2)
WBC: 5.9 10*3/uL (ref 3.6–10.2)

## 2022-05-13 LAB — COMPREHENSIVE METABOLIC PANEL, NON-FASTING
ALBUMIN/GLOBULIN RATIO: 1.3 (ref 0.8–1.4)
ALBUMIN: 4.3 g/dL (ref 3.5–5.7)
ALKALINE PHOSPHATASE: 310 U/L — ABNORMAL HIGH (ref 34–104)
ALT (SGPT): 62 U/L — ABNORMAL HIGH (ref 7–52)
ANION GAP: 7 mmol/L (ref 4–13)
AST (SGOT): 99 U/L — ABNORMAL HIGH (ref 13–39)
BILIRUBIN TOTAL: 0.4 mg/dL (ref 0.3–1.2)
BUN/CREA RATIO: 13 (ref 6–22)
BUN: 9 mg/dL (ref 7–25)
CALCIUM, CORRECTED: 9 mg/dL (ref 8.9–10.8)
CALCIUM: 9.2 mg/dL (ref 8.6–10.3)
CHLORIDE: 109 mmol/L — ABNORMAL HIGH (ref 98–107)
CO2 TOTAL: 26 mmol/L (ref 21–31)
CREATININE: 0.68 mg/dL (ref 0.60–1.30)
ESTIMATED GFR: 113 mL/min/{1.73_m2} (ref >59)
GLOBULIN: 3.3 (ref 2.9–5.4)
GLUCOSE: 89 mg/dL (ref 74–109)
OSMOLALITY, CALCULATED: 281 mOsm/kg (ref 270–290)
POTASSIUM: 3.6 mmol/L (ref 3.5–5.1)
PROTEIN TOTAL: 7.6 g/dL (ref 6.4–8.9)
SODIUM: 142 mmol/L (ref 136–145)

## 2022-05-13 LAB — GOLD TOP TUBE

## 2022-05-13 LAB — ETHANOL, SERUM/PLASMA: ETHANOL: 204 mg/dL — ABNORMAL HIGH

## 2022-05-13 LAB — AMMONIA: AMMONIA: 29 umol/L (ref 16–53)

## 2022-05-13 LAB — GRAY TOP TUBE

## 2022-05-13 LAB — BLUE TOP TUBE

## 2022-05-13 MED ORDER — MORPHINE 2 MG/ML INJECTION WRAPPER
2.0000 mg | INJECTION | INTRAMUSCULAR | Status: AC
Start: 2022-05-14 — End: 2022-05-14
  Administered 2022-05-14: 2 mg via INTRAVENOUS

## 2022-05-13 MED ORDER — CEFAZOLIN 1 GRAM SOLUTION FOR INJECTION
INTRAMUSCULAR | Status: AC
Start: 2022-05-13 — End: 2022-05-13
  Filled 2022-05-13: qty 10

## 2022-05-13 MED ORDER — SODIUM CHLORIDE 0.9 % INTRAVENOUS PIGGYBACK
INJECTION | INTRAVENOUS | Status: AC
Start: 2022-05-13 — End: 2022-05-13
  Filled 2022-05-13: qty 100

## 2022-05-13 MED ORDER — CEFUROXIME AXETIL 500 MG TABLET
500.0000 mg | ORAL_TABLET | Freq: Two times a day (BID) | ORAL | 0 refills | Status: AC
Start: 2022-05-13 — End: 2022-05-23

## 2022-05-13 MED ORDER — BACITRACIN 500 UNIT/G OINTMENT TUBE
TOPICAL_OINTMENT | CUTANEOUS | Status: AC
Start: 2022-05-13 — End: 2022-05-13
  Filled 2022-05-13: qty 28.4

## 2022-05-13 MED ORDER — HYDROMORPHONE 2 MG/ML INJECTION WRAPPER
INJECTION | INTRAMUSCULAR | Status: AC
Start: 2022-05-13 — End: 2022-05-13
  Filled 2022-05-13: qty 1

## 2022-05-13 MED ORDER — OXYCODONE-ACETAMINOPHEN 10 MG-325 MG TABLET
1.0000 | ORAL_TABLET | Freq: Four times a day (QID) | ORAL | 0 refills | Status: AC | PRN
Start: 2022-05-13 — End: 2022-05-25

## 2022-05-13 MED ORDER — CEFAZOLIN 1 GRAM SOLUTION FOR INJECTION
INTRAMUSCULAR | Status: AC
Start: 2022-05-13 — End: 2022-05-13
  Filled 2022-05-13: qty 20

## 2022-05-13 MED ORDER — LIDOCAINE HCL 20 MG/ML (2 %) INJECTION SOLUTION
INTRAMUSCULAR | Status: AC
Start: 2022-05-13 — End: 2022-05-13
  Filled 2022-05-13: qty 20

## 2022-05-13 MED ORDER — DIPHTH,PERTUSSIS(ACEL),TETANUS 2.5 LF UNIT-8 MCG-5 LF/0.5ML IM SYRINGE
INJECTION | INTRAMUSCULAR | Status: AC
Start: 2022-05-13 — End: 2022-05-13
  Filled 2022-05-13: qty 0.5

## 2022-05-13 MED ORDER — BACITRACIN ZINC 500 UNIT-POLYMYXIN B 10,000 UNIT/GRAM TOP OINT PACKET
TOPICAL_OINTMENT | CUTANEOUS | Status: AC
Start: 2022-05-13 — End: 2022-05-13
  Filled 2022-05-13: qty 3

## 2022-05-13 MED ORDER — ONDANSETRON HCL (PF) 4 MG/2 ML INJECTION SOLUTION
INTRAMUSCULAR | Status: AC
Start: 2022-05-13 — End: 2022-05-13
  Filled 2022-05-13: qty 2

## 2022-05-13 MED ORDER — DIPHTH,PERTUSSIS(ACEL),TETANUS 2.5 LF UNIT-8 MCG-5 LF/0.5ML IM SYRINGE
0.5000 mL | INJECTION | INTRAMUSCULAR | Status: DC
Start: 2022-05-13 — End: 2022-05-13
  Administered 2022-05-13: 0 mL via INTRAMUSCULAR

## 2022-05-13 MED ORDER — SODIUM CHLORIDE 0.9 % INTRAVENOUS PIGGYBACK
1.0000 g | INTRAVENOUS | Status: AC
Start: 2022-05-14 — End: 2022-05-14
  Administered 2022-05-14: 0 g via INTRAVENOUS
  Administered 2022-05-14: 1 g via INTRAVENOUS

## 2022-05-13 MED ORDER — ONDANSETRON HCL (PF) 4 MG/2 ML INJECTION SOLUTION
4.0000 mg | INTRAMUSCULAR | Status: AC
Start: 2022-05-13 — End: 2022-05-13
  Administered 2022-05-13: 4 mg via INTRAVENOUS

## 2022-05-13 MED ORDER — HYDROMORPHONE 2 MG/ML INJECTION WRAPPER
1.0000 mg | INJECTION | INTRAMUSCULAR | Status: AC
Start: 2022-05-13 — End: 2022-05-13
  Administered 2022-05-13: 1 mg via INTRAVENOUS

## 2022-05-13 MED ORDER — SODIUM CHLORIDE 0.9 % INTRAVENOUS PIGGYBACK
2.0000 g | INTRAVENOUS | Status: AC
Start: 2022-05-13 — End: 2022-05-13
  Administered 2022-05-13: 2 g via INTRAVENOUS
  Administered 2022-05-13: 0 g via INTRAVENOUS

## 2022-05-13 MED ORDER — FENTANYL (PF) 50 MCG/ML INJECTION SOLUTION
100.0000 ug | INTRAMUSCULAR | Status: AC
Start: 2022-05-13 — End: 2022-05-13
  Administered 2022-05-13: 100 ug via INTRAVENOUS

## 2022-05-13 MED ORDER — FENTANYL (PF) 50 MCG/ML INJECTION SOLUTION
INTRAMUSCULAR | Status: AC
Start: 2022-05-13 — End: 2022-05-13
  Filled 2022-05-13: qty 2

## 2022-05-13 MED ORDER — LIDOCAINE HCL 20 MG/ML (2 %) INJECTION SOLUTION
15.0000 mL | INTRAMUSCULAR | Status: AC
Start: 2022-05-13 — End: 2022-05-13
  Administered 2022-05-13: 300 mg via INTRADERMAL

## 2022-05-13 MED ORDER — MORPHINE 2 MG/ML INJECTION WRAPPER
INJECTION | INTRAMUSCULAR | Status: AC
Start: 2022-05-13 — End: 2022-05-13
  Filled 2022-05-13: qty 1

## 2022-05-13 NOTE — ED Provider Notes (Signed)
[facility], [facility] Emergency Department  ED Primary Provider Note  HPI:  Steven Parrish is a 52 y.o. male     Patient complains of pain right finger and ankle pain.  He apparently slipped and fell earlier today suffered a dislocation of the right 3rd finger as well as a left ankle dislocation.  He was seen at [facility] ED.  Had a reduction of the finger and laceration was repaired.  The ankle fracture was addressed.  Prior documentation notes contact with [facility] hand but I am unclear whether patient required either an outpatient evaluation or was supposed to be transferred to [facility] for an urgent hand evaluation.  There is some documentation concerning open fracture although I am not entirely sure where that is originating.  Patient does have a history of IV drug abuse.  He uses Dilaudid methamphetamines and other drugs whenever he can get them.  He denies shortness of breath fevers chills.     ROS review and negative aside from stated in HPI.    Physical Exam:  ED Triage Vitals [05/13/22 2331]   BP (Non-Invasive) (!) 164/97   Heart Rate 98   Respiratory Rate 16   Temperature 36.2 C (97.1 F)   SpO2 97 %   Weight 92.5 kg (204 lb)   Height 1.803 m (5\' 11" )     No acute distress.  Patient awake alert oriented x3.  Mood is appropriate.  Pupils 3 mm equal round reactive.  Extraocular movements are intact.  Oropharynx is clear.  Mucous membranes moist.  Trachea midline.  Neck is supple.  Heart has regular rate and rhythm without significant murmurs rubs or gallops.  Lungs are clear to auscultation.  Abdomen soft nontender, nondistended.  Right 3rd digit appears appropriate line and sutures her holding.  Brisk cap refill all digits.  The good pulses in the bilateral lower extremity.  No rash no edema.      Patient data:  Labs Ordered/Reviewed - No data to display  No orders to display       MDM:    Patient presents as a bounce-back for uncontrolled pain following a fall.  He  sustained no trauma in the interval.  His sutures and hand appear has prior visibly described.  I did speak with [facility] concerning need for possible evaluation as there was some concern for an open fracture.  Close review of chart indicates patient did sustain a deep laceration.  No fracture noted on x-ray.  Patient patient given cefazolin Toradol morphine pain well-controlled.      ED Course as of 05/14/22 0910   Thu May 14, 2022   0145 I did talk to [facility] hand.Dr Ellard Artis who recommended outpatient management.      Discharged  Clinical Impression   Finger laceration (Primary)   Closed left ankle fracture     Medications Administered in the ED   ceFAZolin (ANCEF) 1 g in NS 50 mL IVPB minibag (0 g Intravenous Stopped 05/14/22 0020)   morphine 2 mg/mL injection (2 mg Intravenous Given 05/14/22 0005)   ketorolac (TORADOL) 30 mg/mL injection (15 mg Intravenous Given 05/14/22 0054)        Current Discharge Medication List        CONTINUE these medications - NO CHANGES were made during your visit.        Details   cefuroxime 500 mg Tablet  Commonly known as: CEFTIN   500 mg, Oral, 2  TIMES DAILY  Qty: 20 Tablet  Refills: 0     Lisinopril 30 mg Tablet  Commonly known as: PRINIVIL   30 mg, Oral, DAILY  Qty: 30 Tablet  Refills: 0     naloxone 4 mg/actuation Spray, Non-Aerosol  Commonly known as: NARCAN   1 Spray, INTRANASAL, EVERY 2 MIN PRN, for actual or suspected opioid overdose. Call 911 if used.  Qty: 2 Each  Refills: 2     omeprazole 20 mg Capsule, Delayed Release(E.C.)  Commonly known as: PRILOSEC   20 mg, DAILY  Refills: 0     oxyCODONE-acetaminophen 10-325 mg tablet  Commonly known as: PERCOCET   1 Tablet, Oral, EVERY 6 HOURS PRN  Qty: 12 Tablet  Refills: 0            ASK your doctor about these medications.        Details   folic acid 1 mg Tablet  Commonly known as: FOLVITE  Ask about: Should I take this medication?   1 mg, Oral, DAILY  Qty: 30 Tablet  Refills: 0      pyridoxine 100 mg Tablet  Commonly known as: VITAMIN B6  Ask about: Should I take this medication?   100 mg, Oral, DAILY  Qty: 30 Tablet  Refills: 0     thiamine mononitrate 100 mg Tablet  Ask about: Should I take this medication?   100 mg, Oral, DAILY  Qty: 30 Tablet  Refills: 0

## 2022-05-13 NOTE — ED Nurses Note (Signed)
Patient discharged home.  AVS reviewed with patient.  A written copy of the AVS and discharge instructions was given to the patient.  Questions sufficiently answered as needed.  Patient encouraged to follow up with PCP as indicated.  In the event of an emergency, patient/care giver instructed to call 911 or go to the nearest emergency room. IV removed and pressure dressing applied. Pt left department via wheelchair.

## 2022-05-13 NOTE — Discharge Instructions (Signed)
It is extremely important to follow-up with a hand surgeon within 1 week.  If you can not see a hand surgeon within 1 week, return to the ER in 3 days for a suture rechecked.    Return to the ER at any time if there are any problems   See an orthopedic surgeon from list provided below for the left ankle fracture   Keep the left ankle boot on to help it heal  Percocet every 6 hours as needed for severe pain.  Do not do any other illicit drugs or drink any alcohol while taking this medication.  Ceftin twice  a day for 10 days  Keep the splint on your finger.     Orthopedic surgery -- NOT HAND  [facility] -- Dr. Marcelino Scot, Dr. Carin Primrose and Dr. Nicole Cella  [phone]  [place], [place] [address]    Mervyn Skeeters   [address], [place], [place] [address]  Phone: [phone]    HAND SURGERY- this is the main number. They will direct you to hand surgery.   [phone] in [place].

## 2022-05-13 NOTE — ED Nurses Note (Signed)
[facility]  Peer Recovery Coach Assessment    Initial Evaluation  Referred by:: Nurse  Location of Evaluation: Emergency Department  How many times in the last 12 months have you been to the ED?: 6 or more  Have you ever served or are you currently serving in the [employer]?: Yes  In which branch of the [employer]?: [employer]          Substance Use History  Patient current substance use status: Patient states that he drinks around 2 pints of alcohol and uses dilaudid 20-40mg s IV daily and has been doing so since he was around the age of 52.    Prior treatment history?: No         Within the last 30 days, what substances has the patient used?: Alcohol, Opiates  Age of first opiate use: 52 years old  When was the patient's last use of opiates/heroin?: 0-24 hours  Patient's age at first substance use?: 15-20  Drug route of administration: Injected         Does patient want opiate use treatment?: No         Family, Social, Home & Safety History  Marital Status: Single    Number of children: 0                 Current living situation: Engineer, agricultural phone number for the patient: [phone]                 Employment  Current employment status: Disabled         Scientist, research (medical): 1  Summary of assessment priority areas: comments: Patient denies any kind of help or treatment optioins from [facility], he stated that he does not want help today, tomorrow, or next week, that he will never want to stop using opiates and never entertains the idea.    Brief Intervention  Discussed plan to reduce/quit substance use?: No  Discussed willingness to enter treatment?: No  Indicated patient's stage of change:: 1 - Precontemplation    Patient seen by Peer Recovery Coach and is a candidate for buprenorphine administration in the ED. Patient needs assessment for bup treatment.: No    Plan  Was the patient referred to treatment?: No    Was patient referred to physician for Buprenorphine Assessment  in the ED?: No    Did patient receive Narcan in the ED?: No         Follow-up           Need for additional follow-up?: Yes       Deliah Goody, Peer Recovery Coach 05/13/2022 16:55

## 2022-05-13 NOTE — ED Triage Notes (Signed)
EMS reports right hand and left ankle pain after a fall. Just d/ced from [facility] ED . Reports did not pick up his pain meds . VSS en route

## 2022-05-13 NOTE — ED Provider Notes (Signed)
[facility]  ED Primary Provider Note  Patient Name: Steven Parrish  Patient Age: 52 y.o.  Date of Birth: [birth year]    Chief Complaint: Fall        History of Present Illness       Tina Wlodarczyk is a 52 y.o. male who had concerns including Fall.     HPI      CHIEF COMPLAINT FALL.      HPI   Areas of injury:  Left ankle, right 3rd finger   Severity 10/10   Concerns:  The right hand shows a open fracture/dislocation of the right 3rd finger at the PIP  Barriers to communication and help:  The patient is very belligerent at this time and for a very long time refused to let us help him.  Mechanism:  Patient states he fell down a wet grass he will landing on his right hand as well as twisting his left ankle  Additional symptoms:  Patient denies any other injuries.  He denies any head trauma denies any neck pain.  However I can not clear his cervical spine or head without further imaging due to nexus criteria.  The patient denies any nausea vomiting or diarrhea.  He denies any new back pain.  He is mainly complaining of left ankle pain regardless of his right hand.  Vaccinations:  I can not verify tetanus vaccination  Allergies:  Patient can not take triple antibiotic or sulfa.  PMH: reviewed and listed on the chart below.   SH: reviewed and listed on the chart below.   Social History: reviewed and listed on the chart below.   Family History: reviewed and listed on the chart below.       PMHx:    Past Medical History:   Diagnosis Date    Alcoholic liver disease (CMS [place])     Cirrhosis of liver (CMS HCC)     Esophageal and gastric varices (CMS HCC)      Esophageal hernia     Essential hypertension     IV drug abuse (CMS HCC)     Peptic ulcer     PSHx:   Past Surgical History:   Procedure Laterality Date    HX CHOLECYSTECTOMY      NOSE SURGERY      SINUS SURGERY         Allergies:    Allergies   Allergen Reactions    Chocolate [Cocoa]     Sulfa (Sulfonamides)  Other Adverse Reaction  (Add comment)     Can't move    Triple Antibiotic [Neomy-Bacit-Polymyx-Pramoxine] Hives/ Urticaria    Social History  Social History     Tobacco Use    Smoking status: Every Day     Current packs/day: 1.00     Types: Cigarettes    Smokeless tobacco: Former     Types: Snuff   Vaping Use    Vaping status: Never Used   Substance Use Topics    Alcohol use: Yes     Comment: 5-6 pints per day    Drug use: Yes     Types: Opioid     Comment: dilaudid       Family History  Family Medical History:    None            Home Meds:      Prior to Admission medications    Medication Sig Start Date End Date Taking? Authorizing Provider   folic acid (FOLVITE)  1 mg Oral Tablet Take 1 Tablet (1 mg total) by mouth Once a day for 30 days 03/31/22 04/30/22  Virl Cagey, MD   lisinopriL (PRINIVIL) 30 mg Oral Tablet Take 1 Tablet (30 mg total) by mouth Once a day  Patient not taking: Reported on 03/29/2022 06/27/21   Ouida Sills, MD   naloxone [facility]) 4 mg per spray nasal spray 1 Spray by INTRANASAL route Every 2 minutes as needed for actual or suspected opioid overdose. Call 911 if used.  Patient not taking: Reported on 04/28/2022 03/29/22   Eustace Quail, MD   omeprazole (PRILOSEC) 20 mg Oral Capsule, Delayed Release(E.C.) Take 1 Capsule (20 mg total) by mouth Once a day  Patient not taking: Reported on 04/28/2022    Provider, Historical   pyridoxine, vitamin B6, (VITAMIN B6) 100 mg Oral Tablet Take 1 Tablet (100 mg total) by mouth Once a day for 30 days 03/31/22 04/30/22  Virl Cagey, MD   thiamine mononitrate 100 mg Oral Tablet Take 1 Tablet (100 mg total) by mouth Once a day for 30 days  Patient not taking: Reported on 04/28/2022 03/31/22 04/30/22  Virl Cagey, MD          ROS: A total of 10 systems were reviewed by me at the time of the visit. All are negative except the HPI and the following.           Physical Exam   ED Triage Vitals [05/13/22 1521]   BP (Non-Invasive) (!) 149/98   Heart Rate 97   Respiratory Rate  20   Temperature 36.2 C (97.2 F)   SpO2 97 %   Weight 127 kg (280 lb)   Height          Physical exam  Constitutional/general: The nursing notes were reviewed and agreed upon.  The vital signs were reviewed and are listed on the chart.   Belligerent 52 year old male.  He is currently in  in no acute distress.  Patient appears to be in pain.  However he is screaming at this time and very belligerent towards staff.    HEENT: Eyes show normal extraocular movements.  Well-hydrated oral mucosa is noted.  There is no facial trauma or abnormalities.  Patient has poor dentition  Neck: No midline tenderness, no meningeal signs.  Full range of motion.  No JVD.    Cardiovascular: Heart is regular rate and rhythm S1-S2 sounds were auscultated without murmur click or rub  Respiratory: Lungs are clear to auscultation in all fields without wheeze rale or rhonchi.  GI: Abdomen is soft non tender normal bowel sounds are auscultated.  There is no rebound tenderness or guarding  Neuro cranial nerves II through XII are intact and normal.  Patient has normal speech and normal gait.  There is no muscle weakness in any extremities.  Sensation is intact throughout.   Psych: Patient is alert and oriented person place and time.  Patient is very pleasant converse with has a euthymic affect.  There are no signs of depression or anxiety.  Skin: No rash.  No petechiae or purpura.  The skin is warm and dry without diaphoresis.  There is no pallor.  Musculoskeletal: There is no tenderness to palpation in any body region.  There is no lower extremity edema and no calf tenderness.  Negative Homan's sign bilaterally.  GU: Deferred                Fracture Reduction    Date/Time: 05/13/2022 6:16 PM  Performed by: Darryll Capers, DO  Authorized by: Darryll Capers, DO    Consent:     Consent obtained:  Verbal and written    Consent given by:  Patient    Risks, benefits, and alternatives were discussed: yes      Risks discussed:  Nerve damage, pain and  vascular damage    Alternatives discussed:  Referral (to tertiary care center, patient refused.)  Universal protocol:     Site/side marked: yes      Immediately prior to procedure, a time out was called: no      Patient identity confirmed:  Verbally with patient and arm band  Injury:     Injury location:  Finger    Finger injury location:  R long finger (no fracture. dislocated at PIP)    Finger fracture type comment:  No fracture, only dislocated at pip    IP joint involved: yes (PIP)    Pre-procedure details:     Distal neurologic exam:  Numbness    Distal perfusion: distal pulses strong and brisk capillary refill      Range of motion: reduced    Sedation:     Sedation type:  None  Anesthesia:     Anesthesia method:  Local infiltration    Local anesthetic:  Lidocaine 2% w/o epi (digital block. No blood aspirated before injection)  Procedure details:     Manipulation performed: yes      Skin traction used: no      Skeletal traction used: yes      Pin inserted: no      Reduction successful: yes      X-ray confirmed reduction: yes      Immobilization:  Splint    Splint type:  Finger    Supplies used:  Aluminum splint    Additional details:  Bacitracin and nonstick dressing    Attestation: Splint applied and adjusted personally by me    Post-procedure details:     Distal neurologic exam:  Normal    Distal perfusion: distal pulses strong and brisk capillary refill      Range of motion: not applicable      Range of motion comment:  Immobilized    Procedure completion:  Tolerated well, no immediate complications    Definitive fracture management: Referred to hand surgeon in [place] at his request. [id number].    Lac Repair    Date/Time: 05/13/2022 6:21 PM    Performed by: Darryll Capers, DO  Authorized by: Darryll Capers, DO    Consent:     Consent obtained:  Verbal and written    Consent given by:  Patient    Risks, benefits, and alternatives were discussed: yes      Risks discussed:  Infection, pain, retained foreign body,  tendon damage, poor cosmetic result, need for additional repair, nerve damage, poor wound healing and vascular damage    Alternatives discussed:  Referral (patient refused immediate transfer to hand surgeon)  Universal protocol:     Procedure explained and questions answered to patient or proxy's satisfaction: yes      Imaging studies available: yes      Patient identity confirmed:  Verbally with patient and arm band  Anesthesia:     Anesthesia method:  Nerve block    Block location:  Digital block    Block needle gauge:  25 G    Block anesthetic:  Lidocaine 2% w/o epi    Block technique:  Digital block    Block injection  procedure:  Incremental injection, introduced needle, negative aspiration for blood, anatomic landmarks palpated and anatomic landmarks identified    Block outcome:  Anesthesia achieved  Laceration details:     Location:  Finger    Finger location:  R long finger    Length (cm):  2    Depth (mm):  4  Pre-procedure details:     Preparation:  Patient was prepped and draped in usual sterile fashion and imaging obtained to evaluate for foreign bodies  Exploration:     Limited defect created (wound extended): no      Hemostasis achieved with:  Direct pressure    Wound exploration: entire depth of wound visualized      Wound extent: fascia violated      Wound extent: no foreign bodies/material noted, no muscle damage noted, no tendon damage noted and no underlying fracture noted      Contaminated: no    Treatment:     Area cleansed with:  Chlorhexidine    Amount of cleaning:  Extensive    Irrigation solution:  Sterile saline    Irrigation method:  Syringe    Visualized foreign bodies/material removed: no      Debridement:  None    Undermining:  None    Scar revision: no    Skin repair:     Repair method:  Sutures    Suture size:  4-0    Suture material:  Nylon    Suture technique:  Simple interrupted  Approximation:     Approximation:  Loose  Repair type:     Repair type:  Simple  Post-procedure details:      Dressing:  Antibiotic ointment, non-adherent dressing and splint for protection    Procedure completion:  Tolerated well, no immediate complications        Patient Data     Labs Ordered/Reviewed   COMPREHENSIVE METABOLIC PANEL, NON-FASTING - Abnormal; Notable for the following components:       Result Value    CHLORIDE 109 (*)     ALKALINE PHOSPHATASE 310 (*)     ALT (SGPT) 62 (*)     AST (SGOT) 99 (*)     All other components within normal limits    Narrative:     Estimated Glomerular Filtration Rate (eGFR) is calculated using the CKD-EPI (2021) equation, intended for patients 75 years of age and older. If gender is not documented or "unknown", there will be no eGFR calculation.     ETHANOL, SERUM/PLASMA - Abnormal; Notable for the following components:    ETHANOL 204 (*)     All other components within normal limits   CBC WITH DIFF - Abnormal; Notable for the following components:    RBC 4.04 (*)     HGB 12.4 (*)     RDW 16.7 (*)     NEUTROPHIL % 39 (*)     LYMPHOCYTE % 45 (*)     All other components within normal limits   AMMONIA - Normal   CBC/DIFF    Narrative:     The following orders were created for panel order CBC/DIFF.  Procedure                               Abnormality         Status                     ---------                               -----------         ------  CBC WITH [id number]                Abnormal            Final result                 Please view results for these tests on the individual orders.   EXTRA TUBES    Narrative:     The following orders were created for panel order EXTRA TUBES.  Procedure                               Abnormality         Status                     ---------                               -----------         ------                     BLUE TOP [id number]                                    In process                 GOLD TOP [id number]                                    In process                 GRAY TOP [id number]                                     In process                   Please view results for these tests on the individual orders.   BLUE TOP TUBE   GOLD TOP TUBE   GRAY TOP TUBE       BMP:   142 (04/03 1602) 109* (04/03 1602) 9 (04/03 1602)    /     89 (04/03 1602)   3.6 (04/03 1602) 26 (04/03 1602) 0.68 (04/03 1602) \              CBC:     5.9 (04/03 1602) \   12.4* (04/03 1602) /   250 (04/03 1602)      / 37.8 (04/03 1602) \             XR AP MOBILE CHEST   Final Result by Edi, Radresults In (04/03 1620)      Lung volumes are low   Heart size is borderline and unchanged   Mild opacities at the bases likely atelectasis   No large areas of consolidation   No edema or effusion                     Radiologist location ID: [id number]         XR ANKLE LEFT 2 VIEW   Final Result by Edi, Radresults In (04/03 1619)   Lateral malleolar fracture  Soft tissue swelling            Radiologist location ID: [id number]         XR HAND RIGHT   Final Result by Edi, Radresults In (04/03 1622)   Dislocated PIP joint middle finger                Radiologist location ID: [place]  The patient has been very belligerent to staff multiple times.  He has been belligerent towards me and has been using very foul language.  To this regard I had a very serious conversation with him about his disposition and foul language towards my staff.  This is actually very threatening to my nursing staff.  He was initially going to leave against medical advice with an open dislocated right middle finger.  I explained to him that I wanted to help him and that I needed to numb his finger up as a digital block.  I explained this in simple layman's terms and he is now agreeable to me helping.  Currently, his blood alcohol is 200.  However he is oriented enough to actually Dilaudid telephone and speak to someone.  I feel that he is intoxicated but able to make decisions at this time.  Regardless, this needs to  be emergently reduced and repaired.  He can then follow-up with a hand surgeon as an outpatient.  Note that my orthopedic surgeons do not have privileges for hands.  Doctor Higginbotham is on-call and I did reach out to him.      Doctor Higginbotham advised to reduce the finger here in the emergency department.  He states that I should loosely approximate the skin and let the patient follow-up as an outpatient with the hand surgeon.  If it can not be reduced he advises to send the patient to a hand surgeon directly.    We have had multiple episodes of the patient being belligerent to the nursing staff and myself.  It escalated to the point that I asked the [facility] police to come in calm the patient down.  At this time he is agreeable for me to fix his finger.  There is no one else give me permission so he is signing the consent form.  However regardless of consent this is an emergent situation as he could lose his finger if it has not repaired.    Interventions: Patient received 2 g of Ancef upon arrival.  Patient received fentanyl 100 mcg IV in addition to what he already received in route.  The patient has right 3rd finger was reduced.  The right 3rd finger laceration was repaired loosely with 3 interrupted sutures after the reduction.  Patient received Dilaudid 1 mg IV.  Patient received a discussion with local police officers.    Consultation: I consulted with the patient extensively about his lifestyle.  He agrees that he will seek out help.  He also wants a referral to a local church which offer [facility] which is a [place] based [place].  The closest to [place] [facility] [address] in [place]    Problems Addressed:  Alcoholic intoxication without complication (CMS [facility]): acute illness or injury  Alcoholism (CMS [facility]): chronic illness or injury  Dislocation of finger, initial encounter: acute illness or injury  Elevated LFTs: acute illness or injury  Open dislocation:  acute  illness or injury  Opiate abuse, continuous (CMS [place]): chronic illness or injury  Other closed fracture of distal end of left fibula, initial encounter: acute illness or injury  Verbally abusive behavior: acute illness or injury    Amount and/or Complexity of Data Reviewed  Labs: ordered. Decision-making details documented in ED Course.  Radiology: ordered. Decision-making details documented in ED Course.    Risk  Prescription drug management.              ED Course as of 05/13/22 1826   Wed May 13, 2022   1629 WBC: 5.9   1629 HGB(!): 12.4   1629 HCT: 37.8   1629 PLATELET COUNT: 250   1629 PMN'S(!): 39   1630 XR HAND RIGHT   1632 CT CERVICAL SPINE WO IV CONTRAST   1637 ALKALINE PHOSPHATASE(!): 310   1638 ALT (SGPT)(!): 62   1638 AST (SGOT)(!): 99   1638 ETHANOL, SERUM(!): 204   1638 AMMONIA: 29   1638 SODIUM: 142   1638 POTASSIUM: 3.6   1638 CHLORIDE(!): 109   1638 CARBON DIOXIDE: 26   1638 BUN: 9   1638 CREATININE: 0.68   1638 WBC: 5.9   1638 HGB(!): 12.4   1638 HCT: 37.8   1638 PLATELET COUNT: 250   1638 PMN'S(!): 39   1638 XR HAND RIGHT   1638 XR AP MOBILE CHEST   1638 XR ANKLE LEFT 2 VIEW   1638 CT CERVICAL SPINE WO IV CONTRAST   1638 CT BRAIN WO IV CONTRAST         Medications Administered in the ED   diphtheria, pertussis-acell, tetanus (BOOSTRIX) IM injection (0 mL IntraMUSCULAR Not Given 05/13/22 1600)   fentaNYL (SUBLIMAZE) 50 mcg/mL injection (100 mcg Intravenous Given 05/13/22 1547)   ondansetron (ZOFRAN) 2 mg/mL injection (4 mg Intravenous Given 05/13/22 1547)   ceFAZolin (ANCEF) 2 g in NS 50 mL IVPB minibag (0 g Intravenous Stopped 05/13/22 1615)   lidocaine 2% injection (300 mg Intradermal Given 05/13/22 1704)   HYDROmorphone (DILAUDID) 2 mg/mL injection (1 mg Intravenous Given 05/13/22 1703)   bacitracin 500 units/gram topical ointment tube ( Topical Given 05/13/22 1900)                 Clinical Impression   Open dislocation (Primary)   Dislocation of finger, initial encounter   Other closed fracture of distal end  of left fibula, initial encounter   Alcoholic intoxication without complication (CMS HCC)   Alcoholism (CMS HCC)   Opiate abuse, continuous (CMS HCC)   Elevated LFTs   Verbally abusive behavior           Discharged      Current Discharge Medication List        START taking these medications    Details   cefuroxime (CEFTIN) 500 mg Oral Tablet Take 1 Tablet (500 mg total) by mouth Twice daily for 10 days  Qty: 20 Tablet, Refills: 0      oxyCODONE-acetaminophen (PERCOCET) 10-325 mg Oral tablet Take 1 Tablet by mouth Every 6 hours as needed for Pain (DO NOT GIVE PERCOCET WITHOUT CEFTIN) for up to 12 days  Qty: 12 Tablet, Refills: 0             SOMEONE MUST SIGN FOR PATIENT SINCE ELEVATED ALCOHOL          _______________________________  Darryll Capers, D.O.  Emergency Medicine  [place]          This note  may have been partially generated using MModal Fluency Direct system, and there may be some incorrect words, spellings, and punctuation that were not noted in checking the note before saving, though effort was made to avoid such errors.

## 2022-05-13 NOTE — ED Triage Notes (Signed)
Fall etoh abuse open fx rt middle finger , left ankle pain

## 2022-05-13 NOTE — ED Nurses Note (Signed)
Patient received to room, placed on monitor including BP, O2.  Patient reports drinking 1 liter of etoh today while fishing then falling down a hill. PL 10/10 left ankle and right hand. Deformities noted to both injured sites. Patient reports drug and alcohol abuse daily. Assessment and history completed. Call light in reach, encouraged to call for any needs. Awaiting providers orders at this time.

## 2022-05-14 MED ORDER — ACETAMINOPHEN 325 MG TABLET
650.0000 mg | ORAL_TABLET | ORAL | Status: DC
Start: 2022-05-14 — End: 2022-05-14
  Administered 2022-05-14: 0 mg via ORAL

## 2022-05-14 MED ORDER — KETOROLAC 30 MG/ML (1 ML) INJECTION SOLUTION
INTRAMUSCULAR | Status: AC
Start: 2022-05-14 — End: 2022-05-14
  Filled 2022-05-14: qty 1

## 2022-05-14 MED ORDER — BUPRENORPHINE HCL 0.3 MG/ML INJECTION SOLUTION
0.3000 mg | Freq: Four times a day (QID) | INTRAMUSCULAR | Status: DC | PRN
Start: 2022-05-14 — End: 2022-05-14
  Administered 2022-05-14: 0.3 mg via INTRAMUSCULAR

## 2022-05-14 MED ORDER — ACETAMINOPHEN 325 MG TABLET
ORAL_TABLET | ORAL | Status: AC
Start: 2022-05-14 — End: 2022-05-14
  Filled 2022-05-14: qty 2

## 2022-05-14 MED ORDER — TETRACAINE 0.5 % EYE DROPS
OPHTHALMIC | Status: AC
Start: 2022-05-14 — End: 2022-05-14
  Filled 2022-05-14: qty 100

## 2022-05-14 MED ORDER — BUPRENORPHINE HCL 0.3 MG/ML INJECTION SOLUTION
INTRAMUSCULAR | Status: AC
Start: 2022-05-14 — End: 2022-05-14
  Filled 2022-05-14: qty 1

## 2022-05-14 MED ORDER — KETOROLAC 30 MG/ML (1 ML) INJECTION SOLUTION
15.0000 mg | INTRAMUSCULAR | Status: AC
Start: 2022-05-14 — End: 2022-05-14
  Administered 2022-05-14: 15 mg via INTRAVENOUS

## 2022-05-14 NOTE — ED Nurses Note (Signed)
Patient agreed to dressing and finger splint.  IV DC. Cath intact.  No redness or edema at site.  Verbalized understanding of discharge instructions and follow up information.  Assisted to car via wheelchair. VSS

## 2022-05-14 NOTE — ED Nurses Note (Signed)
Pt refused tylenol and refuses right middle finger dressing.

## 2022-05-14 NOTE — Discharge Instructions (Signed)
Your evaluation did not reveal critical condition requiring hospitalization.  You alternating Tylenol and ibuprofen every 4-6 hours.  Take the antibiotics previously prescribed and to completion.  You need to see orthopedics ideally within the next day or 2.  Return to emergency department sooner if you have uncontrollable fevers chills or any other concerning symptoms.  Thank you for visiting [place].

## 2022-05-23 ENCOUNTER — Emergency Department: Admission: EM | Admit: 2022-05-23 | Discharge: 2022-05-23 | Discharge status: Against Medical Advice | Payer: 59

## 2022-05-23 DIAGNOSIS — Z5321 Procedure and treatment not carried out due to patient leaving prior to being seen by health care provider: Secondary | ICD-10-CM | POA: Insufficient documentation

## 2022-05-23 NOTE — ED Triage Notes (Signed)
d 

## 2022-05-23 NOTE — ED Nurses Note (Signed)
Patient left before triage complete stated wanted a nerve block and I informed him we do not do nerve blocks and patient stated he had one done at [facility].  Nurse verified with physician and informed patient physician would not do nerve block.  Patient ambulated out of triage and stated Thank you.

## 2022-05-27 ENCOUNTER — Encounter (HOSPITAL_BASED_OUTPATIENT_CLINIC_OR_DEPARTMENT_OTHER): Payer: Self-pay

## 2022-05-27 ENCOUNTER — Emergency Department: Admission: EM | Admit: 2022-05-27 | Discharge: 2022-05-27 | Discharge status: Against Medical Advice | Payer: 59

## 2022-05-27 ENCOUNTER — Other Ambulatory Visit: Payer: Self-pay

## 2022-05-27 DIAGNOSIS — Z5321 Procedure and treatment not carried out due to patient leaving prior to being seen by health care provider: Secondary | ICD-10-CM | POA: Insufficient documentation

## 2022-05-27 NOTE — ED Triage Notes (Signed)
Pt c/o right middle finger pain. He states he just had a broken bone in the right middle finger reset and stitches placed and he was arrested by [facility] and one of the officers squeezed  his middle finger injuring it further. He c/o severe pain right middle finger and swelling

## 2022-05-27 NOTE — ED Nurses Note (Signed)
PT not in lobby at this time.

## 2022-05-27 NOTE — ED Nurses Note (Signed)
PT still not in lobby at this time. Will check outside prior to removing pt for left after triage without being seen.

## 2022-05-28 NOTE — ED Nurses Note (Signed)
[place] [facility], [facility] Emergency Department  Peer Recovery Coach Assessment    Initial Evaluation  Referred by:: Nurse  Location of Evaluation: Emergency Department  How many times in the last 12 months have you been to the ED?: 4  Have you ever served or are you currently serving in the Armed Forces?: No             Substance Use History  Patient current substance use status: Patientn stated he uses dilaudid intravenously. Patient also stated he drinks a pint or 2 of liquor daily.    Prior treatment history?: No    Currently enrolled in substance use program?: No    Within the last 30 days, what substances has the patient used?: Alcohol, Opiates  Age of first opiate use: 52 years old  When was the patient's last use of opiates/heroin?: 0-24 hours  Patient's age at first substance use?: 7 or older  Drug route of administration: Injected, Oral    Has the patient ever had sustained abstinence?: No    Does patient want opiate use treatment?: No         Family, Social, Home & Safety History  Marital Status: Single    Number of children: 0       Need to improve relationships with family?: Yes    Social network: Substance using peers    Current living situation: Independent  Any help needed with the following?: None  Contact phone number for the patient: [phone]  Emergency contact name and phone number: None LIsted    Has the patient had any legal issues within the past 30 days?: None         Employment  Current employment status: Unemployed    Needs vocational training?: No  Needs assistance with job search?: No    Engagement  Readiness ruler: 1  Summary of assessment priority areas: Substance abuse treatment    Brief Intervention  Discussed plan to reduce/quit substance use?: Yes  Discussed willingness to enter treatment?: Yes  Indicated patient's stage of change:: 1 - Precontemplation    Patient seen by Peer Recovery Coach and is a candidate for buprenorphine administration in the  ED. Patient needs assessment for bup treatment.: Yes    Plan  Was the patient referred to treatment?: No    Was patient referred to physician for Buprenorphine Assessment in the ED?: No (Patient staetd he did not want any help at all.)    Did patient receive Narcan in the ED?: No         Follow-up  Patient admitted for treatment?: No        Need for additional follow-up?: Yes  Additional comments: Discussed harm reduction with patient and long-term affects of substance use.    Haynes Bast, Peer Recovery Coach 05/28/2022 10:20

## 2022-05-30 ENCOUNTER — Other Ambulatory Visit: Payer: Self-pay

## 2022-05-30 ENCOUNTER — Emergency Department (HOSPITAL_BASED_OUTPATIENT_CLINIC_OR_DEPARTMENT_OTHER): Payer: 59

## 2022-05-30 ENCOUNTER — Emergency Department
Admission: EM | Admit: 2022-05-30 | Discharge: 2022-05-30 | Disposition: A | Discharge status: Home / Self Care | Payer: MEDICAID | Attending: FAMILY PRACTICE | Admitting: FAMILY PRACTICE

## 2022-05-30 ENCOUNTER — Encounter (HOSPITAL_BASED_OUTPATIENT_CLINIC_OR_DEPARTMENT_OTHER): Payer: Self-pay

## 2022-05-30 DIAGNOSIS — Z4802 Encounter for removal of sutures: Secondary | ICD-10-CM

## 2022-05-30 DIAGNOSIS — Z79899 Other long term (current) drug therapy: Secondary | ICD-10-CM | POA: Insufficient documentation

## 2022-05-30 DIAGNOSIS — F1010 Alcohol abuse, uncomplicated: Secondary | ICD-10-CM

## 2022-05-30 DIAGNOSIS — I10 Essential (primary) hypertension: Secondary | ICD-10-CM | POA: Insufficient documentation

## 2022-05-30 DIAGNOSIS — K7030 Alcoholic cirrhosis of liver without ascites: Secondary | ICD-10-CM | POA: Insufficient documentation

## 2022-05-30 DIAGNOSIS — Z720 Tobacco use: Secondary | ICD-10-CM

## 2022-05-30 DIAGNOSIS — F1020 Alcohol dependence, uncomplicated: Secondary | ICD-10-CM | POA: Insufficient documentation

## 2022-05-30 DIAGNOSIS — T783XXA Angioneurotic edema, initial encounter: Secondary | ICD-10-CM | POA: Insufficient documentation

## 2022-05-30 MED ORDER — FAMOTIDINE 20 MG TABLET
ORAL_TABLET | ORAL | Status: AC
Start: 2022-05-30 — End: 2022-05-30
  Filled 2022-05-30: qty 1

## 2022-05-30 MED ORDER — CETIRIZINE 10 MG TABLET
10.0000 mg | ORAL_TABLET | Freq: Every day | ORAL | 0 refills | Status: AC
Start: 2022-05-30 — End: 2022-06-13

## 2022-05-30 MED ORDER — FAMOTIDINE 20 MG TABLET
20.0000 mg | ORAL_TABLET | ORAL | Status: AC
Start: 2022-05-30 — End: 2022-05-30
  Administered 2022-05-30: 20 mg via ORAL

## 2022-05-30 MED ORDER — FAMOTIDINE 20 MG TABLET
20.0000 mg | ORAL_TABLET | Freq: Two times a day (BID) | ORAL | 0 refills | Status: AC
Start: 2022-05-30 — End: 2022-06-13

## 2022-05-30 MED ORDER — AMLODIPINE 5 MG TABLET
5.0000 mg | ORAL_TABLET | Freq: Every day | ORAL | 0 refills | Status: AC
Start: 2022-05-30 — End: 2022-06-29

## 2022-05-30 NOTE — ED Triage Notes (Addendum)
Pt states, "I started taking my Lisinopril last night and I woke up this morning and my top lip was swollen."  Top lip swollen. Respirations even and unlabored.

## 2022-05-30 NOTE — Discharge Instructions (Signed)
You appear to have angioedema of the lip, since you re-initiated the lisinopril and 1 dose last night as you presents today with swelling of the lip I recommend you not take anymore lisinopril.  I have given you a prescription for amlodipine to take for your high blood pressure, take this medication in the evenings.  Need to call follow-up with your primary care provider regarding continued treatment and therapy for hypertension.    I removed the sutures from your finger this morning, with the swelling you likely have some damage to the muscles and tendons in the proximal midportion of the ring finger from the recent dislocation.  You will need see your primary care provider or Dr. Lequita Halt, orthopedic surgeon, for referral to occupational therapy of the hand.    Recommend you stop smoking, reduced.  Your alcohol abuse as well as this can contribute to other health problems such as cirrhosis, heart attack, stroke, kidney failure, cancers and bleeding.

## 2022-05-30 NOTE — Procedures (Signed)
Procedures:  Suture removal   Indication: Healed laceration right 4th finger, proximal phalanx   Procedure:  2 sutures noted, alcohol use as an antiseptic, sutures cut near the inline and removed without difficulty in their entirety.    Complications:  None    Sondra Come, DO   4/20/202408:29

## 2022-05-30 NOTE — ED Nurses Note (Signed)
Patient discharged home with family.  AVS reviewed with patient/care giver.  A written copy of the AVS and discharge instructions was given to the patient/care giver.  Questions sufficiently answered as needed.  Patient/care giver encouraged to follow up with PCP as indicated.  In the event of an emergency, patient/care giver instructed to call 911 or go to the nearest emergency room.

## 2022-05-30 NOTE — ED Nurses Note (Signed)
[place] [facility], [facility] Emergency Department  Peer Recovery Coach Assessment    Initial Evaluation  Referred by:: Nurse  Location of Evaluation: Emergency Department  How many times in the last 12 months have you been to the ED?: 4  Have you ever served or are you currently serving in the Armed Forces?: No             Substance Use History  Patient current substance use status: Patient stated he uses dilaudid intravenously, daily and drinks 1-2 pints of liquor daily.    Prior treatment history?: No    Currently enrolled in substance use program?: No    Within the last 30 days, what substances has the patient used?: Opiates, Alcohol  Age of first opiate use: 52 years old  When was the patient's last use of opiates/heroin?: 0-24 hours  Patient's age at first substance use?: 52 or older  Drug route of administration: Inhaled, Oral    Has the patient ever had sustained abstinence?: No    Does patient want opiate use treatment?: No         Family, Social, Home & Safety History  Marital Status: Single    Number of children: 0       Need to improve relationships with family?: Yes    Social network: Substance using peers    Current living situation: Independent  Any help needed with the following?: None  Contact phone number for the patient: [phone]  Emergency contact name and phone number: None LIsted    Has the patient had any legal issues within the past 30 days?: None         Employment  Current employment status: Unemployed    Needs vocational training?: No  Needs assistance with job search?: No    Engagement  Readiness ruler: 1  Summary of assessment priority areas: Substance abuse treatment    Brief Intervention  Discussed plan to reduce/quit substance use?: Yes  Discussed willingness to enter treatment?: Yes  Indicated patient's stage of change:: 1 - Precontemplation    Patient seen by Peer Recovery Coach and is a candidate for buprenorphine administration in the ED. Patient needs  assessment for bup treatment.: Yes    Plan  Was the patient referred to treatment?: No    Was patient referred to physician for Buprenorphine Assessment in the ED?: No    Did patient receive Narcan in the ED?: No         Follow-up  Patient admitted for treatment?: No        Need for additional follow-up?: Yes  Additional comments: Discusse harm reduction    Haynes Bast, Peer Recovery Coach 05/30/2022 08:40

## 2022-05-30 NOTE — ED Provider Notes (Signed)
[place] [facility], [facility] Emergency Department  ED Primary Provider Note  History of Present Illness   Chief Complaint   Patient presents with    Lip Swelling     Steven Parrish is a 52 y.o. male who had concerns including Lip Swelling.  Arrival: The patient arrived by Car    This 52 year old male patient emergency department lip, does have a history of hypertension is taking lisinopril in the past, has not been taking it until last night and he woke up this morning with a swollen upper lip and some soreness and IV his mouth.  He denies any throat swelling, wheezing, dyspnea or chest tightness associated with this.  He was also complaining of pain in the right ring finger as well, with a healed laceration and 2 sutures in place in the palmar aspect of the 1st phalanx.  Does have swelling in his area as well and he was complaining of pain and wants something done for the pain.  Review of the radiographs after the dislocation of the finger that was reduced in the ER on the 3rd of April shows a very small chip or avulsion fracture of either the distal 4th metacarpal or the proximal 4th 1st phalanx.  He was no other complaints, wants the sutures out in once to know what he can do for the pain with the finger.  He was blood pressure is elevated today, heart rates also elevated some at 117 with a 20 respiratory rate 96% sats on room air and afebrile in triage.  He does have the odor of alcohol, and admits to being an alcoholic.  Past medical history significant for alcohol abuse, essential hypertension, alcoholic cirrhosis, ulcers, and drug abuse.  Past surgical history includes cholecystectomy.  He does smoke, does drink beer but denies illicit drug use or marijuana use.    Patient denies having the influenza vaccine, pneumococcal vaccine, or COVID vaccines.  He was had COVID twice.      History Reviewed This Encounter:  Patient's past medical, surgical, social history reviewed  noted.    Physical Exam   ED Triage Vitals [05/30/22 0744]   BP (Non-Invasive) (!) 156/112   Heart Rate (!) 117   Respiratory Rate 20   Temperature 36.7 C (98.1 F)   SpO2 96 %   Weight    Height      Physical Exam  General:  No acute distress nontoxic   Ears:  TMs clear throat for traction   Nasal:  Boggy mucosa, small amount of white mucus, patent bilaterally and moist.    Oral:  Does have upper lip erythema and swelling suggestive of angioedema.    Pharynx:  Widely patent without swelling.  Neck:  Supple   Lungs:  Clear symmetrical with good aeration   Heart: Regular rate rhythm without murmur gallop   Abdomen:  Soft normal bowel sounds nontender   Extremities:  Moving symmetrically, he does have a cast on the left lower leg and foot.  Right hand:  4th finger, palmar aspect of the 1st phalanx has a transverse laceration in the mid phalanx area, and swelling of the 1st phalanx as well.  Laceration appears fairly well approximated, 2 sutures noted.     Patient Data   Labs Ordered/Reviewed - No data to display  No orders to display     Medical Decision Making        Medical Decision Making  Moderate complexity straightforward differential includes uncontrolled hypertension, angioedema  and he was stopped lisinopril, or write him a prescription for amlodipine, he was stay on Zyrtec and Pepcid for 10 days.  Sutures were removed from the right 4th finger, he was extension is other home medications, alcohol use/abuse was discussed and the need for assistance in helping cessation.  He was a call follow-up with his primary care provider, or orthopedist for the finger, he will likely need occupational therapy for the hand and finger.  Radiographs does show a small avulsion type radial opacity in the palmar aspect of the 4th distal metacarpal/proximal phalanx of the right hand.    Risk  OTC drugs.                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

## 2022-05-31 ENCOUNTER — Telehealth (HOSPITAL_BASED_OUTPATIENT_CLINIC_OR_DEPARTMENT_OTHER): Payer: Self-pay

## 2022-06-03 ENCOUNTER — Telehealth (HOSPITAL_BASED_OUTPATIENT_CLINIC_OR_DEPARTMENT_OTHER): Payer: Self-pay

## 2022-06-04 ENCOUNTER — Telehealth (HOSPITAL_BASED_OUTPATIENT_CLINIC_OR_DEPARTMENT_OTHER): Payer: Self-pay

## 2022-06-04 NOTE — ED Nurses Note (Signed)
Follow up 3. No answer, left message for call back.

## 2022-06-09 ENCOUNTER — Other Ambulatory Visit: Payer: Self-pay

## 2022-06-09 ENCOUNTER — Emergency Department
Admission: EM | Admit: 2022-06-09 | Discharge: 2022-06-09 | Disposition: A | Discharge status: Home / Self Care | Payer: Non-veteran care | Attending: Emergency Medicine | Admitting: Emergency Medicine

## 2022-06-09 ENCOUNTER — Encounter (HOSPITAL_BASED_OUTPATIENT_CLINIC_OR_DEPARTMENT_OTHER): Payer: Self-pay

## 2022-06-09 ENCOUNTER — Emergency Department (HOSPITAL_COMMUNITY): Admission: EM | Admit: 2022-06-09 | Payer: 59 | Source: Home / Self Care

## 2022-06-09 ENCOUNTER — Emergency Department (HOSPITAL_BASED_OUTPATIENT_CLINIC_OR_DEPARTMENT_OTHER): Payer: Non-veteran care

## 2022-06-09 DIAGNOSIS — S61318A Laceration without foreign body of other finger with damage to nail, initial encounter: Secondary | ICD-10-CM

## 2022-06-09 DIAGNOSIS — W298XXA Contact with other powered powered hand tools and household machinery, initial encounter: Secondary | ICD-10-CM | POA: Insufficient documentation

## 2022-06-09 DIAGNOSIS — S61211A Laceration without foreign body of left index finger without damage to nail, initial encounter: Secondary | ICD-10-CM | POA: Insufficient documentation

## 2022-06-09 DIAGNOSIS — Z23 Encounter for immunization: Secondary | ICD-10-CM | POA: Insufficient documentation

## 2022-06-09 DIAGNOSIS — S61218A Laceration without foreign body of other finger without damage to nail, initial encounter: Secondary | ICD-10-CM

## 2022-06-09 MED ORDER — IBUPROFEN 800 MG TABLET
ORAL_TABLET | ORAL | Status: AC
Start: 2022-06-09 — End: 2022-06-09
  Filled 2022-06-09: qty 1

## 2022-06-09 MED ORDER — IBUPROFEN 800 MG TABLET
800.0000 mg | ORAL_TABLET | ORAL | Status: AC
Start: 2022-06-09 — End: 2022-06-09
  Administered 2022-06-09: 800 mg via ORAL

## 2022-06-09 MED ORDER — DIPHTH,PERTUSSIS(ACEL),TETANUS 2.5 LF UNIT-8 MCG-5 LF/0.5ML IM SYRINGE
0.5000 mL | INJECTION | INTRAMUSCULAR | Status: AC
Start: 2022-06-09 — End: 2022-06-09
  Administered 2022-06-09: 0.5 mL via INTRAMUSCULAR

## 2022-06-09 MED ORDER — BACITRACIN 500 UNIT/G OINTMENT TUBE
TOPICAL_OINTMENT | CUTANEOUS | Status: AC
Start: 2022-06-09 — End: 2022-06-09

## 2022-06-09 MED ORDER — BACITRACIN 500 UNIT/G OINTMENT TUBE
TOPICAL_OINTMENT | CUTANEOUS | Status: AC
Start: 2022-06-09 — End: 2022-06-09
  Filled 2022-06-09: qty 28.4

## 2022-06-09 MED ORDER — IBUPROFEN 800 MG TABLET
800.0000 mg | ORAL_TABLET | Freq: Four times a day (QID) | ORAL | 0 refills | Status: AC | PRN
Start: 2022-06-09 — End: ?

## 2022-06-09 MED ORDER — DIPHTH,PERTUSSIS(ACEL),TETANUS 2.5 LF UNIT-8 MCG-5 LF/0.5ML IM SYRINGE
INJECTION | INTRAMUSCULAR | Status: AC
Start: 2022-06-09 — End: 2022-06-09
  Filled 2022-06-09: qty 0.5

## 2022-06-09 NOTE — ED Triage Notes (Signed)
Patient reports using a drill when it proceeded to go through his hand. Laceration noted to index finger of left hand.

## 2022-06-09 NOTE — ED Nurses Note (Signed)
Patient given written and verbal d/c instructions. All questions/concerns addressed. Informed to return with any concerns. Informed of x1 prescription at pharmacy. Verbalizes understanding of d/c instructions. Patient leaving ambulatory at this time.

## 2022-06-09 NOTE — ED Provider Notes (Signed)
[place] [facility], [facility] Emergency Department  ED Primary Provider Note  History of Present Illness   Chief Complaint   Patient presents with    Finger Laceration     Steven Parrish is a 52 y.o. male who had concerns including Finger Laceration.  Arrival: The patient arrived by Car complaining cutting his left 2nd finger on a Dremel tool earlier this morning.  Patient was at [facility] in her room but did feel like waiting so he left.  Patient then drove over here to be seen.  Patient denies any numbness or tingling in the finger.  Patient is able to move the finger in all directions.    HPI  Review of Systems   Review of Systems   Constitutional:  Positive for activity change. Negative for chills and fever.   HENT:  Negative for ear pain and sore throat.    Eyes:  Negative for pain and visual disturbance.   Respiratory:  Negative for cough and shortness of breath.    Cardiovascular:  Negative for chest pain and palpitations.   Gastrointestinal:  Negative for abdominal pain and vomiting.   Genitourinary:  Negative for dysuria and hematuria.   Musculoskeletal:  Negative for arthralgias and back pain.   Skin:  Positive for wound. Negative for color change and rash.   Neurological:  Negative for seizures and syncope.   All other systems reviewed and are negative.     Historical Data   History Reviewed This Encounter:     Physical Exam   ED Triage Vitals [06/09/22 1618]   BP (Non-Invasive) (!) 145/108   Heart Rate (!) 113   Respiratory Rate 17   Temperature 37.5 C (99.5 F)   SpO2 95 %   Weight 91.6 kg (202 lb)   Height 1.829 m (6')     Physical Exam  Vitals and nursing note reviewed.   Constitutional:       General: He is not in acute distress.     Appearance: He is well-developed. He is obese.   HENT:      Head: Normocephalic and atraumatic.      Right Ear: External ear normal.      Left Ear: External ear normal.      Nose: Nose normal.      Mouth/Throat:      Mouth: Mucous  membranes are dry.   Eyes:      Extraocular Movements: Extraocular movements intact.      Conjunctiva/sclera: Conjunctivae normal.      Pupils: Pupils are equal, round, and reactive to light.   Cardiovascular:      Rate and Rhythm: Normal rate and regular rhythm.      Pulses: Normal pulses.      Heart sounds: Normal heart sounds. No murmur heard.  Pulmonary:      Effort: Pulmonary effort is normal. No respiratory distress.      Breath sounds: Normal breath sounds.   Abdominal:      General: Bowel sounds are normal.      Palpations: Abdomen is soft.      Tenderness: There is no abdominal tenderness.   Musculoskeletal:         General: Tenderness and signs of injury present. No swelling. Normal range of motion.      Cervical back: Normal range of motion and neck supple.      Comments: 0.5 cm laceration on medial aspect of the 2nd finger.  Neurovascular is intact.  Tendons  are intact.   Skin:     General: Skin is warm and dry.      Capillary Refill: Capillary refill takes less than 2 seconds.      Findings: Lesion present.      Comments: 0.5 cm laceration on the left 2nd finger medial aspect of the finger.   Neurological:      General: No focal deficit present.      Mental Status: He is alert and oriented to person, place, and time.   Psychiatric:         Mood and Affect: Mood normal.         Behavior: Behavior normal.         Thought Content: Thought content normal.       Patient Data   Labs Ordered/Reviewed - No data to display  XR HAND LEFT   Final Result by Edi, Radresults In (04/30 1639)   NO ACUTE FRACTURE OR DISLOCATION.       If acute hand or wrist trauma is suspected and initial radiographs are negative or equivocal, repeat radiographs in 10-14 days, MRI without IV contrast, or CT without IV contrast is usually appropriate as the next imaging study. (ACR Appropriateness Criteria: Acute Hand and Wrist Trauma, 2018)                Radiologist location ID: [id number]           Medical Decision Making         Medical Decision Making  Patient is 52 year old white male complaining of cutting his left 2nd finger with a Dremel tool several hours ago.  Patient was in a room at [facility] ER and decided to leave.  Patient stated he was in the waiting room.  Patient denies any numbness or tingling in the finger.  Denies any weakness in the finger.  Patient can move his finger in all directions.  Patient will have an x-ray done and be given a tetanus shot.  Patient will have a Steri-Strip on his finger and then a Band-Aid.  Patient will follow up with his family physician next 3-4 days for wound check.    Amount and/or Complexity of Data Reviewed  Radiology: ordered.    Risk  OTC drugs.  Prescription drug management.             Medications Administered in the ED   ibuprofen (MOTRIN) tablet (has no administration in time range)   bacitracin 500 units/gram topical ointment tube (has no administration in time range)   diphtheria, pertussis-acell, tetanus (BOOSTRIX) IM injection (0.5 mL IntraMUSCULAR Given 06/09/22 1627)     Clinical Impression   Laceration of index finger (Primary)       Disposition: Discharged               Clinical Impression   Laceration of index finger (Primary)       Current Discharge Medication List        START taking these medications    Details   Ibuprofen (MOTRIN) 800 mg Oral Tablet Take 1 Tablet (800 mg total) by mouth Four times a day as needed for Pain  Qty: 30 Tablet, Refills: 0

## 2022-06-09 NOTE — ED Nurses Note (Signed)
Patient reports attempting to take his cast off his left ankle, and obtained a small laceration to left 2nd finger. Moderate amount of dried blood noted. Denies any numbness/tingling. Able to move extremity, reports pain with movement.

## 2022-06-10 NOTE — ED Nurses Note (Signed)
[place] [facility], [facility] Emergency Department  Peer Recovery Coach Assessment    Initial Evaluation  Referred by:: Nurse  Location of Evaluation: Emergency Department Via Phone  How many times in the last 12 months have you been to the ED?: 6 or more                Substance Use History  Patient current substance use status: Patient states he uses dilaudid IV daily and drinks 1-2 pints of liquors daily.    Prior treatment history?: No    Currently enrolled in substance use program?: No    Within the last 30 days, what substances has the patient used?: Opiates, Alcohol  Age of first opiate use: 52 years old  When was the patient's last use of opiates/heroin?: 0-24 hours  Patient's age at first substance use?: 52 or older  Drug route of administration: Injected, Oral    Has the patient ever had sustained abstinence?: No    Does patient want opiate use treatment?: No         Family, Social, Home & Safety History  Marital Status: Single    Number of children: 0       Need to improve relationships with family?: Yes    Social network: Substance using peers    Current living situation: Independent  Any help needed with the following?: None  Contact phone number for the patient: [phone]  Emergency contact name and phone number: None Listed    Has the patient had any legal issues within the past 30 days?: None         Employment  Current employment status: Unemployed    Diplomatic Services operational officer?: No  Needs assistance with job search?: No    Engagement  Readiness ruler: 1  Summary of assessment priority areas: Substance abuse treatment, Safety    Brief Intervention  Discussed plan to reduce/quit substance use?: Yes  Discussed willingness to enter treatment?: Yes  Indicated patient's stage of change:: 1 - Precontemplation    Patient seen by Peer Recovery Coach and is a candidate for buprenorphine administration in the ED. Patient needs assessment for bup treatment.: Yes    Plan  Was the  patient referred to treatment?: No    Was patient referred to physician for Buprenorphine Assessment in the ED?: Yes  How did patient receive Buprenorphine?:  (Patient not willing to enter treatment)    Did patient receive Narcan in the ED?: No         Follow-up  Patient admitted for treatment?: No        Need for additional follow-up?: Yes  Additional comments: Patient uses IV dilaudid, and 1-2 pints of liquor daily. Patient denies wanting any type of treatment. Safe use and practices discussed with patient.    Elzie Rings, Peer Recovery Coach 06/10/2022 10:13

## 2022-10-15 ENCOUNTER — Emergency Department (HOSPITAL_BASED_OUTPATIENT_CLINIC_OR_DEPARTMENT_OTHER): Payer: MEDICAID

## 2022-10-15 ENCOUNTER — Other Ambulatory Visit: Payer: Self-pay

## 2022-10-15 ENCOUNTER — Emergency Department
Admission: EM | Admit: 2022-10-15 | Discharge: 2022-10-15 | Discharge status: Against Medical Advice | Payer: MEDICAID | Attending: Emergency Medicine | Admitting: Emergency Medicine

## 2022-10-15 ENCOUNTER — Encounter (HOSPITAL_BASED_OUTPATIENT_CLINIC_OR_DEPARTMENT_OTHER): Payer: Self-pay

## 2022-10-15 DIAGNOSIS — Z7289 Other problems related to lifestyle: Secondary | ICD-10-CM | POA: Insufficient documentation

## 2022-10-15 DIAGNOSIS — R000 Tachycardia, unspecified: Secondary | ICD-10-CM | POA: Insufficient documentation

## 2022-10-15 DIAGNOSIS — R9431 Abnormal electrocardiogram [ECG] [EKG]: Secondary | ICD-10-CM | POA: Insufficient documentation

## 2022-10-15 DIAGNOSIS — F99 Mental disorder, not otherwise specified: Secondary | ICD-10-CM | POA: Insufficient documentation

## 2022-10-15 DIAGNOSIS — Z5329 Procedure and treatment not carried out because of patient's decision for other reasons: Secondary | ICD-10-CM | POA: Insufficient documentation

## 2022-10-15 DIAGNOSIS — R079 Chest pain, unspecified: Secondary | ICD-10-CM | POA: Insufficient documentation

## 2022-10-15 DIAGNOSIS — R4689 Other symptoms and signs involving appearance and behavior: Secondary | ICD-10-CM

## 2022-10-15 DIAGNOSIS — R0789 Other chest pain: Secondary | ICD-10-CM

## 2022-10-15 LAB — ECG 12 LEAD
Atrial Rate: 112 {beats}/min
Calculated P Axis: 57 degrees
Calculated R Axis: 21 degrees
Calculated T Axis: 44 degrees
PR Interval: 154 ms
QRS Duration: 82 ms
QT Interval: 336 ms
QTC Calculation: 458 ms
Ventricular rate: 112 {beats}/min

## 2022-10-15 MED ORDER — SODIUM CHLORIDE 0.9 % IV BOLUS
1000.0000 mL | INJECTION | Status: DC
Start: 2022-10-15 — End: 2022-10-15

## 2022-10-15 MED ORDER — ASPIRIN 81 MG CHEWABLE TABLET
324.0000 mg | CHEWABLE_TABLET | ORAL | Status: DC
Start: 2022-10-15 — End: 2022-10-15

## 2022-10-15 NOTE — ED Nurses Note (Signed)
Pt refused to receive any treatment. Stated "if I'm not having a heart attack or stroke then I'm leaving." Pt signed AMA consent.

## 2022-10-15 NOTE — ED Triage Notes (Signed)
Starting this am numbness and tingling, chest pain and shortness of breath,

## 2022-10-15 NOTE — ED Nurses Note (Signed)
Pt states he is leaving °

## 2022-10-15 NOTE — ED Nurses Note (Signed)
Pt c/o chest pain radiating to neck with SOB since this AM. 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 Pt verbally belligerent in room with Dr Ermalinda Memos. During assessment pt yelled "get that Dr out of here, I don't want him in here." Dr Ermalinda Memos attempted to redirect pt by proceeding with assessment. Pt yelling "this dr is a piece of shit, accusing me of drugs. I'm not here for drugs. I'm having chest pain." Pt continued escalating the situation. Pt was informed if his actions continued that PD would be called. Pt stated "oh get the police up here, I haven't done anything." ER staff and security in room, attempting to de-escalate the pt with no relief. Pt yelled "get this thing out (IV) so I can leave."

## 2022-10-15 NOTE — ED Provider Notes (Signed)
[place] [facility], [facility] Emergency Department  ED Primary Provider Note  History of Present Illness   Chief Complaint   Patient presents with    Chest Pain      Steven Parrish is a 52 y.o. male who had concerns including Chest Pain .  Arrival: The patient arrived by Car complaining of having midsternal chest pain radiating to his neck and down his left arm.  He complaining of shortness a breath with the pain.  Patient refuses to tell us what he was doing at the time.  Patient then escalated and became extremely violent screaming and yelling at staff.  He was screaming at staff accusing him of doing drugs when no one even asked about drugs.  Patient has been in the ER previously with drugs as well as alcohol intoxication.  Patient states he took 6 aspirin and 5 nitroglycerin sublingual without any relief.  Patient then became escalated again when he saw security standing at the bedside.  Then started directing his anger at security.  He then stated that he is leaving.  He was told that he might be having a heart attack any stated he really did not care he is leaving.  {Use the HPI, ROS, Phys Exam, & MDM tabs at the top of the note composer to access the Notewriter SmartBlocks for the respective sections as needed. As of Feb 09, 2021 you are only required to have a "medically appropriate" History, ROS, and PE for billing purposes. Do not modify this italicized text, it will disappear upon signing your note:123}  HPI  Review of Systems   Review of Systems   Constitutional:  Positive for activity change and appetite change. Negative for chills and fever.   HENT:  Negative for ear pain and sore throat.    Eyes:  Negative for pain and visual disturbance.   Respiratory:  Positive for shortness of breath. Negative for cough.    Cardiovascular:  Positive for chest pain. Negative for palpitations.   Gastrointestinal:  Negative for abdominal pain and vomiting.   Genitourinary:  Negative for  dysuria and hematuria.   Musculoskeletal:  Negative for arthralgias and back pain.   Skin:  Negative for color change and rash.   Neurological:  Negative for seizures and syncope.   All other systems reviewed and are negative.     Historical Data   History Reviewed This Encounter:     Physical Exam   ED Triage Vitals [10/15/22 0951]   BP (Non-Invasive) (!) 145/117   Heart Rate (!) 128   Respiratory Rate (!) 24   Temperature 36.1 C (97 F)   SpO2 99 %   Weight 90.7 kg (200 lb)   Height 1.803 m (5\' 11" )     Physical Exam  Vitals and nursing note reviewed.   Constitutional:       General: He is not in acute distress.     Appearance: Normal appearance. He is well-developed and normal weight.   HENT:      Head: Normocephalic and atraumatic.      Right Ear: External ear normal.      Left Ear: External ear normal.      Nose: Nose normal.      Mouth/Throat:      Mouth: Mucous membranes are dry.   Eyes:      Extraocular Movements: Extraocular movements intact.      Conjunctiva/sclera: Conjunctivae normal.      Pupils: Pupils are equal, round, and  reactive to light.   Cardiovascular:      Rate and Rhythm: Normal rate and regular rhythm.      Pulses: Normal pulses.      Heart sounds: Normal heart sounds. No murmur heard.  Pulmonary:      Effort: Pulmonary effort is normal. No respiratory distress.      Breath sounds: Normal breath sounds.   Abdominal:      General: Bowel sounds are normal.      Palpations: Abdomen is soft.      Tenderness: There is no abdominal tenderness.   Musculoskeletal:         General: No swelling. Normal range of motion.      Cervical back: Normal range of motion and neck supple.   Skin:     General: Skin is warm and dry.      Capillary Refill: Capillary refill takes less than 2 seconds.   Neurological:      General: No focal deficit present.      Mental Status: He is alert and oriented to person, place, and time.   Psychiatric:         Mood and Affect: Mood normal.      Comments: Patient is aggravated  an upset screaming and yelling at everybody in the room.       Patient Data   {Click here to open the ED Workup Activity for clinical data review *This link will automatically disappear upon signing your note*:123}Labs Ordered/Reviewed - No data to display  No orders to display     Medical Decision Making   ED clinical impression missing, please click on the following link to add Clinical Impressions ***  then refresh the note prior to signing.  {Be sure to fill out the MDM SmartBlock in Notewriter to the left. Do not modify this italicized text, it will disappear upon signing your note:123}  Medical Decision Making  Patient is 52 year old white male complaining of midsternal chest pain radiating to the neck and down left arm.  Patient became very aggravated and irritated with staff and would not answer any questions.  She started screaming and yelling at all of [place] accusing him of doing drugs when nobody even mentioned drugs.  He then got in an argument with security and then decided that he signing out AMA against our advice.  Patient was told that he may die of heart attack and he told us they really did not care and told us go F oursalves.  Patient then walked out.    Amount and/or Complexity of Data Reviewed  Labs: ordered.  Radiology: ordered.  ECG/medicine tests: ordered.     Details: Sinus tachycardia 112, PR interval 154 MS, QT interval 336 MS, flat T-waves 3, AVF, V6.  Poor R-wave progression through the precordium.    Risk  OTC drugs.             Medications Administered in the ED   aspirin chewable tablet 324 mg (has no administration in time range)   NS bolus infusion 1,000 mL (has no administration in time range)     ED clinical impression missing, please click on the following link to add Clinical Impressions ***  then refresh the note prior to signing.    Disposition: Data Unavailable       {Remember to refresh your note prior to signing. Use Control + F11 or click the refresh button at the bottom of  the note. This reminder text will a  utomatically  disappear when you sign your note.:123}        ED clinical impression missing, please click on the following link to add Clinical Impressions ***  then refresh the note prior to signing.    Current Discharge Medication List

## 2022-10-15 NOTE — ED Nurses Note (Signed)
This nurse witnessed pt's signature of AMA consent. This nurse d/c IV. Pt constantly yelling at security "I haven't done anything wrong, but if you want to meet me outside then let's go. I'll be waiting outside." Pt screaming and yelling as he walked off the unit.

## 2022-10-16 NOTE — ED Notes (Signed)
[place] [facility], [facility] Emergency Department  Peer Recovery Coach Assessment    Initial Evaluation  Referred by:: Nurse  Location of Evaluation: Emergency Department  How many times in the last 12 months have you been to the ED?: 6 or more  Have you ever served or are you currently serving in the Armed Forces?: No             Substance Use History  Patient current substance use status: Patient stated he uses IV dilaudid daily and drinks around 2 pints of liquor daily.    Prior treatment history?: No    Currently enrolled in substance use program?: No    Within the last 30 days, what substances has the patient used?: Opiates, Alcohol  Age of first opiate use: 52 years old  When was the patient's last use of opiates/heroin?: 1-3 days  Patient's age at first substance use?: 70 or older  Drug route of administration: Oral, Injected    Has the patient ever had sustained abstinence?: No    Does patient want opiate use treatment?: No         Family, Social, Home & Safety History  Marital Status: Single    Number of children: 0       Need to improve relationships with family?: Yes    Social network: Substance using peers    Current living situation: Independent  Any help needed with the following?: None  Contact phone number for the patient: [phone]       Has the patient had any legal issues within the past 30 days?: None         Employment  Current employment status: Unemployed    Diplomatic Services operational officer?: No  Needs assistance with job search?: No    Engagement  Readiness ruler: 1  Summary of assessment priority areas: Substance abuse treatment    Brief Intervention  Discussed plan to reduce/quit substance use?: Yes  Discussed willingness to enter treatment?: Yes  Indicated patient's stage of change:: 1 - Precontemplation    Patient seen by Peer Recovery Coach and is a candidate for buprenorphine administration in the ED. Patient needs assessment for bup treatment.: Yes    Plan  Was  the patient referred to treatment?: No    Was patient referred to physician for Buprenorphine Assessment in the ED?: No    Did patient receive Narcan in the ED?: No         Follow-up  Patient admitted for treatment?: No        Need for additional follow-up?: Yes  Additional comments: Harm reduction and long-term affects were discussed.    Haynes Bast, Peer Recovery Coach 10/16/2022 10:55

## 2022-10-19 ENCOUNTER — Telehealth (HOSPITAL_BASED_OUTPATIENT_CLINIC_OR_DEPARTMENT_OTHER): Payer: Self-pay

## 2022-10-19 NOTE — ED Notes (Signed)
Follow up 1. No answer, left voicemail for return call.

## 2022-10-20 ENCOUNTER — Telehealth (HOSPITAL_BASED_OUTPATIENT_CLINIC_OR_DEPARTMENT_OTHER): Payer: Self-pay

## 2022-10-20 NOTE — ED Notes (Signed)
Follow up 2. No answer, left voicemail for patient to return my call.

## 2022-10-21 ENCOUNTER — Telehealth (HOSPITAL_BASED_OUTPATIENT_CLINIC_OR_DEPARTMENT_OTHER): Payer: Self-pay

## 2022-10-21 NOTE — ED Notes (Signed)
Follow up 3. No answer, left message for call back. No further follow up required.

## 2022-10-22 ENCOUNTER — Encounter (HOSPITAL_BASED_OUTPATIENT_CLINIC_OR_DEPARTMENT_OTHER): Payer: Self-pay

## 2022-10-22 ENCOUNTER — Emergency Department: Admission: EM | Admit: 2022-10-22 | Discharge: 2022-10-22 | Discharge status: Against Medical Advice | Payer: MEDICAID

## 2022-10-22 ENCOUNTER — Other Ambulatory Visit: Payer: Self-pay

## 2022-10-22 DIAGNOSIS — R9431 Abnormal electrocardiogram [ECG] [EKG]: Secondary | ICD-10-CM | POA: Insufficient documentation

## 2022-10-22 DIAGNOSIS — Z5321 Procedure and treatment not carried out due to patient leaving prior to being seen by health care provider: Secondary | ICD-10-CM | POA: Insufficient documentation

## 2022-10-22 DIAGNOSIS — R000 Tachycardia, unspecified: Secondary | ICD-10-CM | POA: Insufficient documentation

## 2022-10-22 DIAGNOSIS — Z560 Unemployment, unspecified: Secondary | ICD-10-CM | POA: Insufficient documentation

## 2022-10-22 HISTORY — DX: Acute myocardial infarction, unspecified (CMS HCC): I21.9

## 2022-10-22 NOTE — ED Triage Notes (Addendum)
Patient reports having chest tightness with shortness of breath. Chest tightness does not radiate. Patient states he is also having bloody snot come out of his nose.

## 2022-10-22 NOTE — ED Nurses Note (Signed)
Patient up and out of bed walking down hallway, asked patient where he is going and he states "Im leaving", explained to patient provider is in an emergency but patient continued to leave. Charge nurse aware.

## 2022-10-23 DIAGNOSIS — I252 Old myocardial infarction: Secondary | ICD-10-CM

## 2022-10-23 DIAGNOSIS — R9431 Abnormal electrocardiogram [ECG] [EKG]: Secondary | ICD-10-CM

## 2022-10-23 DIAGNOSIS — R000 Tachycardia, unspecified: Secondary | ICD-10-CM

## 2022-10-23 DIAGNOSIS — R079 Chest pain, unspecified: Secondary | ICD-10-CM

## 2022-10-23 LAB — ECG 12 LEAD
Atrial Rate: 114 {beats}/min
Calculated P Axis: 44 degrees
Calculated R Axis: 102 degrees
Calculated T Axis: -14 degrees
PR Interval: 154 ms
QRS Duration: 88 ms
QT Interval: 336 ms
QTC Calculation: 463 ms
Ventricular rate: 114 {beats}/min

## 2022-10-23 NOTE — ED Notes (Signed)
[place] [facility], [facility] Emergency Department  Peer Recovery Coach Assessment    Initial Evaluation  Referred by:: Nurse  Location of Evaluation: Emergency Department  How many times in the last 12 months have you been to the ED?: 6 or more  Have you ever served or are you currently serving in the Armed Forces?: No             Substance Use History  Patient current substance use status: Patient stated he uses IV dilaudid daily and drinks around 2 pints of liquor daily.    Prior treatment history?: No    Currently enrolled in substance use program?: No    Within the last 30 days, what substances has the patient used?: Opiates, Alcohol  Age of first opiate use: 52 years old  When was the patient's last use of opiates/heroin?: 0-24 hours  Patient's age at first substance use?: 27 or older (52 y/o)  Drug route of administration: Oral, Injected    Has the patient ever had sustained abstinence?: No    Does patient want opiate use treatment?: No         Family, Social, Home & Safety History  Marital Status: Single            Need to improve relationships with family?: Yes    Social network: Substance using peers    Current living situation: Independent  Any help needed with the following?: None  Contact phone number for the patient: [phone]       Has the patient had any legal issues within the past 30 days?: None         Employment  Current employment status: Unemployed    Diplomatic Services operational officer?: No  Needs assistance with job search?: No    Engagement  Readiness ruler: 1  Summary of assessment priority areas: Substance abuse treatment    Brief Intervention  Discussed plan to reduce/quit substance use?: Yes  Discussed willingness to enter treatment?: Yes  Indicated patient's stage of change:: 1 - Precontemplation    Patient seen by Peer Recovery Coach and is a candidate for buprenorphine administration in the ED. Patient needs assessment for bup treatment.: Yes    Plan  Was the patient  referred to treatment?: No    Was patient referred to physician for Buprenorphine Assessment in the ED?: No (Not currently experiencing withdrawl)    Did patient receive Narcan in the ED?: No         Follow-up  Patient admitted for treatment?: No        Need for additional follow-up?: Yes  Additional comments: Harm reduction and long-term affects were discussed.    Haynes Bast, Peer Recovery Coach 10/23/2022 08:39

## 2022-10-24 ENCOUNTER — Emergency Department
Admission: EM | Admit: 2022-10-24 | Discharge: 2022-10-24 | Disposition: A | Discharge status: Home / Self Care | Payer: Non-veteran care | Attending: Emergency Medicine | Admitting: Emergency Medicine

## 2022-10-24 ENCOUNTER — Encounter (HOSPITAL_BASED_OUTPATIENT_CLINIC_OR_DEPARTMENT_OTHER): Payer: Self-pay

## 2022-10-24 ENCOUNTER — Other Ambulatory Visit: Payer: Self-pay

## 2022-10-24 DIAGNOSIS — F1020 Alcohol dependence, uncomplicated: Secondary | ICD-10-CM

## 2022-10-24 DIAGNOSIS — Z1152 Encounter for screening for COVID-19: Secondary | ICD-10-CM | POA: Insufficient documentation

## 2022-10-24 DIAGNOSIS — E860 Dehydration: Secondary | ICD-10-CM | POA: Insufficient documentation

## 2022-10-24 DIAGNOSIS — F10939 Alcohol use, unspecified with withdrawal, unspecified (CMS HCC): Secondary | ICD-10-CM

## 2022-10-24 DIAGNOSIS — R030 Elevated blood-pressure reading, without diagnosis of hypertension: Secondary | ICD-10-CM | POA: Insufficient documentation

## 2022-10-24 DIAGNOSIS — F1110 Opioid abuse, uncomplicated: Secondary | ICD-10-CM | POA: Insufficient documentation

## 2022-10-24 DIAGNOSIS — Y900 Blood alcohol level of less than 20 mg/100 ml: Secondary | ICD-10-CM

## 2022-10-24 DIAGNOSIS — F10239 Alcohol dependence with withdrawal, unspecified: Secondary | ICD-10-CM | POA: Insufficient documentation

## 2022-10-24 DIAGNOSIS — E878 Other disorders of electrolyte and fluid balance, not elsewhere classified: Secondary | ICD-10-CM | POA: Insufficient documentation

## 2022-10-24 LAB — COVID-19, FLU A/B, RSV RAPID BY PCR
INFLUENZA VIRUS TYPE A: NOT DETECTED
INFLUENZA VIRUS TYPE B: NOT DETECTED
RESPIRATORY SYNCTIAL VIRUS (RSV): NOT DETECTED
SARS-CoV-2: NOT DETECTED

## 2022-10-24 LAB — CBC WITH DIFF
BASOPHIL #: 0.04 10*3/uL (ref 0.00–0.30)
BASOPHIL %: 1 % (ref 0–3)
EOSINOPHIL #: 0.26 10*3/uL (ref 0.00–0.80)
EOSINOPHIL %: 4 % (ref 1–8)
HCT: 41.8 % — ABNORMAL LOW (ref 42.0–51.0)
HGB: 14.1 g/dL (ref 13.5–18.0)
LYMPHOCYTE #: 3.14 10*3/uL (ref 1.10–5.00)
LYMPHOCYTE %: 42 % (ref 25–45)
MCH: 30.9 pg (ref 27.0–32.0)
MCHC: 33.7 g/dL (ref 32.0–36.0)
MCV: 91.7 fL (ref 78.0–99.0)
MONOCYTE #: 0.81 10*3/uL (ref 0.00–1.30)
MONOCYTE %: 11 % (ref 0–12)
MPV: 7.2 fL — ABNORMAL LOW (ref 7.4–10.4)
NEUTROPHIL #: 3.26 10*3/uL (ref 1.80–8.40)
NEUTROPHIL %: 43 % (ref 40–76)
PLATELETS: 253 10*3/uL (ref 140–440)
RBC: 4.56 10*6/uL (ref 4.20–6.00)
RDW: 17.4 % — ABNORMAL HIGH (ref 11.6–14.8)
WBC: 7.5 10*3/uL (ref 4.0–10.5)

## 2022-10-24 LAB — COMPREHENSIVE METABOLIC PANEL, NON-FASTING
ALBUMIN/GLOBULIN RATIO: 0.8 (ref 0.8–1.4)
ALBUMIN: 3.6 g/dL (ref 3.4–5.0)
ALKALINE PHOSPHATASE: 268 U/L — ABNORMAL HIGH (ref 46–116)
ALT (SGPT): 121 U/L — ABNORMAL HIGH (ref <=78)
ANION GAP: 12 mmol/L (ref 4–13)
AST (SGOT): 133 U/L — ABNORMAL HIGH (ref 15–37)
BILIRUBIN TOTAL: 0.6 mg/dL (ref 0.2–1.0)
BUN/CREA RATIO: 11
BUN: 12 mg/dL (ref 7–18)
CALCIUM, CORRECTED: 9.1 mg/dL
CALCIUM: 8.8 mg/dL (ref 8.5–10.1)
CHLORIDE: 98 mmol/L (ref 98–107)
CO2 TOTAL: 26 mmol/L (ref 21–32)
CREATININE: 1.13 mg/dL (ref 0.70–1.30)
ESTIMATED GFR: 79 mL/min/{1.73_m2} (ref >59)
GLOBULIN: 4.4
GLUCOSE: 102 mg/dL (ref 74–106)
OSMOLALITY, CALCULATED: 272 mOsm/kg (ref 270–290)
POTASSIUM: 3.1 mmol/L — ABNORMAL LOW (ref 3.5–5.1)
PROTEIN TOTAL: 8 g/dL (ref 6.4–8.2)
SODIUM: 136 mmol/L (ref 136–145)

## 2022-10-24 LAB — ETHANOL, SERUM/PLASMA: ETHANOL: 11 mg/dL — ABNORMAL HIGH (ref <=3)

## 2022-10-24 LAB — DRUG SCREEN, NO CONFIRMATION, URINE
AMPHETAMINES URINE: NEGATIVE
BARBITURATES URINE: NEGATIVE
BENZODIAZEPINES URINE: NEGATIVE
CANNABINOIDS URINE: POSITIVE — AB
COCAINE METABOLITES URINE: NEGATIVE
METHADONE URINE: NEGATIVE
OPIATES URINE: NEGATIVE
PCP URINE: NEGATIVE

## 2022-10-24 LAB — SALICYLATE ACID LEVEL: SALICYLATE LEVEL: 4 mg/dL (ref 3–20)

## 2022-10-24 LAB — MAGNESIUM: MAGNESIUM: 1.6 mg/dL — ABNORMAL LOW (ref 1.8–2.4)

## 2022-10-24 LAB — ACETAMINOPHEN LEVEL: ACETAMINOPHEN LEVEL: 0 ug/mL — ABNORMAL LOW (ref 10.0–30.0)

## 2022-10-24 MED ORDER — CHLORDIAZEPOXIDE 25 MG CAPSULE
ORAL_CAPSULE | ORAL | Status: AC
Start: 2022-10-24 — End: 2022-10-24
  Filled 2022-10-24: qty 1

## 2022-10-24 MED ORDER — MAGNESIUM SULFATE 1 GRAM/100 ML IN DEXTROSE 5 % INTRAVENOUS PIGGYBACK
INJECTION | INTRAVENOUS | Status: AC
Start: 2022-10-24 — End: 2022-10-24
  Filled 2022-10-24: qty 200

## 2022-10-24 MED ORDER — ONDANSETRON HCL (PF) 4 MG/2 ML INJECTION SOLUTION
INTRAMUSCULAR | Status: AC
Start: 2022-10-24 — End: 2022-10-24
  Filled 2022-10-24: qty 2

## 2022-10-24 MED ORDER — FOLIC ACID 1 MG TABLET
1.0000 mg | ORAL_TABLET | Freq: Every day | ORAL | Status: DC
Start: 2022-10-24 — End: 2022-10-24
  Administered 2022-10-24: 1 mg via ORAL

## 2022-10-24 MED ORDER — MAGNESIUM HYDROXIDE 400 MG/5 ML ORAL SUSPENSION
30.0000 mL | Freq: Every evening | ORAL | Status: DC | PRN
Start: 2022-10-24 — End: 2022-10-24

## 2022-10-24 MED ORDER — FOLIC ACID 1 MG TABLET
ORAL_TABLET | ORAL | Status: AC
Start: 2022-10-24 — End: 2022-10-24
  Filled 2022-10-24: qty 1

## 2022-10-24 MED ORDER — LORAZEPAM 2 MG/ML INJECTION SYRINGE
INJECTION | INTRAMUSCULAR | Status: AC
Start: 2022-10-24 — End: 2022-10-24
  Filled 2022-10-24: qty 1

## 2022-10-24 MED ORDER — TRAZODONE 50 MG TABLET
50.0000 mg | ORAL_TABLET | Freq: Every evening | ORAL | Status: DC | PRN
Start: 2022-10-24 — End: 2022-10-24

## 2022-10-24 MED ORDER — POTASSIUM CHLORIDE ER 20 MEQ TABLET,EXTENDED RELEASE(PART/CRYST)
ORAL_TABLET | ORAL | Status: AC
Start: 2022-10-24 — End: 2022-10-24
  Filled 2022-10-24: qty 2

## 2022-10-24 MED ORDER — THIAMINE MONONITRATE (VITAMIN B1) 100 MG TABLET
100.0000 mg | ORAL_TABLET | Freq: Every day | ORAL | Status: DC
Start: 2022-10-24 — End: 2022-10-24
  Administered 2022-10-24: 0 mg via ORAL

## 2022-10-24 MED ORDER — ACETAMINOPHEN 325 MG TABLET
650.0000 mg | ORAL_TABLET | ORAL | Status: DC | PRN
Start: 2022-10-24 — End: 2022-10-24

## 2022-10-24 MED ORDER — MAGNESIUM SULFATE 1 GRAM/100 ML IN DEXTROSE 5 % INTRAVENOUS PIGGYBACK
1.0000 g | INJECTION | Freq: Once | INTRAVENOUS | Status: AC
Start: 2022-10-24 — End: 2022-10-24
  Administered 2022-10-24: 0 g via INTRAVENOUS
  Administered 2022-10-24: 1 g via INTRAVENOUS

## 2022-10-24 MED ORDER — PYRIDOXINE (VITAMIN B6) 50 MG TABLET
100.0000 mg | ORAL_TABLET | Freq: Every day | ORAL | Status: DC
Start: 2022-10-24 — End: 2022-10-24
  Administered 2022-10-24: 100 mg via ORAL

## 2022-10-24 MED ORDER — CHLORDIAZEPOXIDE 25 MG CAPSULE
25.0000 mg | ORAL_CAPSULE | Freq: Three times a day (TID) | ORAL | Status: DC
Start: 2022-10-24 — End: 2022-10-24
  Administered 2022-10-24 (×2): 25 mg via ORAL

## 2022-10-24 MED ORDER — ALUMINUM-MAG HYDROXIDE-SIMETHICONE 200 MG-200 MG-20 MG/5 ML ORAL SUSP
30.0000 mL | ORAL | Status: DC | PRN
Start: 2022-10-24 — End: 2022-10-24

## 2022-10-24 MED ORDER — THIAMINE HCL (VITAMIN B1) 100 MG/ML INJECTION SOLUTION
100.0000 mg | INTRAVENOUS | Status: DC
Start: 2022-10-24 — End: 2022-10-24
  Administered 2022-10-24: 100 mg via INTRAVENOUS
  Administered 2022-10-24: 0 mg via INTRAVENOUS
  Filled 2022-10-24: qty 2

## 2022-10-24 MED ORDER — MAGNESIUM SULFATE 1 GRAM/100 ML IN DEXTROSE 5 % INTRAVENOUS PIGGYBACK
1.0000 g | INJECTION | Freq: Once | INTRAVENOUS | Status: AC
Start: 2022-10-24 — End: 2022-10-24
  Administered 2022-10-24: 1 g via INTRAVENOUS
  Administered 2022-10-24: 0 g via INTRAVENOUS

## 2022-10-24 MED ORDER — MAGNESIUM SULFATE 1 GRAM/100 ML IN DEXTROSE 5 % INTRAVENOUS PIGGYBACK
INJECTION | INTRAVENOUS | Status: AC
Start: 2022-10-24 — End: 2022-10-24
  Filled 2022-10-24: qty 100

## 2022-10-24 MED ORDER — THIAMINE HCL (VITAMIN B1) 100 MG/ML INJECTION SOLUTION
INTRAMUSCULAR | Status: AC
Start: 2022-10-24 — End: 2022-10-24
  Filled 2022-10-24: qty 6

## 2022-10-24 MED ORDER — POTASSIUM CHLORIDE ER 20 MEQ TABLET,EXTENDED RELEASE(PART/CRYST)
40.0000 meq | ORAL_TABLET | ORAL | Status: AC
Start: 2022-10-24 — End: 2022-10-24
  Administered 2022-10-24: 40 meq via ORAL

## 2022-10-24 MED ORDER — PYRIDOXINE (VITAMIN B6) 50 MG TABLET
ORAL_TABLET | ORAL | Status: AC
Start: 2022-10-24 — End: 2022-10-24
  Filled 2022-10-24: qty 2

## 2022-10-24 NOTE — ED Nurses Note (Signed)
Pt states he's withdrawing from alcohol. He states he normally drinks 1/5 vodka daily and his last drink was around 2100 last night. Pt having visible tremors and diaphoretic, clammy. Pt c/o nausea. Pt medicated per [facility] orders. Will monitor.

## 2022-10-24 NOTE — ED Triage Notes (Signed)
EMS reports X 1 day of general weakness. " Feels like his muscles are locking up." Reports pt is a daily drinker  , X 1 fifth of vodka daily. Has not had a drink of alcohol in 24 hours. BS 115

## 2022-10-24 NOTE — ED Nurses Note (Signed)
Decided he did not want to stay to get help with alcohol withdrawal provider informed

## 2022-10-24 NOTE — ED Provider Notes (Signed)
[place] [facility], [facility] Emergency Department  ED Primary Provider Note  HPI:  Steven Parrish is a 52 y.o. male     Patient states he is coming off alcohol.  Last drink was less than 12 hours ago.  Complains of shakes dizziness sweating nausea.  He was unsure whether he was alcohol withdrawal seizures.  He was just states he wants to get off alcohol.  He was not clear whether he wants actually go to rehab.  He was not suicidal he was not homicidal.  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  He did use fentanyl this week.  Full code  ROS review and negative aside from stated in HPI.    Physical Exam:  ED Triage Vitals [10/24/22 0229]   BP (Non-Invasive) (!) 160/108   Heart Rate 85   Respiratory Rate 20   Temperature 37.2 C (98.9 F)   SpO2 97 %   Weight 90.7 kg (200 lb)   Height 1.803 m (5\' 11" )     No acute distress.  Tremulous diaphoretic.  Patient awake alert oriented x3.   Pupils 3 mm equal round reactive.  Extraocular movements are intact.  Oropharynx is clear.  Mucous membranes moist.  Trachea midline.  Neck is supple.  Heart has regular rate and rhythm without significant murmurs rubs or gallops.  Lungs are clear to auscultation.  Abdomen soft nontender, nondistended.  Moving all extremities without difficulty.  No rash no edema.      Patient data:  Labs Ordered/Reviewed   ETHANOL, SERUM/PLASMA - Abnormal; Notable for the following components:       Result Value    ETHANOL 11 (*)     All other components within normal limits   ACETAMINOPHEN LEVEL - Abnormal; Notable for the following components:    ACETAMINOPHEN LEVEL 0.0 (*)     All other components within normal limits   COMPREHENSIVE METABOLIC PANEL, NON-FASTING - Abnormal; Notable for the following components:    POTASSIUM 3.1 (*)     ALKALINE PHOSPHATASE 268 (*)     ALT (SGPT) 121 (*)     AST (SGOT) 133 (*)     All other components within normal limits    Narrative:     Estimated Glomerular Filtration Rate  (eGFR) is calculated using the CKD-EPI (2021) equation, intended for patients 16 years of age and older. If gender is not documented or "unknown", there will be no eGFR calculation.   DRUG SCREEN, NO CONFIRMATION, URINE - Abnormal; Notable for the following components:    CANNABINOIDS URINE Positive (*)     All other components within normal limits   MAGNESIUM - Abnormal; Notable for the following components:    MAGNESIUM 1.6 (*)     All other components within normal limits   CBC WITH DIFF - Abnormal; Notable for the following components:    HCT 41.8 (*)     RDW 17.4 (*)     MPV 7.2 (*)     All other components within normal limits   SALICYLATE ACID LEVEL - Normal   COVID-19, FLU A/B, RSV RAPID BY PCR - Normal    Narrative:     Results are for the simultaneous qualitative identification of SARS-CoV-2 (formerly 2019-nCoV), Influenza A, Influenza B, and RSV RNA. These etiologic agents are generally detectable in nasopharyngeal and nasal swabs during the ACUTE PHASE of infection. Hence, this test is intended to be performed on respiratory specimens collected from individuals with signs and symptoms  of upper respiratory tract infection who meet Centers for Disease Control and Prevention (CDC) clinical and/or epidemiological criteria for Coronavirus Disease 2019 (COVID-19) testing. CDC COVID-19 criteria for testing on human specimens is available at [facility] webpage information for Healthcare Professionals: Coronavirus Disease 2019 (COVID-19) ([employer]).     False-negative results may occur if the virus has genomic mutations, insertions, deletions, or rearrangements or if performed very early in the course of illness. Otherwise, negative results indicate virus specific RNA targets are not detected, however negative results do not preclude SARS-CoV-2 infection/COVID-19, Influenza, or Respiratory syncytial virus infection. Results should not be used as the sole basis for  patient management decisions. Negative results must be combined with clinical observations, patient history, and epidemiological information. If upper respiratory tract infection is still suspected based on exposure history together with other clinical findings, re-testing should be considered.    Test methodology:   Cepheid Xpert Xpress SARS-CoV-2/Flu/RSV Assay real-time polymerase chain reaction (RT-PCR) test on the GeneXpert Dx and Xpert Xpress systems.   CBC/DIFF    Narrative:     The following orders were created for panel order CBC/DIFF.  Procedure                               Abnormality         Status                     ---------                               -----------         ------                     CBC WITH TKZS[[id number]]                Abnormal            Final result                 Please view results for these tests on the individual orders.     No orders to display       MDM:    Alcohol withdrawal.  We will monitor for impending delirium tremens.  We will correct for dehydration electrolyte imbalance.  Blood pressure 160/108.  Patient placed on CIWA protocol given Ativan.  Terms of workup patient reports cannabinoid positive magnesium 1.6.  Potassium 3.1..  Patient did receive IV fluids thiamine folic acid.  Resting comfortably on re-evaluation.  Medically cleared.  I did speak with the patient again.  He once again denies SI HI but it was seeking inpatient help for substance abuse.      [place]  Clinical Impression   Alcohol withdrawal (CMS HCC) (Primary)   Alcohol dependence (CMS HCC)   Opioid abuse (CMS HCC)     Medications Administered in the ED   acetaminophen (TYLENOL) tablet (has no administration in time range)   aluminum-magnesium hydroxide-simethicone (MAG-AL PLUS) 200-200-20 mg per 5 mL oral liquid (has no administration in time range)   magnesium hydroxide (MILK OF MAGNESIA) 400mg  per 5mL oral liquid (has no administration in time range)   traZODone (DESYREL) tablet (has no  administration in time range)   chlordiazePOXIDE (LIBRIUM) capsule (25 mg Oral Given 10/24/22 0536)   folic acid (FOLVITE) tablet (1 mg Oral Given 10/24/22 0533)   thiamine-vitamin B1  tablet (0 mg Oral Not Given 10/24/22 0330)   pyridOXINE Vitamin B6 tablet (100 mg Oral Given 10/24/22 0533)   thiamine (VITAMIN B1) 100 mg in NS 50 mL IVPB (0 mg Intravenous Stopped 10/24/22 0314)   potassium chloride (K-DUR) extended release tablet (has no administration in time range)   magnesium sulfate 1 G in D5W 100 mL premix IVPB (0 g Intravenous Stopped 10/24/22 0607)   magnesium sulfate 1 G in D5W 100 mL premix IVPB (0 g Intravenous Stopped 10/24/22 0500)        Current Discharge Medication List        CONTINUE these medications - NO CHANGES were made during your visit.        Details   amLODIPine 5 mg Tablet  Commonly known as: NORVASC   5 mg, Oral, DAILY  Qty: 30 Tablet  Refills: 0     Ibuprofen 800 mg Tablet  Commonly known as: MOTRIN   800 mg, Oral, 4 TIMES DAILY PRN  Qty: 30 Tablet  Refills: 0     naloxone 4 mg/actuation Spray, Non-Aerosol  Commonly known as: NARCAN   1 Spray, INTRANASAL, EVERY 2 MIN PRN, for actual or suspected opioid overdose. Call 911 if used.  Qty: 2 Each  Refills: 2     omeprazole 20 mg Capsule, Delayed Release(E.C.)  Commonly known as: PRILOSEC   20 mg, DAILY  Refills: 0

## 2022-10-24 NOTE — ED Attending Handoff Note (Signed)
[place], [facility] Emergency Department  Emergency Department  Course Note      Steven Parrish is a 52 y.o. male who had concerns including Alcohol Problem.     I accepted this patient in transfer of care from Dr. Katrinka Blazing at 0730 am    I personally evaluated and examined the patient at the time of the visit.     Lab review: All labs reviewed by me at time of visit.   Results for orders placed or performed during the hospital encounter of 10/24/22 (from the past 24 hour(s))   ETHANOL, SERUM/PLASMA   Result Value Ref Range    ETHANOL 11 (H) <=3 mg/dL   ACETAMINOPHEN LEVEL   Result Value Ref Range    ACETAMINOPHEN LEVEL 0.0 (L) 10.0 - 30.0 ug/mL   SALICYLATE ACID LEVEL   Result Value Ref Range    SALICYLATE LEVEL 4 3 - 20 mg/dL   COMPREHENSIVE METABOLIC PANEL, NON-FASTING   Result Value Ref Range    SODIUM 136 136 - 145 mmol/L    POTASSIUM 3.1 (L) 3.5 - 5.1 mmol/L    CHLORIDE 98 98 - 107 mmol/L    CO2 TOTAL 26 21 - 32 mmol/L    ANION GAP 12 4 - 13 mmol/L    BUN 12 7 - 18 mg/dL    CREATININE 1.61 0.96 - 1.30 mg/dL    BUN/CREA RATIO 11     ESTIMATED GFR 79 >59 mL/min/1.6m^2    ALBUMIN 3.6 3.4 - 5.0 g/dL    CALCIUM 8.8 8.5 - 04.5 mg/dL    GLUCOSE 409 74 - 811 mg/dL    ALKALINE PHOSPHATASE 268 (H) 46 - 116 U/L    ALT (SGPT) 121 (H) <=78 U/L    AST (SGOT) 133 (H) 15 - 37 U/L    BILIRUBIN TOTAL 0.6 0.2 - 1.0 mg/dL    PROTEIN TOTAL 8.0 6.4 - 8.2 g/dL    ALBUMIN/GLOBULIN RATIO 0.8 0.8 - 1.4    OSMOLALITY, CALCULATED 272 270 - 290 mOsm/kg    CALCIUM, CORRECTED 9.1 mg/dL    GLOBULIN 4.4     Narrative    Estimated Glomerular Filtration Rate (eGFR) is calculated using the CKD-EPI (2021) equation, intended for patients 65 years of age and older. If gender is not documented or "unknown", there will be no eGFR calculation.   CBC/DIFF    Narrative    The following orders were created for panel order CBC/DIFF.  Procedure                               Abnormality         Status                      ---------                               -----------         ------                     CBC WITH BJYN[[id number]]                Abnormal            Final result                 Please view results for  these tests on the individual orders.   COVID-19, FLU A/B, RSV RAPID BY PCR   Result Value Ref Range    SARS-CoV-2 Not Detected Not Detected    INFLUENZA VIRUS TYPE A Not Detected Not Detected    INFLUENZA VIRUS TYPE B Not Detected Not Detected    RESPIRATORY SYNCTIAL VIRUS (RSV) Not Detected Not Detected    Narrative    Results are for the simultaneous qualitative identification of SARS-CoV-2 (formerly 2019-nCoV), Influenza A, Influenza B, and RSV RNA. These etiologic agents are generally detectable in nasopharyngeal and nasal swabs during the ACUTE PHASE of infection. Hence, this test is intended to be performed on respiratory specimens collected from individuals with signs and symptoms of upper respiratory tract infection who meet Centers for Disease Control and Prevention (CDC) clinical and/or epidemiological criteria for Coronavirus Disease 2019 (COVID-19) testing. CDC COVID-19 criteria for testing on human specimens is available at [facility] webpage information for Healthcare Professionals: Coronavirus Disease 2019 (COVID-19) ([employer]).     False-negative results may occur if the virus has genomic mutations, insertions, deletions, or rearrangements or if performed very early in the course of illness. Otherwise, negative results indicate virus specific RNA targets are not detected, however negative results do not preclude SARS-CoV-2 infection/COVID-19, Influenza, or Respiratory syncytial virus infection. Results should not be used as the sole basis for patient management decisions. Negative results must be combined with clinical observations, patient history, and epidemiological information. If upper respiratory tract infection is still suspected based on exposure  history together with other clinical findings, re-testing should be considered.    Test methodology:   Cepheid Xpert Xpress SARS-CoV-2/Flu/RSV Assay real-time polymerase chain reaction (RT-PCR) test on the GeneXpert Dx and Xpert Xpress systems.   DRUG SCREEN, NO CONFIRMATION, URINE   Result Value Ref Range    AMPHETAMINES URINE Negative Negative    BARBITURATES URINE Negative Negative    BENZODIAZEPINES URINE Negative Negative    METHADONE URINE Negative Negative    COCAINE METABOLITES URINE Negative Negative    OPIATES URINE Negative Negative    PCP URINE Negative Negative    CANNABINOIDS URINE Positive (A) Negative   MAGNESIUM   Result Value Ref Range    MAGNESIUM 1.6 (L) 1.8 - 2.4 mg/dL   CBC WITH DIFF   Result Value Ref Range    WBC 7.5 4.0 - 10.5 x10^3/uL    RBC 4.56 4.20 - 6.00 x10^6/uL    HGB 14.1 13.5 - 18.0 g/dL    HCT 17.6 (L) 16.0 - 51.0 %    MCV 91.7 78.0 - 99.0 fL    MCH 30.9 27.0 - 32.0 pg    MCHC 33.7 32.0 - 36.0 g/dL    RDW 73.7 (H) 10.6 - 14.8 %    PLATELETS 253 140 - 440 x10^3/uL    MPV 7.2 (L) 7.4 - 10.4 fL    NEUTROPHIL % 43 40 - 76 %    LYMPHOCYTE % 42 25 - 45 %    MONOCYTE % 11 0 - 12 %    EOSINOPHIL % 4 1 - 8 %    BASOPHIL % 1 0 - 3 %    NEUTROPHIL # 3.26 1.80 - 8.40 x10^3/uL    LYMPHOCYTE # 3.14 1.10 - 5.00 x10^3/uL    MONOCYTE # 0.81 0.00 - 1.30 x10^3/uL    EOSINOPHIL # 0.26 0.00 - 0.80 x10^3/uL    BASOPHIL # 0.04 0.00 - 0.30 x10^3/uL        Radiology Review: All radiologic  testing viewed by me at the time of visit.   No orders to display            Course:   Medical Decision Making  The patient states that he does not want to go to they [facility] crisis unit.  He states he does not need "that kind help. "He states he wants to go to a facility in the [facility] system.  I offered to help him with this directly.  However he states he wants to go home and he will call them on Monday.  I advised against this but he states that he is going home.  He is  currently alert and oriented to person, place, time and situation.  I feel he can make his own decisions.  He recognizes me from prior visits in [place] and he is fully alert and oriented.    Amount and/or Complexity of Data Reviewed  Labs: ordered.    Risk  Diagnosis or treatment significantly limited by social determinants of health.         Clinical Impression   Alcohol withdrawal (CMS HCC) (Primary)   Alcohol dependence (CMS HCC)   Opioid abuse (CMS HCC)       Following the history, physical exam, and ED workup, the patient was deemed stable and suitable for discharge. The patient/caregiver was advised to return to the ED for any new or worsening symptoms. Discharge medications, and follow-up instructions were discussed with the patient/caregiver in detail, who verbalizes understanding. The patient/caregiver is in agreement and is comfortable with the plan of care.    Disposition: Discharged         Current Discharge Medication List        CONTINUE these medications - NO CHANGES were made during your visit.        Details   amLODIPine 5 mg Tablet  Commonly known as: NORVASC   5 mg, Oral, DAILY  Qty: 30 Tablet  Refills: 0     Ibuprofen 800 mg Tablet  Commonly known as: MOTRIN   800 mg, Oral, 4 TIMES DAILY PRN  Qty: 30 Tablet  Refills: 0     naloxone 4 mg/actuation Spray, Non-Aerosol  Commonly known as: NARCAN   1 Spray, INTRANASAL, EVERY 2 MIN PRN, for actual or suspected opioid overdose. Call 911 if used.  Qty: 2 Each  Refills: 2     omeprazole 20 mg Capsule, Delayed Release(E.C.)  Commonly known as: PRILOSEC   20 mg, DAILY  Refills: 0            Follow up:   Phillips Climes, DO  [address]  [place] [address]  [phone]    In 3 days              Alphonzo Cruise, DO

## 2022-10-24 NOTE — Discharge Instructions (Signed)
Call the [facility] system today.  They have someone on-call 24 hours a day 7 days a week.  I definitely feel that you need to go into a long-term detox center.  Return to the ER for any emergencies   Do not quit alcohol abruptly as this can cause withdrawal and even death.

## 2022-10-24 NOTE — ED Notes (Signed)
[facility] called for possible placement. They have beds but their fax is down and can not receive emails. Staff stated she would let the nurse coming in know that we have a referral.

## 2022-10-28 NOTE — ED Notes (Signed)
[facility], [facility] Emergency Department  Peer Recovery Coach Assessment    Initial Evaluation  Referred by:: Nurse  Location of Evaluation: Emergency Department  How many times in the last 12 months have you been to the ED?: 3  Have you ever served or are you currently serving in the Armed Forces?: No             Substance Use History  Patient current substance use status: Patient stated he uses IV dilaudid and drinks 2 pints of liquor daily, Patient stated he has been doing  so for years.    Prior treatment history?: No    Currently enrolled in substance use program?: No    Within the last 30 days, what substances has the patient used?: Opiates, Alcohol  Age of first opiate use: 52 years old  When was the patient's last use of opiates/heroin?: 0-24 hours  Patient's age at first substance use?: 21-25  Drug route of administration: Injected, Oral    Has the patient ever had sustained abstinence?: No    Does patient want opiate use treatment?: No         Family, Social, Home & Safety History  Marital Status: Single            Need to improve relationships with family?: Yes    Social network: Substance using peers    Current living situation: Independent  Any help needed with the following?: None  Contact phone number for the patient: [phone]       Has the patient had any legal issues within the past 30 days?: None         Employment  Current employment status: Unemployed    Diplomatic Services operational officer?: No  Needs assistance with job search?: No    Engagement  Readiness ruler: 1  Summary of assessment priority areas: Substance abuse treatment    Brief Intervention  Discussed plan to reduce/quit substance use?: Yes  Discussed willingness to enter treatment?: Yes  Indicated patient's stage of change:: 1 - Precontemplation    Patient seen by Peer Recovery Coach and is a candidate for buprenorphine administration in the ED. Patient needs assessment for bup treatment.: Yes    Plan  Was  the patient referred to treatment?: No    Was patient referred to physician for Buprenorphine Assessment in the ED?: No (Not currently experiencing with drawl)    Did patient receive Narcan in the ED?: No         Follow-up                   Haynes Bast, Peer Recovery Coach 10/28/2022 09:04

## 2022-10-30 ENCOUNTER — Encounter (HOSPITAL_COMMUNITY): Payer: Self-pay

## 2022-10-30 ENCOUNTER — Emergency Department (HOSPITAL_COMMUNITY): Payer: Non-veteran care

## 2022-10-30 ENCOUNTER — Other Ambulatory Visit: Payer: Self-pay

## 2022-10-30 ENCOUNTER — Inpatient Hospital Stay
Admission: EM | Admit: 2022-10-30 | Discharge: 2022-10-31 | DRG: 894 | Discharge status: Against Medical Advice | Payer: MEDICAID | Attending: HOSPITALIST-INTERNAL MEDICINE | Admitting: HOSPITALIST-INTERNAL MEDICINE

## 2022-10-30 DIAGNOSIS — Z79899 Other long term (current) drug therapy: Secondary | ICD-10-CM

## 2022-10-30 DIAGNOSIS — I8510 Secondary esophageal varices without bleeding: Secondary | ICD-10-CM | POA: Diagnosis present

## 2022-10-30 DIAGNOSIS — F19239 Other psychoactive substance dependence with withdrawal, unspecified: Secondary | ICD-10-CM | POA: Diagnosis present

## 2022-10-30 DIAGNOSIS — I10 Essential (primary) hypertension: Secondary | ICD-10-CM | POA: Diagnosis present

## 2022-10-30 DIAGNOSIS — I252 Old myocardial infarction: Secondary | ICD-10-CM

## 2022-10-30 DIAGNOSIS — K709 Alcoholic liver disease, unspecified: Secondary | ICD-10-CM | POA: Diagnosis present

## 2022-10-30 DIAGNOSIS — F10129 Alcohol abuse with intoxication, unspecified: Principal | ICD-10-CM | POA: Diagnosis present

## 2022-10-30 DIAGNOSIS — K0889 Other specified disorders of teeth and supporting structures: Secondary | ICD-10-CM | POA: Diagnosis present

## 2022-10-30 DIAGNOSIS — R079 Chest pain, unspecified: Secondary | ICD-10-CM | POA: Diagnosis present

## 2022-10-30 DIAGNOSIS — I864 Gastric varices: Secondary | ICD-10-CM | POA: Diagnosis present

## 2022-10-30 DIAGNOSIS — F10939 Alcohol use, unspecified with withdrawal, unspecified (CMS HCC): Principal | ICD-10-CM | POA: Diagnosis present

## 2022-10-30 DIAGNOSIS — K7460 Unspecified cirrhosis of liver: Secondary | ICD-10-CM | POA: Diagnosis present

## 2022-10-30 DIAGNOSIS — R0789 Other chest pain: Secondary | ICD-10-CM

## 2022-10-30 DIAGNOSIS — F1110 Opioid abuse, uncomplicated: Secondary | ICD-10-CM | POA: Diagnosis present

## 2022-10-30 DIAGNOSIS — F17210 Nicotine dependence, cigarettes, uncomplicated: Secondary | ICD-10-CM | POA: Diagnosis present

## 2022-10-30 DIAGNOSIS — F10139 Alcohol abuse with withdrawal, unspecified: Secondary | ICD-10-CM | POA: Diagnosis present

## 2022-10-30 DIAGNOSIS — I249 Acute ischemic heart disease, unspecified: Secondary | ICD-10-CM | POA: Diagnosis present

## 2022-10-30 DIAGNOSIS — F1990 Other psychoactive substance use, unspecified, uncomplicated: Secondary | ICD-10-CM

## 2022-10-30 DIAGNOSIS — Z8711 Personal history of peptic ulcer disease: Secondary | ICD-10-CM

## 2022-10-30 DIAGNOSIS — Y902 Blood alcohol level of 40-59 mg/100 ml: Secondary | ICD-10-CM | POA: Diagnosis present

## 2022-10-30 DIAGNOSIS — I2510 Atherosclerotic heart disease of native coronary artery without angina pectoris: Secondary | ICD-10-CM | POA: Diagnosis present

## 2022-10-30 DIAGNOSIS — I8500 Esophageal varices without bleeding: Secondary | ICD-10-CM | POA: Insufficient documentation

## 2022-10-30 LAB — COMPREHENSIVE METABOLIC PANEL, NON-FASTING
ALBUMIN/GLOBULIN RATIO: 1.1 (ref 0.8–1.4)
ALBUMIN: 4.5 g/dL (ref 3.5–5.7)
ALKALINE PHOSPHATASE: 154 U/L — ABNORMAL HIGH (ref 34–104)
ALT (SGPT): 47 U/L (ref 7–52)
ANION GAP: 12 mmol/L (ref 4–13)
AST (SGOT): 38 U/L (ref 13–39)
BILIRUBIN TOTAL: 0.4 mg/dL (ref 0.3–1.0)
BUN/CREA RATIO: 21 (ref 6–22)
BUN: 15 mg/dL (ref 7–25)
CALCIUM, CORRECTED: 9.2 mg/dL (ref 8.9–10.8)
CALCIUM: 9.6 mg/dL (ref 8.6–10.3)
CHLORIDE: 103 mmol/L (ref 98–107)
CO2 TOTAL: 24 mmol/L (ref 21–31)
CREATININE: 0.73 mg/dL (ref 0.60–1.30)
ESTIMATED GFR: 110 mL/min/{1.73_m2} (ref >59)
GLOBULIN: 4 (ref 2.9–5.4)
GLUCOSE: 103 mg/dL (ref 74–109)
OSMOLALITY, CALCULATED: 279 mOsm/kg (ref 270–290)
POTASSIUM: 3.8 mmol/L (ref 3.5–5.1)
PROTEIN TOTAL: 8.5 g/dL (ref 6.4–8.9)
SODIUM: 139 mmol/L (ref 136–145)

## 2022-10-30 LAB — URINALYSIS, MACROSCOPIC
BILIRUBIN: NEGATIVE mg/dL
BLOOD: NEGATIVE mg/dL
GLUCOSE: NEGATIVE mg/dL
KETONES: NEGATIVE mg/dL
LEUKOCYTES: NEGATIVE WBCs/uL
NITRITE: NEGATIVE
PH: 5 (ref 5.0–9.0)
PROTEIN: 10 mg/dL
SPECIFIC GRAVITY: 1.026 (ref 1.002–1.030)
UROBILINOGEN: NORMAL mg/dL

## 2022-10-30 LAB — CBC WITH DIFF
BASOPHIL #: 0 10*3/uL (ref 0.00–0.10)
BASOPHIL %: 1 % (ref 0–1)
EOSINOPHIL #: 0.3 10*3/uL (ref 0.00–0.50)
EOSINOPHIL %: 4 % (ref 1–8)
HCT: 41.8 % (ref 36.7–47.1)
HGB: 14.2 g/dL (ref 12.5–16.3)
LYMPHOCYTE #: 3.5 10*3/uL — ABNORMAL HIGH (ref 1.00–3.00)
LYMPHOCYTE %: 43 % (ref 16–44)
MCH: 30.7 pg (ref 23.8–33.4)
MCHC: 33.9 g/dL (ref 32.5–36.3)
MCV: 90.5 fL (ref 73.0–96.2)
MONOCYTE #: 0.7 10*3/uL (ref 0.30–1.00)
MONOCYTE %: 8 % (ref 5–13)
MPV: 7.7 fL (ref 7.4–11.4)
NEUTROPHIL #: 3.7 10*3/uL (ref 1.85–7.80)
NEUTROPHIL %: 45 % (ref 43–77)
PLATELETS: 230 10*3/uL (ref 140–440)
RBC: 4.62 10*6/uL (ref 4.06–5.63)
RDW: 15.7 % (ref 12.1–16.2)
WBC: 8.3 10*3/uL (ref 3.6–10.2)

## 2022-10-30 LAB — PT/INR
INR: 0.97 (ref 0.84–1.10)
PROTHROMBIN TIME: 11.3 seconds (ref 9.8–12.7)

## 2022-10-30 LAB — ETHANOL, SERUM/PLASMA: ETHANOL: 56 mg/dL — ABNORMAL HIGH

## 2022-10-30 LAB — DRUG SCREEN, NO CONFIRMATION, URINE
AMPHETAMINES URINE: NEGATIVE
BARBITURATES URINE: NEGATIVE
BENZODIAZEPINES URINE: POSITIVE — AB
BUPRENORPHINE URINE: POSITIVE — AB
CANNABINOIDS URINE: POSITIVE — AB
COCAINE METABOLITES URINE: NEGATIVE
FENTANYL, URINE: POSITIVE — AB
METHADONE URINE: NEGATIVE
OPIATES URINE: POSITIVE — AB
OXYCODONE URINE: NEGATIVE
PCP URINE: NEGATIVE

## 2022-10-30 LAB — AMMONIA: AMMONIA: 37 umol/L (ref 16–53)

## 2022-10-30 LAB — THYROID STIMULATING HORMONE WITH FREE T4 REFLEX: TSH: 5.492 u[IU]/mL — ABNORMAL HIGH (ref 0.450–5.330)

## 2022-10-30 LAB — TROPONIN-I
TROPONIN I: 3 ng/L (ref <20)
TROPONIN I: 3 ng/L (ref <20)
TROPONIN I: 2 ng/L (ref <20)

## 2022-10-30 LAB — URINALYSIS, MICROSCOPIC
RBCS: 1 /hpf (ref <4)
WBCS: 1 /hpf (ref <6)

## 2022-10-30 LAB — THYROXINE, FREE (FREE T4): THYROXINE (T4), FREE: 0.78 ng/dL (ref 0.58–1.64)

## 2022-10-30 LAB — SALICYLATE ACID LEVEL: SALICYLATE LEVEL: <3 mg/dL (ref <=30)

## 2022-10-30 LAB — LIPASE: LIPASE: 47 U/L (ref 11–82)

## 2022-10-30 LAB — ACETAMINOPHEN LEVEL: ACETAMINOPHEN LEVEL: <10 ug/mL — ABNORMAL LOW (ref 10–30)

## 2022-10-30 LAB — MAGNESIUM: MAGNESIUM: 1.9 mg/dL (ref 1.9–2.7)

## 2022-10-30 MED ORDER — PHENOBARBITAL 32.4 MG TABLET
97.2000 mg | ORAL_TABLET | Freq: Three times a day (TID) | ORAL | Status: DC
Start: 2022-10-30 — End: 2022-11-01
  Administered 2022-10-30: 0 mg via ORAL
  Administered 2022-10-31 (×2): 97.2 mg via ORAL
  Filled 2022-10-30 (×2): qty 3

## 2022-10-30 MED ORDER — PANTOPRAZOLE 40 MG INTRAVENOUS SOLUTION
40.0000 mg | INTRAVENOUS | Status: AC
Start: 2022-10-30 — End: 2022-10-30
  Administered 2022-10-30: 40 mg via INTRAVENOUS

## 2022-10-30 MED ORDER — PHENOBARBITAL SODIUM 65 MG/ML INJECTION SOLUTION
INTRAMUSCULAR | Status: AC
Start: 2022-10-30 — End: 2022-10-30
  Filled 2022-10-30: qty 2

## 2022-10-30 MED ORDER — LOPERAMIDE 2 MG CAPSULE
2.0000 mg | ORAL_CAPSULE | ORAL | Status: DC | PRN
Start: 2022-10-30 — End: 2022-10-31

## 2022-10-30 MED ORDER — PANTOPRAZOLE 40 MG INTRAVENOUS SOLUTION
INTRAVENOUS | Status: AC
Start: 2022-10-30 — End: 2022-10-30
  Filled 2022-10-30: qty 10

## 2022-10-30 MED ORDER — NICOTINE 21 MG/24 HR DAILY TRANSDERMAL PATCH
21.0000 mg | MEDICATED_PATCH | Freq: Every day | TRANSDERMAL | Status: DC
Start: 2022-10-31 — End: 2022-10-31
  Administered 2022-10-30: 21 mg via TRANSDERMAL
  Filled 2022-10-30: qty 1

## 2022-10-30 MED ORDER — LORAZEPAM 2 MG/ML INJECTION SYRINGE
INJECTION | INTRAMUSCULAR | Status: AC
Start: 2022-10-30 — End: 2022-10-30
  Filled 2022-10-30: qty 1

## 2022-10-30 MED ORDER — PHENOBARBITAL 32.4 MG TABLET
ORAL_TABLET | ORAL | Status: AC
Start: 2022-10-30 — End: 2022-10-30
  Filled 2022-10-30: qty 3

## 2022-10-30 MED ORDER — FAMOTIDINE (PF) 20 MG/2 ML INTRAVENOUS SOLUTION
20.0000 mg | INTRAVENOUS | Status: AC
Start: 2022-10-30 — End: 2022-10-30
  Administered 2022-10-30: 20 mg via INTRAVENOUS

## 2022-10-30 MED ORDER — CEPHALEXIN 500 MG CAPSULE
500.0000 mg | ORAL_CAPSULE | Freq: Four times a day (QID) | ORAL | Status: DC
Start: 2022-10-30 — End: 2022-10-31
  Administered 2022-10-30: 500 mg via ORAL
  Administered 2022-10-31: 0 mg via ORAL
  Administered 2022-10-31 (×2): 500 mg via ORAL
  Filled 2022-10-30 (×2): qty 1

## 2022-10-30 MED ORDER — LORAZEPAM 2 MG/ML INJECTION WRAPPER
1.0000 mg | INTRAMUSCULAR | Status: DC
Start: 2022-10-30 — End: 2022-10-31
  Administered 2022-10-30: 1 mg via INTRAVENOUS

## 2022-10-30 MED ORDER — PANTOPRAZOLE 40 MG TABLET,DELAYED RELEASE
DELAYED_RELEASE_TABLET | ORAL | Status: AC
Start: 2022-10-30 — End: 2022-10-30
  Filled 2022-10-30: qty 1

## 2022-10-30 MED ORDER — ALUMINUM-MAG HYDROXIDE-SIMETHICONE 200 MG-200 MG-20 MG/5 ML ORAL SUSP
30.0000 mL | ORAL | Status: DC | PRN
Start: 2022-10-30 — End: 2022-10-31

## 2022-10-30 MED ORDER — THIAMINE MONONITRATE (VITAMIN B1) 100 MG TABLET
200.0000 mg | ORAL_TABLET | Freq: Once | ORAL | Status: AC
Start: 2022-10-30 — End: 2022-10-30
  Administered 2022-10-30: 200 mg via ORAL

## 2022-10-30 MED ORDER — CLONIDINE HCL 0.1 MG TABLET
0.1000 mg | ORAL_TABLET | ORAL | Status: DC | PRN
Start: 2022-10-30 — End: 2022-10-31
  Filled 2022-10-30: qty 1

## 2022-10-30 MED ORDER — PHENOBARBITAL 32.4 MG TABLET
64.8000 mg | ORAL_TABLET | Freq: Three times a day (TID) | ORAL | Status: DC
Start: 2022-11-01 — End: 2022-11-03

## 2022-10-30 MED ORDER — THIAMINE HCL (VITAMIN B1) 100 MG/ML INJECTION SOLUTION
INTRAMUSCULAR | Status: AC
Start: 2022-10-30 — End: 2022-10-30
  Filled 2022-10-30: qty 4

## 2022-10-30 MED ORDER — SODIUM CHLORIDE 0.9 % INTRAVENOUS SOLUTION
INTRAVENOUS | Status: DC
Start: 2022-10-30 — End: 2022-10-31
  Administered 2022-10-31: 0 mL via INTRAVENOUS

## 2022-10-30 MED ORDER — PYRIDOXINE (VITAMIN B6) 50 MG TABLET
100.0000 mg | ORAL_TABLET | Freq: Every day | ORAL | Status: DC
Start: 2022-10-30 — End: 2022-10-31
  Administered 2022-10-30: 100 mg via ORAL

## 2022-10-30 MED ORDER — PHENOBARBITAL 32.4 MG TABLET
32.4000 mg | ORAL_TABLET | Freq: Three times a day (TID) | ORAL | Status: DC
Start: 2022-11-03 — End: 2022-10-31

## 2022-10-30 MED ORDER — BISMUTH SUBSALICYLATE 262 MG/15 ML ORAL SUSPENSION
30.0000 mL | ORAL | Status: DC | PRN
Start: 2022-10-30 — End: 2022-11-01
  Filled 2022-10-30: qty 30

## 2022-10-30 MED ORDER — TRAZODONE 50 MG TABLET
50.0000 mg | ORAL_TABLET | Freq: Every evening | ORAL | Status: DC | PRN
Start: 2022-10-30 — End: 2022-10-31

## 2022-10-30 MED ORDER — MULTIVITAMIN WITH FOLIC ACID 400 MCG TABLET
1.0000 | ORAL_TABLET | Freq: Every day | ORAL | Status: DC
Start: 2022-10-31 — End: 2022-10-31
  Administered 2022-10-31: 1 via ORAL
  Filled 2022-10-30: qty 1

## 2022-10-30 MED ORDER — THIAMINE MONONITRATE (VITAMIN B1) 100 MG TABLET
ORAL_TABLET | ORAL | Status: AC
Start: 2022-10-30 — End: 2022-10-30
  Filled 2022-10-30: qty 2

## 2022-10-30 MED ORDER — PHENOBARBITAL SODIUM 65 MG/ML INJECTION SOLUTION
130.0000 mg | INTRAMUSCULAR | Status: AC
Start: 2022-10-30 — End: 2022-10-30
  Administered 2022-10-30: 130 mg via INTRAVENOUS

## 2022-10-30 MED ORDER — PYRIDOXINE (VITAMIN B6) 50 MG TABLET
ORAL_TABLET | ORAL | Status: AC
Start: 2022-10-30 — End: 2022-10-30
  Filled 2022-10-30: qty 2

## 2022-10-30 MED ORDER — THIAMINE MONONITRATE (VITAMIN B1) 100 MG TABLET
100.0000 mg | ORAL_TABLET | Freq: Every day | ORAL | Status: DC
Start: 2022-10-31 — End: 2022-10-31
  Administered 2022-10-31: 100 mg via ORAL
  Filled 2022-10-30: qty 1

## 2022-10-30 MED ORDER — THIAMINE HCL (VITAMIN B1) 100 MG/ML INJECTION SOLUTION
300.0000 mg | Freq: Every day | INTRAMUSCULAR | Status: DC
Start: 2022-10-30 — End: 2022-10-30
  Administered 2022-10-30: 300 mg via INTRAVENOUS

## 2022-10-30 MED ORDER — SODIUM CHLORIDE 0.9 % IV BOLUS
2000.0000 mL | INJECTION | Status: AC
Start: 2022-10-30 — End: 2022-10-30
  Administered 2022-10-30: 0 mL via INTRAVENOUS
  Administered 2022-10-30: 2000 mL via INTRAVENOUS

## 2022-10-30 MED ORDER — LORAZEPAM 2 MG/ML INJECTION WRAPPER
2.0000 mg | INTRAMUSCULAR | Status: AC
Start: 2022-10-30 — End: 2022-10-30
  Administered 2022-10-30: 2 mg via INTRAVENOUS

## 2022-10-30 MED ORDER — CHLORDIAZEPOXIDE 25 MG CAPSULE
ORAL_CAPSULE | ORAL | Status: AC
Start: 2022-10-30 — End: 2022-10-30
  Filled 2022-10-30: qty 1

## 2022-10-30 MED ORDER — FAMOTIDINE (PF) 20 MG/2 ML INTRAVENOUS SOLUTION
INTRAVENOUS | Status: AC
Start: 2022-10-30 — End: 2022-10-30
  Filled 2022-10-30: qty 2

## 2022-10-30 MED ORDER — HYDROXYZINE PAMOATE 50 MG CAPSULE
50.0000 mg | ORAL_CAPSULE | Freq: Four times a day (QID) | ORAL | Status: DC | PRN
Start: 2022-10-30 — End: 2022-10-31

## 2022-10-30 MED ORDER — MAGNESIUM HYDROXIDE 400 MG/5 ML ORAL SUSPENSION
30.0000 mL | Freq: Every evening | ORAL | Status: DC | PRN
Start: 2022-10-30 — End: 2022-10-31

## 2022-10-30 MED ORDER — ACETAMINOPHEN 325 MG TABLET
650.0000 mg | ORAL_TABLET | ORAL | Status: DC | PRN
Start: 2022-10-30 — End: 2022-10-31

## 2022-10-30 MED ORDER — CHLORDIAZEPOXIDE 25 MG CAPSULE
25.0000 mg | ORAL_CAPSULE | Freq: Four times a day (QID) | ORAL | Status: DC
Start: 2022-10-30 — End: 2022-10-31
  Administered 2022-10-30 – 2022-10-31 (×3): 25 mg via ORAL
  Administered 2022-10-31: 0 mg via ORAL
  Filled 2022-10-30 (×2): qty 1

## 2022-10-30 MED ORDER — FOLIC ACID 1 MG TABLET
1.0000 mg | ORAL_TABLET | Freq: Every day | ORAL | Status: DC
Start: 2022-10-30 — End: 2022-10-31
  Administered 2022-10-30: 1 mg via ORAL

## 2022-10-30 MED ORDER — LORAZEPAM 2 MG/ML INJECTION WRAPPER
1.0000 mg | INTRAMUSCULAR | Status: DC
Start: 2022-10-30 — End: 2022-10-30

## 2022-10-30 MED ORDER — PANTOPRAZOLE 40 MG TABLET,DELAYED RELEASE
40.0000 mg | DELAYED_RELEASE_TABLET | Freq: Every day | ORAL | Status: DC
Start: 2022-10-30 — End: 2022-10-31
  Administered 2022-10-30: 0 mg via ORAL

## 2022-10-30 MED ORDER — THIAMINE MONONITRATE (VITAMIN B1) 100 MG TABLET
ORAL_TABLET | ORAL | Status: AC
Start: 2022-10-30 — End: 2022-10-30
  Filled 2022-10-30: qty 3

## 2022-10-30 MED ORDER — FOLIC ACID 1 MG TABLET
ORAL_TABLET | ORAL | Status: AC
Start: 2022-10-30 — End: 2022-10-30
  Filled 2022-10-30: qty 1

## 2022-10-30 MED ORDER — CEPHALEXIN 500 MG CAPSULE
ORAL_CAPSULE | ORAL | Status: AC
Start: 2022-10-30 — End: 2022-10-30
  Filled 2022-10-30: qty 1

## 2022-10-30 NOTE — ED APP Handoff Note (Signed)
[facility]  Emergency Department  Provider in Triage Note    Name: Steven Parrish  Age: 52 y.o.  Gender: male     Subjective:   Steven Parrish is a 52 y.o. male who presents with complaint of Toothache (/) and Medical Problem  .  Patient presents to department with concerns of a dental abscess x 1 month. Was previously on abx. Also reports that he is coming off of alcohol and hallucinating. Patient reports his last drink was this morning. He states he usually drinks around 1/5 of vodka daily. States he has gone through withdrawals and has had a seizure in the past. Patient states that he just injected 16mg  of dilaudid PTA to deal with the pain.     Objective:   Filed Vitals:    10/30/22 1734   BP: (!) 195/127   Pulse: (!) 128   Resp: 18   Temp: 36.6 C (97.8 F)   SpO2: 97%      Focused Physical Exam shows diaphoretic male in mild distress    Assessment:  A medical screening exam was completed.  This patient is a 52 y.o. male with initial findings showing alcohol withdrawal and dental pain.     Plan:  Please see initial orders and work-up below.  This is to be continued with full evaluation in the main Emergency Department.     No current facility-administered medications for this encounter.     Results for orders placed or performed during the hospital encounter of 10/30/22 (from the past 24 hour(s))   CBC/DIFF    Narrative    The following orders were created for panel order CBC/DIFF.  Procedure                               Abnormality         Status                     ---------                               -----------         ------                     CBC WITH WNUU[[id number]]                                                                 Please view results for these tests on the individual orders.   URINALYSIS, MACROSCOPIC AND MICROSCOPIC W/CULTURE REFLEX    Specimen: Urine, Site not specified    Narrative    The following orders were created for panel order  URINALYSIS, MACROSCOPIC AND MICROSCOPIC W/CULTURE REFLEX.  Procedure                               Abnormality         Status                     ---------                               -----------         ------  URINALYSIS, MACROSCOPIC[[id number]]                                                     URINALYSIS, MICROSCOPIC[[id number]]                                                       Please view results for these tests on the individual orders.        Jannett Celestine, FNP-C  10/30/2022, 17:31

## 2022-10-30 NOTE — ED Nurses Note (Signed)
Report called to [facility], nurse taking over care of pt.

## 2022-10-30 NOTE — H&P (Signed)
[facility]    HOSPITALIST H&P    Steven Parrish 52 y.o. male ED04/ED04   Date of Service: 10/30/2022    Date of Admission:  10/30/2022   PCP: Phillips Climes, DO Code Status:Prior       Chief Complaint: etoh withdrawal     HPI:   This 52 year old white male with known history of cirrhosis, esophageal varices, hypertension, CAD with history of MI in the past with no stents or bypasses, and IV drug abuse, presents to the ED with alcohol withdrawal symptoms, as well as pain in his teeth.  He was concerned about abscesses of his teeth for the last month.  He states he has been having significant pain.  He has been drinking at least a L of vodka a day for approximally 5-6 months.  He also reports shooting IV Dilaudid up until this morning.  He states his last drink was also this morning.  He was asking for help with alcohol and drug withdrawal.  He was willing to go to rehab following hospital stay.  The patient was complaining of some nonspecific chest pain upon arrival to the ED, an EKG was performed.  This did show right heart strain pattern with nonspecific STT wave changes, very similar to previous EKGs dating back to June of 2023.  Patient did have negative troponin x3.  He states his chest pain went away with Ativan.  Patient's labs showed a normal CBC.  BMP showed no abnormalities.  His TSH was minimally elevated at 5.49 2, lipase was 47.  Troponin was 3, 3, 2.  Acetaminophen level of 10.  Ethanol level of 56.  Urine was positive for fentanyl, benzodiazepine, buprenorphine, cannabis, and opiates.  Patient was medicated with phenobarbital 130 mg IV, famotidine, Protonix, lorazepam, and IV fluids.  Patient initially was significantly hypertensive upon arrival at 195/127.  Blood pressure is improving, but patient is still agitated.  He was apparently seeing bugs in the room, and as requesting benzodiazepines at time of my examination.  He will be admitted initially to CCU  with cows, CIWA protocol.  He will have phenobarbital taper, Librium p.o., Ativan p.r.n., and clonidine p.r.n.Marland Kitchen  Patient reports no nausea at this time, he will be continued on p.o. Protonix.  He will be given regular diet for now.  Patient with high likelihood to require Precedex drip during course of admission.    ED medications:   Medications Administered in the ED   LORazepam (ATIVAN) 2 mg/mL injection (2 mg Intravenous Given 10/30/22 1807)   NS bolus infusion 2,000 mL (2,000 mL Intravenous New Bag/New Syringe 10/30/22 1938)   pantoprazole (PROTONIX) 4 mg/mL injection (40 mg Intravenous Given 10/30/22 1940)   famotidine (PEPCID) 10 mg/mL injection (20 mg Intravenous Given 10/30/22 1944)   PHENobarbital (LUMINAL) 65 mg/mL injection (130 mg Intravenous Given 10/30/22 2028)         PMHx:    Past Medical History:   Diagnosis Date    Alcoholic liver disease (CMS HCC)     Cirrhosis of liver (CMS HCC)     Esophageal and gastric varices (CMS HCC)  (CMS HCC)     Esophageal hernia     Essential hypertension     Heart attack (CMS HCC)     IV drug abuse (CMS HCC)     Peptic ulcer     PSHx:   Past Surgical History:   Procedure Laterality Date    HX CHOLECYSTECTOMY  NOSE SURGERY      SINUS SURGERY         Allergies:    Allergies   Allergen Reactions    Lisinopril Angioedema    Chocolate [Cocoa]     Sulfa (Sulfonamides)  Other Adverse Reaction (Add comment)     Can't move    Triple Antibiotic [Neomy-Bacit-Polymyx-Pramoxine] Hives/ Urticaria    Social History  Social History     Tobacco Use    Smoking status: Every Day     Current packs/day: 1.00     Types: Cigarettes    Smokeless tobacco: Former     Types: Snuff   Vaping Use    Vaping status: Never Used   Substance Use Topics    Alcohol use: Yes     Comment: 2 pints of liquor    Drug use: Not Currently     Types: Opioid     Comment: dilaudid       Family History  Family Medical History:    None            Home Meds:      Prior to Admission medications    Medication Sig Start  Date End Date Taking? Authorizing Provider   amLODIPine (NORVASC) 5 mg Oral Tablet Take 1 Tablet (5 mg total) by mouth Once a day for 30 days 05/30/22 06/29/22  Sondra Come, DO   Ibuprofen (MOTRIN) 800 mg Oral Tablet Take 1 Tablet (800 mg total) by mouth Four times a day as needed for Pain  Patient not taking: Reported on 10/30/2022 06/09/22   Mamie Levers, MD   naloxone [facility]) 4 mg per spray nasal spray 1 Spray by INTRANASAL route Every 2 minutes as needed for actual or suspected opioid overdose. Call 911 if used.  Patient not taking: Reported on 04/28/2022 03/29/22   Feliberto Gottron, MD   omeprazole (PRILOSEC) 20 mg Oral Capsule, Delayed Release(E.C.) Take 1 Capsule (20 mg total) by mouth Once a day  Patient not taking: Reported on 04/28/2022    Provider, Historical   lisinopriL (PRINIVIL) 30 mg Oral Tablet Take 1 Tablet (30 mg total) by mouth Once a day  Patient not taking: Reported on 03/29/2022 06/27/21 10/15/22  Leota Jacobsen, MD          ROS:   General: No fever or chills. No weight changes, fatigue, weakness.  Shakiness secondary to withdrawal  HEENT: No headaches, dizziness, changes in vision, changes in hearing, or difficulty swallowing.  Complaining of upper tooth pain for the last month.  No evidence of abscess  Skin:  No rashes, erythema or bruises.   Cardiac: No chest pain, palpitations, or arrhythmia.    Respiratory: No shortness of breath, cough, or wheezing.  GI: No nausea or vomiting. No abdominal pain.   Urinary: No dysuria, hematuria, or change in frequency.    Vascular: No edema.     Musculoskeletal: No muscle weakness, pain, or decreased range of motion.   Neurologic: No loss of sensation, numbness or tingling.   Endocrine: No heat or cold intolerance or polydipsia.   Psychiatric: No insomnia, depression but shakiness is present secondary to withdrawal.  Patient also anxious, and hallucinating seeing bugs.      Results for orders placed or performed during the hospital encounter of 10/30/22  (from the past 24 hour(s))   ECG 12 LEAD   Result Value Ref Range    Ventricular rate 127 BPM    Atrial Rate 127 BPM    PR  Interval 164 ms    QRS Duration 64 ms    QT Interval 284 ms    QTC Calculation 412 ms    Calculated P Axis 51 degrees    Calculated R Axis 31 degrees    Calculated T Axis 31 degrees   COMPREHENSIVE METABOLIC PANEL, NON-FASTING   Result Value Ref Range    SODIUM 139 136 - 145 mmol/L    POTASSIUM 3.8 3.5 - 5.1 mmol/L    CHLORIDE 103 98 - 107 mmol/L    CO2 TOTAL 24 21 - 31 mmol/L    ANION GAP 12 4 - 13 mmol/L    BUN 15 7 - 25 mg/dL    CREATININE 0.98 1.19 - 1.30 mg/dL    BUN/CREA RATIO 21 6 - 22    ESTIMATED GFR 110 >59 mL/min/1.61m^2    ALBUMIN 4.5 3.5 - 5.7 g/dL    CALCIUM 9.6 8.6 - 14.7 mg/dL    GLUCOSE 829 74 - 562 mg/dL    ALKALINE PHOSPHATASE 154 (H) 34 - 104 U/L    ALT (SGPT) 47 7 - 52 U/L    AST (SGOT) 38 13 - 39 U/L    BILIRUBIN TOTAL 0.4 0.3 - 1.0 mg/dL    PROTEIN TOTAL 8.5 6.4 - 8.9 g/dL    ALBUMIN/GLOBULIN RATIO 1.1 0.8 - 1.4    OSMOLALITY, CALCULATED 279 270 - 290 mOsm/kg    CALCIUM, CORRECTED 9.2 8.9 - 10.8 mg/dL    GLOBULIN 4.0 2.9 - 5.4   ETHANOL, SERUM   Result Value Ref Range    ETHANOL 56 (H) 0 mg/dL   ACETAMINOPHEN LEVEL   Result Value Ref Range    ACETAMINOPHEN LEVEL <10 (L) 10 - 30 ug/mL   MAGNESIUM   Result Value Ref Range    MAGNESIUM 1.9 1.9 - 2.7 mg/dL   SALICYLATE ACID LEVEL   Result Value Ref Range    SALICYLATE LEVEL <3 <=30 mg/dL   LIPASE   Result Value Ref Range    LIPASE 47 11 - 82 U/L   THYROID STIMULATING HORMONE WITH FREE T4 REFLEX   Result Value Ref Range    TSH 5.492 (H) 0.450 - 5.330 uIU/mL   CBC WITH DIFF   Result Value Ref Range    WBC 8.3 3.6 - 10.2 x10^3/uL    RBC 4.62 4.06 - 5.63 x10^6/uL    HGB 14.2 12.5 - 16.3 g/dL    HCT 13.0 86.5 - 78.4 %    MCV 90.5 73.0 - 96.2 fL    MCH 30.7 23.8 - 33.4 pg    MCHC 33.9 32.5 - 36.3 g/dL    RDW 69.6 29.5 - 28.4 %    PLATELETS 230 140 - 440 x10^3/uL    MPV 7.7 7.4 - 11.4 fL    NEUTROPHIL % 45 43 - 77 %    LYMPHOCYTE %  43 16 - 44 %    MONOCYTE % 8 5 - 13 %    EOSINOPHIL % 4 1 - 8 %    BASOPHIL % 1 0 - 1 %    NEUTROPHIL # 3.70 1.85 - 7.80 x10^3/uL    LYMPHOCYTE # 3.50 (H) 1.00 - 3.00 x10^3/uL    MONOCYTE # 0.70 0.30 - 1.00 x10^3/uL    EOSINOPHIL # 0.30 0.00 - 0.50 x10^3/uL    BASOPHIL # 0.00 0.00 - 0.10 x10^3/uL   TROPONIN NOW   Result Value Ref Range    TROPONIN I 3 <20 ng/L   THYROXINE,  FREE (FREE T4)   Result Value Ref Range    THYROXINE (T4), FREE 0.78 0.58 - 1.64 ng/dL   TROPONIN IN ONE HOUR   Result Value Ref Range    TROPONIN I 3 <20 ng/L   DRUG SCREEN, NO CONFIRMATION, URINE   Result Value Ref Range    AMPHETAMINES URINE Negative Negative    BARBITURATES URINE Negative Negative    BENZODIAZEPINES URINE Positive (A) Negative    BUPRENORPHINE URINE Positive (A) Negative    CANNABINOIDS URINE Positive (A) Negative    COCAINE METABOLITES URINE Negative Negative    FENTANYL, URINE Positive (A) Negative    OPIATES URINE Positive (A) Negative    OXYCODONE URINE Negative Negative    PCP URINE Negative Negative    METHADONE URINE Negative Negative   URINALYSIS, MACROSCOPIC   Result Value Ref Range    COLOR Yellow Colorless, Light Yellow, Yellow    APPEARANCE Clear Clear    SPECIFIC GRAVITY 1.026 1.002 - 1.030    PH 5.0 5.0 - 9.0    LEUKOCYTES Negative Negative, 100  WBCs/uL    NITRITE Negative Negative    PROTEIN 10 Negative, 10 , 20  mg/dL    GLUCOSE Negative Negative, 30  mg/dL    KETONES Negative Negative, Trace mg/dL    BILIRUBIN Negative Negative, 0.5 mg/dL    BLOOD Negative Negative, 0.03 mg/dL    UROBILINOGEN Normal Normal mg/dL   URINALYSIS, MICROSCOPIC   Result Value Ref Range    MUCOUS Rare Rare, Occasional, Few /hpf    RBCS 1 <4 /hpf    WBCS 1 <6 /hpf   TROPONIN IN THREE HOURS   Result Value Ref Range    TROPONIN I 2 <20 ng/L          Physical:  Filed Vitals:    10/30/22 1830 10/30/22 1845 10/30/22 1900 10/30/22 1915   BP: (!) 154/108 (!) 163/112 (!) 151/110 (!) 164/114   Pulse: (!) 119 (!) 112 (!) 113 (!) 111   Resp: 20  18     Temp:       SpO2: 94% 92% 91% 93%      General: Patient is alert and oriented to person, place,, appearing shaky.  He still hallucinating.  Head: Normocephalic, atraumatic  Eyes: Pupils equally round and react to light and accommodate. Extraocular movements intact.  Conjunctiva normal. Sclerae are normal.    Nose: Nasal passages clear. Mucosa moist.    Throat: Moist oral mucosa. No erythema or exudate of the pharynx. Clear oropharynx.    Neck: Supple. No cervical lymphadenopathy or supraclavicular nodes detected. Trachea midline   Heart:  Tachycardic.  No murmur.  Lungs: Clear to auscultation bilaterally with no wheezes or rales. Equal chest excursion.  No conversational dyspnea. No respiratory distress noted.   Abdomen: Soft, nontender, nondistended belly. Bowel sounds are present in all four quadrants. No rigidity.  No guarding.  No ascites.   Extremities: No edema, cyanosis, or clubbing. Grossly moves all extremities.    Skin: Warm and dry without lesions. No ecchymosis noted.    Neurologic: Cranial nerves II through XII are grossly intact.Strength 5/5 in upper extremities and lower extremities bilaterally.    Genitourinary:  No urinary incontinence or Foley catheter   Psychiatric:  Patient very shaky.  He was apparently still seeing bugs in the room.    Diagnostic studies:  No results found.       EKG interpretation:     @PEVF @    Assessment:  Active Hospital Problems   (*Primary Problem)    Diagnosis    *Alcohol withdrawal (CMS HCC)    Opiate abuse, continuous (CMS HCC)     Chronic    Cirrhosis of liver (CMS HCC)    Essential hypertension     Chronic    Esophageal and gastric varices (CMS HCC)  (CMS HCC)       Plan:  Patient will be admitted for the above problems.  1. Alcohol withdrawal: Patient is started on phenobarbital taper, Librium q.i.d., Ativan p.r.n..  He will be given IV fluids, we started on thiamine, folate, B12.  He will have Protonix daily, and we will monitor daily labs.  Alcohol level  currently 56.  The patient has been drinking excessive amounts for at least 4 months, but has told ED provider it has been as much as 5-6 months.    2. Opiate abuse:  Patient apparently she was IV Dilaudid daily.  Last was proximally 2 hours prior to arrival.    3. Cirrhosis of liver:  Chronic.      4. Hypertension:  The patient with chronic hypertension, at this time I do feel elevated blood pressure secondary to withdrawal.  We will continue treat that, but also will restart home medications once available.    5. Esophageal and gastric varices:  Patient does state these are 1+, denies any history of banding.  We will monitor for any sign of bleeding.      Further interventions will be based on patient's clinical course.  I do feel the patient was high likelihood required drip of either Precedex or Ativan during course of admission.  We will continue with medications as above for now.  We will monitor very closely in CCU least for the next 24-48 hours.  Admission plan was discussed at length with the patient.  All questions at this time have been answered.  Patient at least now is willing to go to inpatient rehab following admission to our facility.  The hospitalist has examined patient, and reviewed all material, and agrees with the above medical management at this time.      DVT prophylaxis:  SCDs      Nolen Mu, PA-C    [facility] MEDICINE HOSPITALIST

## 2022-10-30 NOTE — ED Provider Notes (Signed)
[facility]  ED Primary Provider Note  Patient Name: Steven Parrish  Patient Age: 52 y.o.  Date of Birth: 20-May-1970    Chief Complaint: Toothache (/) and Medical Problem        History of Present Illness       Steven Parrish is a 52 y.o. male who had concerns including Toothache and Medical Problem.  This patient is a 52 year old male who presents with a toothache, and alcohol withdrawal.  The patient last drank the day prior, and feels malaise thereafter.  He drinks 1/5 of vodka daily for 6 months.  He also participated in IV drug use by injecting Dilaudid prior to arrival.        Review of Systems     No other overt Review of Systems are noted to be positive except noted in the HPI.      Historical Data   History Reviewed This Encounter:        Physical Exam   ED Triage Vitals [10/30/22 1734]   BP (Non-Invasive) (!) 195/127   Heart Rate (!) 128   Respiratory Rate 18   Temperature 36.6 C (97.8 F)   SpO2 97 %   Weight 90.7 kg (200 lb)   Height 1.803 m (5\' 11" )         Nursing notes reviewed for what could be assessed. Past Medical, Surgical, and Social history reviewed for what has been completed.    Constitutional: NAD. Well-Developed. Well Nourished.  Head: Normocephalic, atraumatic.  Mouth/Throat:  Symmetric facial.  Eyes: EOM grossly intact, conjunctiva normal.  Neck: Supple  Cardiovascular:  Tachycardic Rate and Rhythm, extremities well perfused.  Pulmonary/Chest: No respiratory distress.   Abdominal:  Nondistended, no overt peritoneal findings.  MSK: No Lower Extremity Edema.  Skin: Warm, dry, and intact  Neuro: Appropriate, CN II-XII grossly intact.   Psych: Pleasant      Follow-up evaluation after Ativan, the patient is still tremulous.        Procedures      Patient Data     Labs Ordered/Reviewed   COMPREHENSIVE METABOLIC PANEL, NON-FASTING - Abnormal; Notable for the following components:       Result Value    ALKALINE PHOSPHATASE 154 (*)     All other components  within normal limits    Narrative:     Estimated Glomerular Filtration Rate (eGFR) is calculated using the CKD-EPI (2021) equation, intended for patients 54 years of age and older. If gender is not documented or "unknown", there will be no eGFR calculation.     ETHANOL, SERUM/PLASMA - Abnormal; Notable for the following components:    ETHANOL 56 (*)     All other components within normal limits   THYROID STIMULATING HORMONE WITH FREE T4 REFLEX - Abnormal; Notable for the following components:    TSH 5.492 (*)     All other components within normal limits   DRUG SCREEN, NO CONFIRMATION, URINE - Abnormal; Notable for the following components:    BENZODIAZEPINES URINE Positive (*)     BUPRENORPHINE URINE Positive (*)     CANNABINOIDS URINE Positive (*)     FENTANYL, URINE Positive (*)     OPIATES URINE Positive (*)     All other components within normal limits    Narrative:     Any results reported as "positive" on this urine drug screen are unconfirmed screening results and should be used for medical(i.e.,treatment)purposes only. Unconfirmed screening results must not be used  for non-medical purposes (e.g. employment or legal testing). Upon request, all results reported as "positive" can be sent to a reference laboratory for confirmation by GCMS.     Reporting Limits (cut-off concentrations)     Cocaine 300 ng/mL  Opiates 300 ng/mL  THC 50 ng/mL  Amphetamine 1000 ng/mL  Phencyclidine 25 ng/mL  Benzodiazepine 300 ng/mL  Barbiturates 300 ng/mL  Methadone 300 ng/mL  Oxycodone 100 ng/mL  Buprenorphine 5 ng/mL  Fentanyl 5 ng/mL     ACETAMINOPHEN LEVEL - Abnormal; Notable for the following components:    ACETAMINOPHEN LEVEL <10 (*)     All other components within normal limits   CBC WITH DIFF - Abnormal; Notable for the following components:    LYMPHOCYTE # 3.50 (*)     All other components within normal limits   MAGNESIUM - Normal   SALICYLATE ACID LEVEL - Normal   URINALYSIS, MACROSCOPIC - Normal   URINALYSIS, MICROSCOPIC  - Normal   TROPONIN-I - Normal   TROPONIN-I - Normal   TROPONIN-I - Normal   LIPASE - Normal   THYROXINE, FREE (FREE T4) - Normal   AMMONIA - Normal   PT/INR - Normal    Narrative:     INR OF 2.0-3.0  RECOMMENDED FOR: PROPHYLAXIS/TREATMENT OF VENEOUS THROMBOSIS, PULMONARY EMBOLISM, PREVENTION OF SYSTEMIC EMBOLISM FROM ATRIAL FIBRILATION, MYOCARDIAL INFARCTION.    INR OF 2.5-3.5  RECOMMENDED FOR MECHANICAL PROSTHETIC HEART VALVES, RECURRENT SYSTEMIC EMBOLISM, RECURRENT MYOCARDIAL INFARCTION.     CBC/DIFF    Narrative:     The following orders were created for panel order CBC/DIFF.  Procedure                               Abnormality         Status                     ---------                               -----------         ------                     CBC WITH ZOXW[[id number]]                Abnormal            Final result                 Please view results for these tests on the individual orders.   URINALYSIS, MACROSCOPIC AND MICROSCOPIC W/CULTURE REFLEX    Narrative:     The following orders were created for panel order URINALYSIS, MACROSCOPIC AND MICROSCOPIC W/CULTURE REFLEX.  Procedure                               Abnormality         Status                     ---------                               -----------         ------  URINALYSIS, MACROSCOPIC[[id number]]      Normal              Final result               URINALYSIS, MICROSCOPIC[[id number]]      Normal              Final result                 Please view results for these tests on the individual orders.       XR AP MOBILE CHEST   Final Result by Edi, Radresults In (09/20 1836)   NO ACUTE FINDINGS.            Radiologist location ID: [id number]             Medical Decision Making          Medical Decision Making          Studies Assessed:  Lab, EKG, radiology    EKG:   This EKG interpreted by me shows:    Rate:  127 beats per minute    Interpretation:  PR 164, No consistent ST Elevation, No Acute STEMI Identified.  Q-waves  noted.    This EKG interpreted by me shows:    Rate:  125 beats per minute    Interpretation:  PR 142, No consistent ST Elevation, No Acute STEMI Identified.  No obvious significant change from the prior EKG.          MDM Narrative:  This patient is a 52 year old male who presents with reported dental pain, however it was further noted that he was in acute alcohol withdrawal.  He drinks a 5th of vodka daily for 6 months, last drink yesterday.  Patient also complained of some chest related discomfort for which other differentials include esophagitis, gastritis, pancreatitis.  Acute coronary syndrome was also evaluated.  The patient was not have consistent ST segment elevation.  He was given 2 mg of IV Ativan in the setting of acute alcohol withdrawal which improved his symptoms.  Patient continued to be tremulous on follow-up evaluation, but not somnolent.  He was given phenobarbital IV to help with refractory alcohol withdrawal symptoms.  Case was discussed with Dr. Orson Slick for admission evaluation given the acute alcohol withdrawal the patient has high likelihood of severe withdrawal given the amount of alcohol consumed on a regular basis.        Differential includes but not limited to:  Acute alcohol withdrawal: This is likely given the patient's clinical syndrome, and lack of alcohol use since yesterday.  Acute alcohol intoxication: The patient's alcohol level is less than 80 however he still has alcohol in his system.  Metabolic abnormality: The patient was screened, and did not have significant laboratory abnormalities.  Acute coronary syndrome: Felt to be unlikely however the patient does have history of MI.      Follow-Up Discussion:  Patient was updated and agreeable.    Please see documentation above for specific labs and radiology.      Decision for High Risk/High Medical Decision Making and Treatment in the ED:  Decision of hospitalization or escalation of hospital-level care.  Parenteral Controlled  Substances             ED Course as of 10/30/22 2239   Fri Oct 30, 2022   1944 Patient is much improved on follow-up evaluation.       Critical Care Attestation:  As the ED physician, I have  provided critical care for this patient.  This patient has high probability of imminent life or limb threatening deterioration due to  findings consistent with acute alcohol withdrawal .  I provided direct patient care, documentation of findings, discussion with patient/family, and ordering benzodiazepines, and barbiturates medication for treatment of acute alcohol withdrawal, discussion with team members, the admission team  and frequent reassessment while the patient was in the ED.  Approximate minimum time spent providing critical care (exclusive of any billed procedures and/or teaching): 30 minutes.         Medications Administered in the ED   LORazepam (ATIVAN) 2 mg/mL injection (2 mg Intravenous Given 10/30/22 1807)   NS bolus infusion 2,000 mL (0 mL Intravenous Stopped 10/30/22 2008)   pantoprazole (PROTONIX) 4 mg/mL injection (40 mg Intravenous Given 10/30/22 1940)   famotidine (PEPCID) 10 mg/mL injection (20 mg Intravenous Given 10/30/22 1944)   PHENobarbital (LUMINAL) 65 mg/mL injection (130 mg Intravenous Given 10/30/22 2028)       Patient will be admitted to the  service for further workup and management.    Disposition: Admitted             Clinical Impression   Alcohol withdrawal (CMS HCC) (Primary)   Chest discomfort   Drug use         Current Discharge Medication List            /R. Tobey Bride, MD, Lacie Scotts  Department of Emergency Medicine  [facility]

## 2022-10-30 NOTE — ED Nurses Note (Signed)
Pt states they have not taken any home meds in the past few weeks. One of the meds is for Blood Pressure but he is not sure of what the name is.

## 2022-10-30 NOTE — ED Nurses Note (Signed)
Pt reports shooting up dilaudid 2 hours ago prior to coming to the ER. Pt states drinks a 5th of vodka everyday for the last 5-6 months. Pt also states he is seeing bugs at this time.

## 2022-10-30 NOTE — ED Triage Notes (Signed)
Patient c/o abcessed teeth x 1 month. Patient is also withdrawing from ETOH. Patient last drank this am.

## 2022-10-31 DIAGNOSIS — F1110 Opioid abuse, uncomplicated: Secondary | ICD-10-CM

## 2022-10-31 DIAGNOSIS — Z882 Allergy status to sulfonamides status: Secondary | ICD-10-CM

## 2022-10-31 DIAGNOSIS — K0889 Other specified disorders of teeth and supporting structures: Secondary | ICD-10-CM

## 2022-10-31 DIAGNOSIS — R441 Visual hallucinations: Secondary | ICD-10-CM

## 2022-10-31 DIAGNOSIS — I10 Essential (primary) hypertension: Secondary | ICD-10-CM

## 2022-10-31 DIAGNOSIS — K7030 Alcoholic cirrhosis of liver without ascites: Secondary | ICD-10-CM

## 2022-10-31 DIAGNOSIS — I864 Gastric varices: Secondary | ICD-10-CM

## 2022-10-31 DIAGNOSIS — Z888 Allergy status to other drugs, medicaments and biological substances status: Secondary | ICD-10-CM

## 2022-10-31 DIAGNOSIS — I8500 Esophageal varices without bleeding: Secondary | ICD-10-CM

## 2022-10-31 DIAGNOSIS — F10939 Alcohol use, unspecified with withdrawal, unspecified (CMS HCC): Secondary | ICD-10-CM

## 2022-10-31 DIAGNOSIS — I8510 Secondary esophageal varices without bleeding: Secondary | ICD-10-CM

## 2022-10-31 LAB — BASIC METABOLIC PANEL
ANION GAP: 6 mmol/L (ref 4–13)
BUN/CREA RATIO: 24 — ABNORMAL HIGH (ref 6–22)
BUN: 13 mg/dL (ref 7–25)
CALCIUM: 8.6 mg/dL (ref 8.6–10.3)
CHLORIDE: 106 mmol/L (ref 98–107)
CO2 TOTAL: 24 mmol/L (ref 21–31)
CREATININE: 0.55 mg/dL — ABNORMAL LOW (ref 0.60–1.30)
ESTIMATED GFR: 120 mL/min/{1.73_m2} (ref >59)
GLUCOSE: 92 mg/dL (ref 74–109)
OSMOLALITY, CALCULATED: 272 mOsm/kg (ref 270–290)
POTASSIUM: 3.8 mmol/L (ref 3.5–5.1)
SODIUM: 136 mmol/L (ref 136–145)

## 2022-10-31 LAB — CBC WITH DIFF
BASOPHIL #: 0 10*3/uL (ref 0.00–0.10)
BASOPHIL %: 0 % (ref 0–1)
EOSINOPHIL #: 0.2 10*3/uL (ref 0.00–0.50)
EOSINOPHIL %: 4 % (ref 1–8)
HCT: 36 % — ABNORMAL LOW (ref 36.7–47.1)
HGB: 12.1 g/dL — ABNORMAL LOW (ref 12.5–16.3)
LYMPHOCYTE #: 2.5 10*3/uL (ref 1.00–3.00)
LYMPHOCYTE %: 41 % (ref 16–44)
MCH: 30.5 pg (ref 23.8–33.4)
MCHC: 33.7 g/dL (ref 32.5–36.3)
MCV: 90.5 fL (ref 73.0–96.2)
MONOCYTE #: 0.6 10*3/uL (ref 0.30–1.00)
MONOCYTE %: 10 % (ref 5–13)
MPV: 7.6 fL (ref 7.4–11.4)
NEUTROPHIL #: 2.7 10*3/uL (ref 1.85–7.80)
NEUTROPHIL %: 45 % (ref 43–77)
PLATELETS: 165 10*3/uL (ref 140–440)
RBC: 3.98 10*6/uL — ABNORMAL LOW (ref 4.06–5.63)
RDW: 15.6 % (ref 12.1–16.2)
WBC: 6 10*3/uL (ref 3.6–10.2)

## 2022-10-31 LAB — MAGNESIUM: MAGNESIUM: 1.8 mg/dL — ABNORMAL LOW (ref 1.9–2.7)

## 2022-10-31 MED ORDER — LORAZEPAM 2 MG/ML INJECTION WRAPPER
1.0000 mg | INTRAMUSCULAR | Status: DC | PRN
Start: 2022-10-31 — End: 2022-10-31
  Administered 2022-10-31: 1 mg via INTRAVENOUS
  Filled 2022-10-31: qty 1

## 2022-10-31 MED ORDER — SODIUM CHLORIDE 0.9 % INTRAVENOUS SOLUTION
0.4000 ug/kg/h | INTRAVENOUS | Status: DC
Start: 2022-10-31 — End: 2022-10-31
  Administered 2022-10-31: 0 ug/kg/h via INTRAVENOUS
  Administered 2022-10-31: 0.4 ug/kg/h via INTRAVENOUS
  Filled 2022-10-31: qty 10

## 2022-10-31 MED ORDER — LORAZEPAM 2 MG/ML INJECTION WRAPPER
1.0000 mg | INTRAMUSCULAR | Status: DC | PRN
Start: 2022-10-31 — End: 2022-10-31
  Administered 2022-10-31 (×3): 1 mg via INTRAVENOUS
  Filled 2022-10-31 (×3): qty 1

## 2022-10-31 MED ORDER — ONDANSETRON HCL (PF) 4 MG/2 ML INJECTION SOLUTION
4.0000 mg | Freq: Three times a day (TID) | INTRAMUSCULAR | Status: DC | PRN
Start: 2022-10-31 — End: 2022-10-31
  Administered 2022-10-31: 4 mg via INTRAVENOUS

## 2022-10-31 NOTE — Care Plan (Signed)
[facility]  Care Plan Note  Patient admitted to ICU 1 from ER. Patient is admitted for ETOH WD; has been drinking approximately 1L of liquor/day for approximately 6 months. Pt states if his teeth/mouth didn't hurt so bad he probably would not be drinking as much as he does. At present patient is alert and oriented. Currently on CIWA protocol with PRN ativan. Is also on phenobarbital taper as well as Lyrica. Patient oriented to the unit on time of arrival. All questions and concerns answered and addressed. Patient education as well as transitional readiness remain ongoing at this time.       Betsy Pries, RN    Problem: Adult Inpatient Plan of Care  Goal: Plan of Care Review  Outcome: Ongoing (see interventions/notes)  Goal: Patient-Specific Goal (Individualized)  Outcome: Ongoing (see interventions/notes)  Goal: Absence of Hospital-Acquired Illness or Injury  Outcome: Ongoing (see interventions/notes)  Intervention: Identify and Manage Fall Risk  Recent Flowsheet Documentation  Taken 10/31/2022 0030 by Leane Platt, RN  Safety Promotion/Fall Prevention:   activity supervised   fall prevention program maintained   muscle strengthening facilitated   motion sensor pad activated   nonskid shoes/slippers when out of bed   safety round/check completed  Intervention: Prevent Skin Injury  Recent Flowsheet Documentation  Taken 10/31/2022 0030 by Leane Platt, RN  Body Position:   supine, head elevated   heels elevated off mattress  Intervention: Prevent and Manage VTE (Venous Thromboembolism) Risk  Recent Flowsheet Documentation  Taken 10/31/2022 0030 by Leane Platt, RN  VTE Prevention/Management:   ambulation promoted   sequential compression devices on  Goal: Optimal Comfort and Wellbeing  Outcome: Ongoing (see interventions/notes)  Intervention: Provide Person-Centered Care  Recent Flowsheet Documentation  Taken 10/31/2022 0030 by Leane Platt, RN  Trust Relationship/Rapport:   care explained   choices provided   emotional  support provided   empathic listening provided   questions answered   questions encouraged   reassurance provided   thoughts/feelings acknowledged  Goal: Rounds/Family Conference  Outcome: Ongoing (see interventions/notes)

## 2022-10-31 NOTE — Care Plan (Signed)
Patient remains alert and oriented. Has reported increased symptoms of withdrawal as shift has progressed. Has been treated per CIWA protocol. Does report some moderate hallucinations. Precedex drip has been ordered if patient patient becomes worse.   Problem: Adult Inpatient Plan of Care  Goal: Plan of Care Review  Outcome: Ongoing (see interventions/notes)  Note: Plan of care reviewed at bedside per treatment team.      Problem: Adult Inpatient Plan of Care  Goal: Patient-Specific Goal (Individualized)  Outcome: Ongoing (see interventions/notes)  Flowsheets  Taken 10/31/2022 1536  Patient-Specific Goals (Include Timeframe): Patients CIWA score has increased as shift has progressed  Plan of Care Reviewed With: patient  Taken 10/31/2022 0758  Individualized Care Needs: Monitor CIWA  Anxieties, Fears or Concerns: Anxious about withdrawls  Patient-Specific Goals (Include Timeframe): Maintain low CIWA throughout shift  Plan of Care Reviewed With: patient

## 2022-10-31 NOTE — Discharge Summary (Signed)
[facility]  DISCHARGE SUMMARY    PATIENT NAME:  Steven Parrish, Steven Parrish  MRN:  [id number]  DOB:  [birth year]    ENCOUNTER DATE:  10/30/2022  INPATIENT ADMISSION DATE: 10/30/2022  DISCHARGE DATE:  10/31/2022    ATTENDING PHYSICIAN: Nadine Counts*  SERVICE: PRN HOSPITALIST INTENSIVIST  PRIMARY CARE PHYSICIAN: Phillips Climes, DO       No lay caregiver identified.    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     Active Non-Hospital Problems    Diagnosis Date Noted    Transaminitis 04/28/2022    Abdominal pain 04/28/2022    Acute respiratory failure with hypoxia (CMS HCC) 04/28/2022    Pneumonia 03/29/2022    Alcohol abuse 03/29/2022    IV drug abuse (CMS HCC) 03/29/2022    Alcohol use with withdrawal (CMS HCC) 06/25/2021           Current Discharge Medication List        CONTINUE these medications - NO CHANGES were made during your visit.        Details   amLODIPine 5 mg Tablet  Commonly known as: NORVASC   5 mg, Oral, DAILY  Qty: 30 Tablet  Refills: 0     Ibuprofen 800 mg Tablet  Commonly known as: MOTRIN   800 mg, Oral, 4 TIMES DAILY PRN  Qty: 30 Tablet  Refills: 0     naloxone 4 mg/actuation Spray, Non-Aerosol  Commonly known as: NARCAN   1 Spray, INTRANASAL, EVERY 2 MIN PRN, for actual or suspected opioid overdose. Call 911 if used.  Qty: 2 Each  Refills: 2     omeprazole 20 mg Capsule, Delayed Release(E.C.)  Commonly known as: PRILOSEC   20 mg, DAILY  Refills: 0            Discharge med list refreshed?  YES     Allergies   Allergen Reactions    Lisinopril Angioedema    Chocolate [Cocoa]      Sulfa (Sulfonamides)  Other Adverse Reaction (Add comment)     Can't move    Triple Antibiotic [Neomy-Bacit-Polymyx-Pramoxine] Hives/ Urticaria     HOSPITAL PROCEDURE(S):   No orders of the defined types were placed in this encounter.      REASON FOR HOSPITALIZATION AND HOSPITAL COURSE   BRIEF HPI:  This is a 52 y.o., male admitted for dental pain and alcohol withdrawal symptoms  BRIEF HOSPITAL NARRATIVE:     Steven Parrish is a 52 year old male who presented to the ED 10/30/22 with dental pain and tremors. He reports history of ongoing dental pain for weeks to months. He reports drinking alcohol, at least 1L/day of vodka. He also reported to use illicit substances including IV opioids. In ED he was found to be hypertensive. He was found to have ethanol level of 56. Urine drug screen positive for fentanyl, opioids, benzodiazepines, buprenorphine, and cannabis. He was started on BZDs, phenobarbital, Librium. CIWA and COWS protocols. Admitted to ICU for monitoring. 9/21 he was felt to be  improving and well managed symptoms, without any confusion or seizure activity. He did endorse some visual hallucinations at times. Unfortunately, he felt that he did not want to stay in the hospital any longer and did not want alcohol or drug treatment at this time. He decided to leave against medical advice despite risk of worsening of his condition. He was alert and appeared oriented x 4.  TRANSITION/POST DISCHARGE CARE/PENDING TESTS/REFERRALS: none    CONDITION ON DISCHARGE:  A. Ambulation: Full ambulation  B. Self-care Ability: Complete  C. Cognitive Status Alert and Oriented x 3  D. Code status at discharge:       LINES/DRAINS/WOUNDS AT DISCHARGE:   Patient Lines/Drains/Airways Status       Active Line / Dialysis Catheter / Dialysis Graft / Drain / Airway / Wound       Name Placement date Placement time Site Days    Peripheral IV Anterior;Distal;Left;Upper Arm 10/30/22  1804  -- less than 1    Peripheral IV Left;Posterior Dorsal  Metacarpals  (top of hand) 10/31/22  1000  -- less than 1                    DISCHARGE DISPOSITION:  Home discharge and against medical advice  DISCHARGE INSTRUCTIONS:    No discharge procedures on file.       Sarita Haver, FNP-BC    Copies sent to Care Team         Relationship Specialty Notifications Start End    Phillips Climes, DO PCP - General FAMILY MEDICINE  07/12/21     Phone: [phone] Fax: [phone]         [address] [place] [address]            Referring providers can utilize [email or web address] to access their referred [facility] patient's information.

## 2022-10-31 NOTE — Nurses Notes (Signed)
Patient decided to leave against medical advice despite being informed of risks of leaving AMA. Patient verbalized understanding. Patients father was present to pick him up.

## 2022-10-31 NOTE — Transitional Care (Signed)
Steven Parrish is a 52 year old male who presented to the ED with initial complaint of dental pain and alcohol withdrawal. He reports history of alcohol abuse, drinking 1/5 gallon of vodka daily. He also uses multiple illicit substances including opiates and marijuana. He presented with the aforementioned tooth pain, as well as tremors and reported hallucinations. In ED, he was given lorazepam and phenobarbital. He was placed in ICU overnight for observation. He was started on phenobarbital taper, Librium (25 mg QID), and PRN lorazepam per CIWA protocol. He was also started on COWS protocol with PRN clonidine. He is awake and alert this AM. He is not tremulous. There are no hallucinations noted, no seizure activity, no confusion. He is felt to be stable to downgrade today for further treatment of alcohol withdrawal.

## 2022-10-31 NOTE — Progress Notes (Signed)
[place] [facility]    HOSPITALIST PROGRESS NOTE    Steven Parrish  Date of service: 10/31/2022  Date of Admission:  10/30/2022  Hospital Day:  LOS: 1 day     Subjective:   Patient seen in follow up for alcohol withdrawal, polysubstance abuse, hypertension, cirrhosis. Was started on phenobarbital taper, Librium, PRN lorazepam, PRN clonidine. Awakens to voice, alert, oriented x 4, not tremulous this AM. Labs, VS, imaging reviewed.      Vital Signs:  Filed Vitals:    10/31/22 0435 10/31/22 0500 10/31/22 0600 10/31/22 0700   BP:  (!) 152/98 (!) 145/102 (!) 154/101   Pulse:  83 85 83   Resp:  15 18 16    Temp: 37.1 C (98.8 F)   36.5 C (97.7 F)   SpO2:  95% 95% 96%        Physical Exam:  General:  Patient in NAD, resting in bed, no visitors present  Head:  Normocephalic, atraumatic  Eyes:  PERRL, anicteric sclera  ENT:  Oral mucosa moist, no nasal discharge   Neck:  Soft, supple, trachea midline  Heart:  RRR, S1 and S2 normal  Lungs:  Unlabored respirations.  Lungs are clear to auscultation bilaterally, with no wheezes, no rales, no conversational dyspnea  Abdomen:  Soft, active bowel sounds, non-tender to palpation, non-distended  Extremities:  Pulses equal bilaterally.  Capillary refill less than 3 seconds.  No edema in lower extremities bilaterally   Skin:  Warm and dry, not diaphoretic.  No ecchymosis noted.   Neuro:  A&O x 3.  No focal deficits.  Speech intact  Psych:  Cooperative, not agitated    Intake & Output:    Intake/Output Summary (Last 24 hours) at 10/31/2022 0904  Last data filed at 10/31/2022 0604  Gross per 24 hour   Intake 2618.33 ml   Output 500 ml   Net 2118.33 ml     I/O current shift:  No intake/output data recorded.  Emesis:    BM:    Date of Last Bowel Movement: 10/30/22  Heme:      acetaminophen (TYLENOL) tablet, 650 mg, Oral, Q4H PRN  aluminum-magnesium hydroxide-simethicone (MAG-AL PLUS) 200-200-20 mg per 5 mL oral liquid, 30 mL, Oral, Q4H PRN  bismuth subsalicylate  (PEPTO-BISMOL) 262 mg per 15 mL oral liquid, 30 mL, Oral, Q1H PRN  cephalexin (KEFLEX) capsule, 500 mg, Oral, 4x/day  chlordiazePOXIDE (LIBRIUM) capsule, 25 mg, Oral, 4x/day  cloNIDine (CATAPRES) tablet, 0.1 mg, Oral, Q4H PRN  folic acid (FOLVITE) tablet, 1 mg, Oral, Daily  hydrOXYzine pamoate (VISTARIL) capsule, 50 mg, Oral, Q6H PRN  loperamide (IMODIUM) capsule, 2 mg, Oral, Q4H PRN  LORazepam (ATIVAN) 2 mg/mL injection, 1 mg, Intravenous, Q4H PRN  magnesium hydroxide (MILK OF MAGNESIA) 400mg  per 5mL oral liquid, 30 mL, Oral, HS PRN  multivitamin (THERA) tablet, 1 Tablet, Oral, Daily  nicotine (NICODERM CQ) transdermal patch (mg/24 hr), 21 mg, Transdermal, Daily  NS premix infusion, , Intravenous, Continuous  pantoprazole (PROTONIX) delayed release tablet, 40 mg, Oral, Daily  PHENobarbital tablet, 97.2 mg, Oral, 3x/day   Followed by  Melene Muller ON 11/01/2022] PHENobarbital tablet, 64.8 mg, Oral, 3x/day   Followed by  Melene Muller ON 11/03/2022] PHENobarbital tablet, 32.4 mg, Oral, 3x/day  pyridOXINE Vitamin B6 tablet, 100 mg, Oral, Daily  thiamine-vitamin B1 tablet, 100 mg, Oral, Daily  traZODone (DESYREL) tablet, 50 mg, Oral, HS PRN - MR x 1          Labs:  Recent Results (  from the past 48 hour(s))   CBC WITH DIFF    Collection Time: 10/31/22  5:21 AM   Result Value    WBC 6.0    HGB 12.1 (L)    HCT 36.0 (L)    PLATELETS 165      Results for orders placed or performed during the hospital encounter of 10/30/22 (from the past 48 hour(s))   BASIC METABOLIC PANEL    Collection Time: 10/31/22  5:21 AM   Result Value    SODIUM 136    POTASSIUM 3.8    CHLORIDE 106    CO2 TOTAL 24    GLUCOSE 92    BUN 13    CREATININE 0.55 (L)      Recent Results (from the past 48 hour(s))   LIPASE    Collection Time: 10/30/22  5:46 PM   Result Value    LIPASE 47   COMPREHENSIVE METABOLIC PANEL, NON-FASTING    Collection Time: 10/30/22  5:46 PM   Result Value    ALKALINE PHOSPHATASE 154 (H)    ALT (SGPT) 47    AST (SGOT) 38      Results for orders  placed or performed during the hospital encounter of 10/30/22 (from the past 48 hour(s))   TROPONIN IN THREE HOURS    Collection Time: 10/30/22  8:38 PM   Result Value    TROPONIN I 2      Recent Results (from the past 48 hour(s))   PT/INR    Collection Time: 10/30/22  9:52 PM   Result Value    PROTHROMBIN TIME 11.3    INR 0.97      No results found for this or any previous visit (from the past 1344 hour(s)).   No results found for this or any previous visit (from the past 48 hour(s)).     Microbiology:  No results found for any visits on 10/30/22 (from the past 96 hour(s)).    Imaging:   XR AP MOBILE CHEST  Narrative: Sherle Poe Lipuma    RADIOLOGIST: Ann Maki, MD    XR AP MOBILE CHEST performed on 10/30/2022 6:33 PM    CLINICAL HISTORY: CP.  Chest pain    TECHNIQUE: Frontal view of the chest.    COMPARISON:  05/13/2022, 09/07/2021    FINDINGS:    The heart size is normal.   Mild basilar opacities suggestive of atelectasis are similar to the prior exam.  There are no acute pulmonary opacities or pleural effusions.   Impression: NO ACUTE FINDINGS.    Radiologist location ID: [id number]  ECG 12 LEAD  ** Critical Test Result: STEMI  Sinus tachycardia  Minimal voltage criteria for LVH, may be normal variant ( R in aVL )  Inferior infarct (cited on or before 15-Oct-2022)  ** ** ACUTE MI / STEMI ** **  Abnormal ECG  When compared with ECG of 22-Oct-2022 18:58,  Questionable change in QRS duration  Questionable change in initial forces of Inferior leads        Assessment/ Plan:   Active Hospital Problems   (*Primary Problem)    Diagnosis    *Alcohol withdrawal (CMS HCC)    Opiate abuse, continuous (CMS HCC)     Chronic    Cirrhosis of liver (CMS HCC)    Essential hypertension     Chronic    Esophageal and gastric varices (CMS HCC)  (CMS HCC)     Alcohol withdrawal  Polysubstance abuse  Cirrhosis  Hypertension  10/31/22- Stable on PB/Librium/PRN lorazepam. Alert, oriented x 4, no hallucinations, no tremor, no  seizure activity, no confusion. Felt to be stable to downgrade from ICU to floor for further EtOH withdrawal treatment. Repeat labs AM.    DVT Ppx: SCD  GI Ppx: PPI  Full code      Disposition Planning:  Home vs inpatient substance abuse treatment    Sarita Haver, FNP-BC  10/31/2022  [facility] MEDICINE HOSPITALIST

## 2022-11-01 ENCOUNTER — Telehealth (HOSPITAL_COMMUNITY): Payer: Self-pay

## 2022-11-01 NOTE — ED Notes (Signed)
Patient left AMA, tried calling him, no answer.

## 2022-11-02 DIAGNOSIS — R000 Tachycardia, unspecified: Secondary | ICD-10-CM

## 2022-11-02 DIAGNOSIS — I219 Acute myocardial infarction, unspecified: Secondary | ICD-10-CM

## 2022-11-02 DIAGNOSIS — R9431 Abnormal electrocardiogram [ECG] [EKG]: Secondary | ICD-10-CM

## 2022-11-02 LAB — ECG 12 LEAD
Atrial Rate: 127 {beats}/min
Calculated P Axis: 51 degrees
Calculated R Axis: 31 degrees
Calculated T Axis: 31 degrees
PR Interval: 164 ms
QRS Duration: 64 ms
QT Interval: 284 ms
QTC Calculation: 412 ms
Ventricular rate: 127 {beats}/min

## 2022-11-06 NOTE — ED Notes (Signed)
[facility] [place] [facility]  Peer Recovery Coach Assessment    Initial Evaluation  Referred by:: Nurse  Location of Evaluation: Emergency Department  How many times in the last 12 months have you been to the ED?: 6 or more                Substance Use History  Patient current substance use status: Patient tested positive for benzos, fentanyl, opiates, suboxone, and marijuana. Patient drinks a pint of vodka a day and has done so for the past 6 months due to teeth pain. Patient had also used dilaudid via IV two hours prior to arrival to ED.  Patient left AMA. PRSS followed up but patient did not want any peer support services.    Prior treatment history?: No    Currently enrolled in substance use program?: No    Within the last 30 days, what substances has the patient used?: Opiates, Marijuana, Alcohol, Other  Age of first opiate use: 53 years old  When was the patient's last use of opiates/heroin?: 0-24 hours  Patient's age at first substance use?: 21-25  Drug route of administration: Injected, Oral, Smoked    Has the patient ever had sustained abstinence?: No    Does patient want opiate use treatment?: No         Family, Social, Home & Safety History  Marital Status: Single            Need to improve relationships with family?: Yes    Social network: Substance using peers    Current living situation: Independent  Any help needed with the following?: None  Contact phone number for the patient: [phone]  Emergency contact name and phone number: Jon Gills, other, [phone]    Has the patient had any legal issues within the past 30 days?: None         Employment  Current employment status: Unemployed    Needs vocational training?: No  Needs assistance with job search?: No    Engagement  Readiness ruler: 1    Brief Intervention  Discussed plan to reduce/quit substance use?: Yes  Discussed willingness to enter treatment?: Yes  Indicated patient's stage of change:: 1 - Precontemplation    Patient  seen by Peer Recovery Coach and is a candidate for buprenorphine administration in the ED. Patient needs assessment for bup treatment.: No    Plan  Was the patient referred to treatment?: No    Was patient referred to physician for Buprenorphine Assessment in the ED?: No    Did patient receive Narcan in the ED?: No    Plan: Additional Comments: Patient declined all treatment options at this time.    Follow-up           Need for additional follow-up?: No       Harle Battiest, Peer Recovery Coach 11/06/2022 15:43

## 2022-11-27 ENCOUNTER — Emergency Department: Admission: EM | Admit: 2022-11-27 | Discharge: 2022-11-27 | Discharge status: Against Medical Advice | Payer: MEDICAID

## 2022-11-27 ENCOUNTER — Encounter (HOSPITAL_BASED_OUTPATIENT_CLINIC_OR_DEPARTMENT_OTHER): Payer: Self-pay | Admitting: Emergency Medicine

## 2022-11-27 DIAGNOSIS — Z5321 Procedure and treatment not carried out due to patient leaving prior to being seen by health care provider: Secondary | ICD-10-CM | POA: Insufficient documentation

## 2022-11-27 NOTE — ED Triage Notes (Signed)
Pt report seizure activity, upon arrival ems report Gcs 1-1-1  and snoring respiration, for several minutes, blood under tongue noted by ems      [place] ems: 20 Rt forearm, cardiac ST, BS 170

## 2022-11-27 NOTE — ED Nurses Note (Signed)
Upon the middle of triaging pt, he states he is signing out against medical advice,, pt is alert and oriented  x 4, AMA signed and pt walked out, cursing at someone on phone and telling them to come get him and that he will start walking home

## 2023-01-27 ENCOUNTER — Emergency Department
Admission: EM | Admit: 2023-01-27 | Discharge: 2023-02-10 | Disposition: E | Payer: MEDICAID | Attending: Emergency Medicine | Admitting: Emergency Medicine

## 2023-01-27 ENCOUNTER — Encounter (HOSPITAL_BASED_OUTPATIENT_CLINIC_OR_DEPARTMENT_OTHER): Payer: Self-pay

## 2023-01-27 ENCOUNTER — Other Ambulatory Visit: Payer: Self-pay

## 2023-01-27 DIAGNOSIS — I469 Cardiac arrest, cause unspecified: Secondary | ICD-10-CM | POA: Insufficient documentation

## 2023-01-27 DIAGNOSIS — I252 Old myocardial infarction: Secondary | ICD-10-CM

## 2023-01-27 DIAGNOSIS — F1010 Alcohol abuse, uncomplicated: Secondary | ICD-10-CM | POA: Insufficient documentation

## 2023-01-27 DIAGNOSIS — I4901 Ventricular fibrillation: Secondary | ICD-10-CM | POA: Insufficient documentation

## 2023-01-27 DIAGNOSIS — I10 Essential (primary) hypertension: Secondary | ICD-10-CM

## 2023-01-27 DIAGNOSIS — F17210 Nicotine dependence, cigarettes, uncomplicated: Secondary | ICD-10-CM

## 2023-01-27 MED ORDER — EPINEPHRINE 0.1 MG/ML INJECTION SYRINGE
INJECTION | Freq: Once | INTRAMUSCULAR | Status: AC | PRN
Start: 2023-01-27 — End: 2023-01-27
  Administered 2023-01-27 (×3): 1 mg via INTRAVENOUS

## 2023-01-27 MED ORDER — SODIUM BICARBONATE 8.4 % (1 MEQ/ML) INTRAVENOUS SYRINGE
INJECTION | Freq: Once | INTRAVENOUS | Status: AC | PRN
Start: 2023-01-27 — End: 2023-01-27
  Administered 2023-01-27: 50 meq via INTRAVENOUS

## 2023-01-27 MED ORDER — AMIODARONE 50 MG/ML INTRAVENOUS SOLUTION
Freq: Once | INTRAVENOUS | Status: AC | PRN
Start: 2023-01-27 — End: 2023-01-27
  Administered 2023-01-27: 300 mg via INTRAVENOUS
  Administered 2023-01-27: 150 mg via INTRAVENOUS

## 2023-01-27 MED ORDER — CALCIUM CHLORIDE 100 MG/ML (10 %) INTRAVENOUS SYRINGE
INJECTION | Freq: Once | INTRAVENOUS | Status: AC | PRN
Start: 2023-01-27 — End: 2023-01-27
  Administered 2023-01-27: 2000 mg via INTRAVENOUS

## 2023-02-10 NOTE — ED Nurses Note (Signed)
Mother states father previously made funeral arrangement, she will call back with funeral home

## 2023-02-10 NOTE — ED Nurses Note (Signed)
Patient picked up by [facility] transport at 2240.

## 2023-02-10 NOTE — ED Nurses Note (Signed)
Medical examiner here

## 2023-02-10 NOTE — ED Nurses Note (Signed)
Person whom called 911 named Steven Parrish whom states he was living with her and she states they are best friends, took belonging bag.

## 2023-02-10 NOTE — ED Nurses Note (Signed)
Patient had 2 lighters, 1 boot, $12 cash, partial pack of Marlboros and grey jogging pants. All of these items were placed in patient belonging bag.

## 2023-02-10 NOTE — ED Provider Notes (Signed)
[facility]  ED Primary Provider Note  Patient Name: Steven Parrish  Patient Age: 53 y.o.  Date of Birth: 01-11-1971    Chief Complaint: Cardiac Arrest (Ems reports pt found unresponsive by family member at 4 pm. Family member told ems that she just got home at 4 pm and found pt unresponsive.  Ems intubated pt with 7.5 mg et tube, ems found pt in asystole, gave epinephrine x 3 doses, narcan 2 mg x 1 dose, . Ems reports pt had v fib just before arrival, defibrillated x 1 in route. Iv 20 ga rt forearm blood sugar per ems in route 139)        History of Present Illness       Steven Parrish is a 53 y.o. male who had concerns including Cardiac Arrest.     HPI     Chief Complaint: Cardiac arrest  HPI:  Barriers to communication: nonresponsive.  No family present.   Informant: EMS.  Time of discovery: 1600 by wife  Last seen: This am before she went to work.  Interventions prior to arrival: 1st rhythm identified was asystole with PEA being the predominate rhythm. One run of V-fib in route with one unsuccessful Defibrillation. He was given 2mg  of Narcan without response. He was given 3 epi with the last given before entering the ER building. Accucheck was 139. He was intubated with 7.5 ETT in the field and was supported by the LUCAS machine.  Mechanism: Patient has an extensive medical history. He has a h/o MI, [place] drug abuse and alcohol abuse. Wife states he has been drinking liquor all day.   He has a h/o cirrhosis and varices.   PMH: reviewed and listed on the chart below.   SH: reviewed and listed on the chart below.   Social History: reviewed and listed on the chart below.   Family History: reviewed and listed on the chart below.       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)     Esophageal hernia     Essential hypertension     Heart attack (CMS HCC)      IV drug abuse (CMS HCC)     Peptic ulcer     PSHx:   Past Surgical History:   Procedure Laterality Date    HX CHOLECYSTECTOMY      NOSE SURGERY      SINUS SURGERY         Allergies:    Allergies   Allergen Reactions    Lisinopril Angioedema    Chocolate [Cocoa]     Sulfa (Sulfonamides)  Other Adverse Reaction (Add comment)     Can't move    Triple Antibiotic [Neomy-Bacit-Polymyx-Pramoxine] Hives/ Urticaria    Social History  Social History     Tobacco Use    Smoking status: Every Day     Current packs/day: 1.00     Types: Cigarettes    Smokeless tobacco: Former     Types: Snuff   Vaping Use    Vaping status: Never Used   Substance Use Topics    Alcohol use: Yes     Comment: 2 pints of liquor    Drug use: Not Currently     Types: Opioid     Comment: dilaudid  Family History  Family Medical History:    None            Home Meds:      Prior to Admission medications    Medication Sig Start Date End Date Taking? Authorizing Provider   amLODIPine (NORVASC) 5 mg Oral Tablet Take 1 Tablet (5 mg total) by mouth Once a day for 30 days 05/30/22 06/29/22  Sondra Come, DO   Ibuprofen (MOTRIN) 800 mg Oral Tablet Take 1 Tablet (800 mg total) by mouth Four times a day as needed for Pain  Patient not taking: Reported on 10/30/2022 06/09/22   Mamie Levers, MD   naloxone [facility]) 4 mg per spray nasal spray 1 Spray by INTRANASAL route Every 2 minutes as needed for actual or suspected opioid overdose. Call 911 if used.  Patient not taking: Reported on 04/28/2022 03/29/22   Feliberto Gottron, MD   omeprazole (PRILOSEC) 20 mg Oral Capsule, Delayed Release(E.C.) Take 1 Capsule (20 mg total) by mouth Once a day  Patient not taking: Reported on 04/28/2022    Provider, Historical          ROS: A total of 10 systems were reviewed by me at the time of the visit. All are negative except the HPI and the following.           Physical Exam   ED Triage Vitals [01/29/2023 1652]   BP    Pulse    Resp    Temperature (!) 35.1 C (95.1 F)   SpO2    Weight  94.3 kg (208 lb)   Height 1.778 m (5\' 10" )         Physical exam  Constitutional/general: The nursing notes were reviewed and agreed upon.  The vital signs were reviewed and are listed on the chart.   53 year old male who appears to be familiar to me.  I think that I have seen him in the past.  He is currently receiving CPR from the [place] machine and being bag ventilated through an ET tube.  Upon arrival to the ER, we removed the [facility] machine and started standard CPR.  We did high-quality CPR per ACLS guidelines with limited interruptions.  I  HEENT:  Pupils are fixed and dilated.  Well-hydrated oral mucosa is noted.  There is no facial trauma or abnormalities.  No dental trauma noted patient is intubated at this time.  I do not see any overt signs of trauma such as battle sign or raccoon.  No blood from the nose or the ears.  No clear fluid from the nose with the ears.  Neck:  Trachea is midline.  No soft tissue emphysema noted   Cardiovascular:  No spontaneous cardiac activity.  The patient does have a palpable pulse with CPR.  Respiratory:  Patient is currently being bag ventilated through an ET tube.  Bilateral mid axillary breath sounds are present indicating good position of the ET tube.  Condensation within the tube is also noted.    GI:  Abdomen is nondistended.  No breath sounds over the epigastrium.  No pulsatile mass noted.  No Grey flank no periumbilical discoloration.      Neuro patient is obtunded with a Glasgow coma scale of 3.  Pupils are fixed and dilated at 5 mm.    Psych:  Unobtainable at this time due to the patient's condition.  He smells strongly of alcohol   Skin:  Patient has mild cyanotic features in the dependent areas of the  bottle.  However there is no Grey flank.  There was no signs of trauma at this time.Track marks to the left dorsal forearm.     Musculoskeletal:  No long bone deformity at this time.    GU: Deferred        Procedures      Patient Data   Labs Ordered/Reviewed - No data  to display       No orders to display                Medical Decision Making  Interventions:  I did ensure that the patient had adequate ventilation and airway through the ET tube. CPR was maintained the entire time w/ limited interruptions for pulse/rhythm check.  Patient received epinephrine at standard intervals per ACLS guidelines.  Patient received 2 separate defibrillations for ventricular fibrillation.  After the 1st shock, the patient received 300 mg of amiodarone without success.  After the 2nd shock, we gave the patient 150 mg of amiodarone without success.  After this, the patient never went back into that rhythm again.  I consider the possibility of hyperkalemia and gave the patient 2 g of calcium chloride.  Additionally I felt the patient was likely acidotic so I gave him 1 amp of bicarb.  Unfortunately, to the best of my attempts as well as 1st responders, the patient did not survive this event and was pronounced dead at 17:01.    1730: It now appears the patient does not in fact have a wife.  We are unsure who this person is who found him.  However, a receipt for 200 dollars with was drawn today and he only had 12 dollars left.    1735: The male friend who found him is now here.       Problems Addressed:  Cardiac arrest (CMS [facility]): acute illness or injury  Ventricular fibrillation (CMS [facility]): acute illness or injury    Risk  Risk Details: Direct 1 on 1 critical care spent with the patient the bedside is 30 minutes.  This is excluding any separately billable procedures.    Critical Care  Total time providing critical care: 30 minutes                     Medications Administered in the ED   amiodarone (CORDARONE) 50 mg/mL injection (150 mg Intravenous Given 02/07/2023 1656)   EPINEPHrine (ADRENALIN) 0.1 mg/mL injection (1 mg Intravenous Given 01/15/2023 1658)   sodium bicarbonate 1 mEq/mL 50 mL injection (50 mEq Intravenous Given 01/21/2023 1657)   calcium chloride 100 mg/mL injection (2,000 mg Intravenous  Given 02/08/2023 1658)                 Clinical Impression   Cardiac arrest (CMS HCC) (Primary)   Ventricular fibrillation (CMS HCC)   Alcohol abuse           Expired      Current Discharge Medication List                  _______________________________  Alphonzo Cruise, D.O.  Emergency Medicine  [facility]          This note may have been partially generated using MModal Fluency Direct system, and there may be some incorrect words, spellings, and punctuation that were not noted in checking the note before saving, though effort was made to avoid such errors.

## 2023-02-10 NOTE — ED Nurses Note (Signed)
ME Case number- [id number].  Body will be taken to sent to morgue and picked up and taken to [facility] by [facility] transporter.

## 2023-02-10 NOTE — ED Notes (Signed)
2240 01/26/23 Patient picked up by [facility] transport.

## 2023-02-10 NOTE — ED Nurses Note (Signed)
At 1648 pt to er bed 8 with [employer] [employer], pt intubated and being bagged, lucus device in use, iv patent, pt is v fib on monitor, monitor out of paper so no strip obtained at this time, see code sheet, see vital sign flowsheet. Lucus device stopped per dr Katrinka Blazing and cpr began, code continues till 1700-02-20 when code/time of death called by dr Katrinka Blazing,

## 2023-02-10 NOTE — ED Nurses Note (Signed)
Core denied d/t tract marks and hx of ivda

## 2023-02-10 NOTE — Code Documentation (Signed)
Throughout the code ACLS protocols were followed, with pulse checks, continued evaluation, and modifications as necessary.

## 2023-02-10 NOTE — ED Nurses Note (Signed)
Medical examiner states they will get someone right over, contact person on chart notified.

## 2023-02-10 NOTE — ED Nurses Note (Signed)
Mother here and informed of pt passing.

## 2023-02-10 DEATH — deceased
